# Patient Record
Sex: Female | Born: 1940 | Race: White | Hispanic: No | Marital: Married | Smoking: Never smoker
Health system: Southern US, Community
[De-identification: ages and names within clinical notes are randomized; demographics above are authoritative.]

## PROBLEM LIST (undated history)

## (undated) DIAGNOSIS — I509 Heart failure, unspecified: Secondary | ICD-10-CM

## (undated) DIAGNOSIS — E119 Type 2 diabetes mellitus without complications: Secondary | ICD-10-CM

## (undated) DIAGNOSIS — C801 Malignant (primary) neoplasm, unspecified: Secondary | ICD-10-CM

## (undated) DIAGNOSIS — E782 Mixed hyperlipidemia: Secondary | ICD-10-CM

## (undated) DIAGNOSIS — E669 Obesity, unspecified: Secondary | ICD-10-CM

## (undated) DIAGNOSIS — I499 Cardiac arrhythmia, unspecified: Secondary | ICD-10-CM

## (undated) DIAGNOSIS — R911 Solitary pulmonary nodule: Secondary | ICD-10-CM

## (undated) DIAGNOSIS — K922 Gastrointestinal hemorrhage, unspecified: Secondary | ICD-10-CM

## (undated) DIAGNOSIS — I10 Essential (primary) hypertension: Secondary | ICD-10-CM

## (undated) DIAGNOSIS — I5032 Chronic diastolic (congestive) heart failure: Secondary | ICD-10-CM

## (undated) DIAGNOSIS — N183 Chronic kidney disease, stage 3 unspecified: Secondary | ICD-10-CM

## (undated) HISTORY — DX: Chronic diastolic (congestive) heart failure: I50.32

## (undated) HISTORY — DX: Chronic kidney disease, stage 3 unspecified: N18.30

## (undated) HISTORY — DX: Gastrointestinal hemorrhage, unspecified: K92.2

## (undated) HISTORY — PX: ABDOMINAL HYSTERECTOMY: SHX81

## (undated) HISTORY — DX: Mixed hyperlipidemia: E78.2

## (undated) HISTORY — DX: Obesity, unspecified: E66.9

## (undated) HISTORY — DX: Solitary pulmonary nodule: R91.1

---

## 2005-03-14 ENCOUNTER — Ambulatory Visit: Payer: Self-pay | Admitting: Family Medicine

## 2006-06-19 ENCOUNTER — Ambulatory Visit: Payer: Self-pay | Admitting: Family Medicine

## 2007-08-06 ENCOUNTER — Ambulatory Visit: Payer: Self-pay | Admitting: Family Medicine

## 2008-06-23 ENCOUNTER — Ambulatory Visit: Payer: Self-pay | Admitting: Unknown Physician Specialty

## 2008-08-06 ENCOUNTER — Ambulatory Visit: Payer: Self-pay | Admitting: Family Medicine

## 2009-09-16 ENCOUNTER — Ambulatory Visit: Payer: Self-pay | Admitting: Family Medicine

## 2010-05-27 ENCOUNTER — Ambulatory Visit: Payer: Self-pay | Admitting: Unknown Physician Specialty

## 2010-09-19 ENCOUNTER — Ambulatory Visit: Payer: Self-pay | Admitting: Family Medicine

## 2011-01-11 ENCOUNTER — Ambulatory Visit: Payer: Self-pay | Admitting: Unknown Physician Specialty

## 2011-06-13 HISTORY — PX: BREAST BIOPSY: SHX20

## 2011-10-04 ENCOUNTER — Ambulatory Visit: Payer: Self-pay | Admitting: Family Medicine

## 2011-10-17 ENCOUNTER — Ambulatory Visit: Payer: Self-pay | Admitting: Family Medicine

## 2011-10-26 ENCOUNTER — Ambulatory Visit: Payer: Self-pay | Admitting: Surgery

## 2011-11-07 ENCOUNTER — Ambulatory Visit: Payer: Self-pay | Admitting: Surgery

## 2011-11-10 ENCOUNTER — Ambulatory Visit: Payer: Self-pay | Admitting: Surgery

## 2012-05-06 ENCOUNTER — Ambulatory Visit: Payer: Self-pay | Admitting: Surgery

## 2013-05-13 ENCOUNTER — Ambulatory Visit: Payer: Self-pay | Admitting: Family Medicine

## 2013-11-28 ENCOUNTER — Emergency Department: Payer: Self-pay | Admitting: Emergency Medicine

## 2013-12-03 ENCOUNTER — Ambulatory Visit: Payer: Self-pay | Admitting: General Practice

## 2013-12-08 ENCOUNTER — Ambulatory Visit: Payer: Self-pay | Admitting: General Practice

## 2014-05-14 ENCOUNTER — Ambulatory Visit: Payer: Self-pay | Admitting: Family Medicine

## 2014-10-03 NOTE — Op Note (Signed)
PATIENT NAME:  Tammie [name], Tammie [name] MR#:  [id number] DATE OF BIRTH:  [birth year]  DATE OF PROCEDURE:  12/08/2013  PREOPERATIVE DIAGNOSIS: Left patella fracture.   POSTOPERATIVE DIAGNOSIS: Left patella fracture.   PROCEDURE PERFORMED: Open reduction internal fixation of left patella fracture using tension band wire technique.   SURGEON: Skip Estimable, MD   ANESTHESIA: General.   ESTIMATED BLOOD LOSS: 50 mL.   FLUIDS REPLACED: 950 mL of crystalloid.   DRAINS: None.   IMPLANTS UTILIZED: Two 2.0 mm K wires and a 1.25 mm wire.   INDICATIONS FOR SURGERY: The patient is a 74 year old female who fell and landed directly on the anterior aspect of her left knee. X-rays demonstrated a displaced left patella fracture. After discussion of the risks and benefits of surgical intervention, the patient expressed understanding of the risks and benefits and agreed with plans for surgical intervention.   PROCEDURE IN DETAIL: The patient was brought into the operating room and, after adequate general anesthesia was achieved, a tourniquet was placed on the patient's upper left thigh. The patient's left knee and leg were cleaned and prepped with alcohol and DuraPrep and draped in the usual sterile fashion. A "timeout" was performed as per usual protocol. The left lower extremity was exsanguinated using an Esmarch, and the tourniquet was inflated to 300 mmHg. An anterior longitudinal incision was made. Dissection was carried down to the patella. The fracture site was palpated and opened so as to evacuate hematoma. There was not gross retinacular tear either medially or laterally. Provisional reduction of the patella fracture was performed and position was maintained using bone reduction forceps with points. Congruency of the articular surface was verified using the C-arm. Two 2.0 mm K wires were inserted in an antegrade fashion. Position was confirmed using the C-arm. A 1.25 mm wire was then positioned in a  figure-of-eight fashion so as to create a tension band wire construct. Again, position was confirmed in both AP and lateral planes. Excellent maintenance of the reduction was appreciated with good congruency of the articular surface. The K wires were bent and countersunk with the distal aspect of the wires cut and bent so as to avoid soft tissue impingement. Tourniquet was deflated after total tourniquet time of 59 minutes. Hemostasis was achieved using electrocautery. The tissue over the fracture was repaired using interrupted sutures of #1 Ethibond. The subcutaneous tissue was approximated in layers using first #0 Vicryl followed by #2-0 Vicryl. Then, 30 mL of 0.25% Marcaine was injected in the subcutaneous tissue in line with the skin incision. Skin was closed with skin staples. A sterile dressing was applied followed by application of a knee brace locked in extension.   The patient tolerated the procedure well. She was transported to the recovery room in stable condition.  ____________________________ Laurice Record. Holley Bouche., MD [email or web address] D: 12/09/2013 00:52:58 ET T: 12/09/2013 07:43:02 ET JOB#: [id number]  cc: Laurice Record. Holley Bouche., MD, <Dictator> JAMES P Holley Bouche MD ELECTRONICALLY SIGNED 12/13/2013 12:26

## 2014-10-04 NOTE — Op Note (Signed)
PATIENT NAME:  Tammie [name], Tammie [name] MR#:  [id number] DATE OF BIRTH:  05/08/1941  DATE OF PROCEDURE:  11/10/2011  PREOPERATIVE DIAGNOSIS: Left breast mass.   POSTOPERATIVE DIAGNOSIS: Left breast mass.  PROCEDURE: Excision of left breast mass.   ANESTHESIA: General.   INDICATIONS: This 74 year old female recently had a mammogram and ultrasound. Her ultrasound demonstrated multiple cysts and also in the retro areola area there was a partially cystic, partially solid nodule posterior to the nipple some 6.8 x 4.6 cm in dimension, somewhat irregular and it appeared to raise some suspicion for her cancer. She had preoperative ultrasound-guided insertion of a Kopans wire. These ultrasound images were reviewed seeing that the thicker end extended through this nodule.  DESCRIPTION OF PROCEDURE: The patient was placed on the operating table in the supine position under general anesthesia. The dressing was removed from the left breast exposing the Kopans wire which entered the breast at the 3 o'clock position several centimeters lateral to the areola. The wire was cut 3 cm from the skin. The breast was prepared with ChloraPrep and draped in a sterile manner.   A curvilinear incision was made at the border of the areola from approximately 1 o'clock  to 5 o'clock position of the left breast and carried down through subcutaneous tissues and encountered the wire. Next, a sample of tissue surrounding the thick portion of the wire extending from lateral to medial was excised. This portion of tissue was approximately 1 x 1 x 2 cm in dimension. There was some firmness. There were several small cysts which were drained during the course of the dissection. The tissue was submitted in formalin for routine pathology. The wound was inspected. There was no remaining palpable hard mass within the wound. Several small bleeding points were cauterized. Hemostasis was subsequently intact. Tissues      surrounding the biopsy  site were approximated with 4-0 chromic. The skin was closed with running 4-0 chromic subcuticular suture and Dermabond. The patient tolerated surgery satisfactorily and was then prepared for transfer to the recovery room. ____________________________ Lenna Sciara. Rochel Brome, MD [email or web address] D: 11/10/2011 11:41:19 ET T: 11/10/2011 11:56:29 ET JOB#: [id number]  cc: Loreli Dollar, MD, <Dictator> Loreli Dollar MD ELECTRONICALLY SIGNED 11/10/2011 18:42

## 2015-05-12 ENCOUNTER — Other Ambulatory Visit: Payer: Self-pay | Admitting: Family Medicine

## 2015-05-12 DIAGNOSIS — Z1231 Encounter for screening mammogram for malignant neoplasm of breast: Secondary | ICD-10-CM

## 2015-05-19 ENCOUNTER — Ambulatory Visit
Admission: RE | Admit: 2015-05-19 | Discharge: 2015-05-19 | Disposition: A | Discharge status: Home / Self Care | Payer: Medicare Other | Source: Ambulatory Visit | Attending: Family Medicine | Admitting: Family Medicine

## 2015-05-19 DIAGNOSIS — Z1231 Encounter for screening mammogram for malignant neoplasm of breast: Secondary | ICD-10-CM | POA: Diagnosis present

## 2016-04-10 ENCOUNTER — Other Ambulatory Visit: Payer: Self-pay | Admitting: Family Medicine

## 2016-04-10 DIAGNOSIS — Z1231 Encounter for screening mammogram for malignant neoplasm of breast: Secondary | ICD-10-CM

## 2016-05-22 ENCOUNTER — Ambulatory Visit
Admission: RE | Admit: 2016-05-22 | Discharge: 2016-05-22 | Disposition: A | Discharge status: Home / Self Care | Payer: Medicare Other | Source: Ambulatory Visit | Attending: Family Medicine | Admitting: Family Medicine

## 2016-05-22 DIAGNOSIS — Z1231 Encounter for screening mammogram for malignant neoplasm of breast: Secondary | ICD-10-CM

## 2016-05-22 HISTORY — DX: Malignant (primary) neoplasm, unspecified: C80.1

## 2017-10-31 ENCOUNTER — Other Ambulatory Visit: Payer: Self-pay | Admitting: Family Medicine

## 2017-10-31 DIAGNOSIS — N6489 Other specified disorders of breast: Secondary | ICD-10-CM

## 2017-11-16 ENCOUNTER — Ambulatory Visit
Admission: RE | Admit: 2017-11-16 | Discharge: 2017-11-16 | Disposition: A | Discharge status: Home / Self Care | Payer: Medicare HMO | Source: Ambulatory Visit | Attending: Family Medicine | Admitting: Family Medicine

## 2017-11-16 DIAGNOSIS — N6489 Other specified disorders of breast: Secondary | ICD-10-CM | POA: Insufficient documentation

## 2018-11-18 ENCOUNTER — Other Ambulatory Visit: Payer: Self-pay | Admitting: Family Medicine

## 2018-11-18 DIAGNOSIS — Z1231 Encounter for screening mammogram for malignant neoplasm of breast: Secondary | ICD-10-CM

## 2018-12-30 ENCOUNTER — Ambulatory Visit
Admission: RE | Admit: 2018-12-30 | Discharge: 2018-12-30 | Disposition: A | Discharge status: Home / Self Care | Payer: Medicare HMO | Source: Ambulatory Visit | Attending: Family Medicine | Admitting: Family Medicine

## 2018-12-30 ENCOUNTER — Other Ambulatory Visit: Payer: Self-pay

## 2018-12-30 DIAGNOSIS — Z1231 Encounter for screening mammogram for malignant neoplasm of breast: Secondary | ICD-10-CM | POA: Diagnosis present

## 2018-12-31 ENCOUNTER — Other Ambulatory Visit: Payer: Self-pay | Admitting: Family Medicine

## 2018-12-31 DIAGNOSIS — R928 Other abnormal and inconclusive findings on diagnostic imaging of breast: Secondary | ICD-10-CM

## 2018-12-31 DIAGNOSIS — R921 Mammographic calcification found on diagnostic imaging of breast: Secondary | ICD-10-CM

## 2019-01-08 ENCOUNTER — Ambulatory Visit
Admission: RE | Admit: 2019-01-08 | Discharge: 2019-01-08 | Disposition: A | Discharge status: Home / Self Care | Payer: Medicare HMO | Source: Ambulatory Visit | Attending: Family Medicine | Admitting: Family Medicine

## 2019-01-08 DIAGNOSIS — R928 Other abnormal and inconclusive findings on diagnostic imaging of breast: Secondary | ICD-10-CM

## 2019-01-08 DIAGNOSIS — R921 Mammographic calcification found on diagnostic imaging of breast: Secondary | ICD-10-CM

## 2019-12-01 ENCOUNTER — Other Ambulatory Visit
Admission: RE | Admit: 2019-12-01 | Discharge: 2019-12-01 | Disposition: A | Discharge status: Home / Self Care | Payer: Medicare HMO | Source: Ambulatory Visit | Attending: Family Medicine | Admitting: Family Medicine

## 2019-12-01 DIAGNOSIS — R600 Localized edema: Secondary | ICD-10-CM | POA: Diagnosis not present

## 2019-12-01 DIAGNOSIS — R635 Abnormal weight gain: Secondary | ICD-10-CM | POA: Diagnosis present

## 2019-12-01 DIAGNOSIS — I10 Essential (primary) hypertension: Secondary | ICD-10-CM | POA: Diagnosis not present

## 2019-12-01 DIAGNOSIS — R079 Chest pain, unspecified: Secondary | ICD-10-CM | POA: Diagnosis not present

## 2019-12-01 LAB — BRAIN NATRIURETIC PEPTIDE: B Natriuretic Peptide: 139.2 pg/mL — ABNORMAL HIGH (ref 0.0–100.0)

## 2019-12-01 LAB — TROPONIN I (HIGH SENSITIVITY): Troponin I (High Sensitivity): 13 ng/L (ref <18)

## 2019-12-01 LAB — FIBRIN DERIVATIVES D-DIMER (ARMC ONLY): Fibrin derivatives D-dimer (ARMC): 1768.73 ng/mL (FEU) — ABNORMAL HIGH (ref 0.00–499.00)

## 2019-12-02 ENCOUNTER — Ambulatory Visit
Admission: RE | Admit: 2019-12-02 | Discharge: 2019-12-02 | Disposition: A | Discharge status: Home / Self Care | Payer: Medicare HMO | Source: Ambulatory Visit | Attending: Internal Medicine | Admitting: Internal Medicine

## 2019-12-02 ENCOUNTER — Other Ambulatory Visit: Payer: Self-pay | Admitting: Internal Medicine

## 2019-12-02 ENCOUNTER — Other Ambulatory Visit: Payer: Self-pay

## 2019-12-02 ENCOUNTER — Other Ambulatory Visit: Payer: Self-pay | Admitting: Family Medicine

## 2019-12-02 ENCOUNTER — Ambulatory Visit
Admission: RE | Admit: 2019-12-02 | Discharge: 2019-12-02 | Disposition: A | Discharge status: Home / Self Care | Payer: Medicare HMO | Source: Ambulatory Visit | Attending: Family Medicine | Admitting: Family Medicine

## 2019-12-02 ENCOUNTER — Other Ambulatory Visit (HOSPITAL_COMMUNITY): Payer: Self-pay | Admitting: Internal Medicine

## 2019-12-02 DIAGNOSIS — R635 Abnormal weight gain: Secondary | ICD-10-CM | POA: Diagnosis present

## 2019-12-02 DIAGNOSIS — R7989 Other specified abnormal findings of blood chemistry: Secondary | ICD-10-CM

## 2019-12-02 DIAGNOSIS — R600 Localized edema: Secondary | ICD-10-CM

## 2019-12-02 HISTORY — DX: Essential (primary) hypertension: I10

## 2019-12-02 HISTORY — DX: Type 2 diabetes mellitus without complications: E11.9

## 2019-12-02 MED ORDER — TECHNETIUM TO 99M ALBUMIN AGGREGATED
4.0000 | Freq: Once | INTRAVENOUS | Status: AC | PRN
  Administered 2019-12-02: 4.053 via INTRAVENOUS

## 2019-12-08 ENCOUNTER — Other Ambulatory Visit: Payer: Self-pay

## 2019-12-08 ENCOUNTER — Emergency Department: Payer: Medicare HMO

## 2019-12-08 ENCOUNTER — Encounter: Payer: Self-pay | Admitting: Emergency Medicine

## 2019-12-08 ENCOUNTER — Inpatient Hospital Stay
Admission: EM | Admit: 2019-12-08 | Discharge: 2019-12-16 | DRG: 291 | Disposition: A | Discharge status: Home Health | Payer: Medicare HMO | Attending: Internal Medicine | Admitting: Internal Medicine

## 2019-12-08 ENCOUNTER — Inpatient Hospital Stay (HOSPITAL_COMMUNITY)
Admit: 2019-12-08 | Discharge: 2019-12-08 | Disposition: A | Discharge status: Home / Self Care | Payer: Medicare HMO | Attending: Internal Medicine | Admitting: Internal Medicine

## 2019-12-08 DIAGNOSIS — Z79890 Hormone replacement therapy (postmenopausal): Secondary | ICD-10-CM

## 2019-12-08 DIAGNOSIS — L8990 Pressure ulcer of unspecified site, unspecified stage: Secondary | ICD-10-CM | POA: Diagnosis present

## 2019-12-08 DIAGNOSIS — I248 Other forms of acute ischemic heart disease: Secondary | ICD-10-CM | POA: Diagnosis present

## 2019-12-08 DIAGNOSIS — I5033 Acute on chronic diastolic (congestive) heart failure: Secondary | ICD-10-CM | POA: Diagnosis present

## 2019-12-08 DIAGNOSIS — Z20822 Contact with and (suspected) exposure to covid-19: Secondary | ICD-10-CM | POA: Diagnosis present

## 2019-12-08 DIAGNOSIS — E662 Morbid (severe) obesity with alveolar hypoventilation: Secondary | ICD-10-CM | POA: Diagnosis present

## 2019-12-08 DIAGNOSIS — R778 Other specified abnormalities of plasma proteins: Secondary | ICD-10-CM | POA: Diagnosis not present

## 2019-12-08 DIAGNOSIS — I959 Hypotension, unspecified: Secondary | ICD-10-CM | POA: Diagnosis present

## 2019-12-08 DIAGNOSIS — J9602 Acute respiratory failure with hypercapnia: Secondary | ICD-10-CM | POA: Diagnosis present

## 2019-12-08 DIAGNOSIS — I472 Ventricular tachycardia: Secondary | ICD-10-CM | POA: Diagnosis not present

## 2019-12-08 DIAGNOSIS — Z888 Allergy status to other drugs, medicaments and biological substances status: Secondary | ICD-10-CM

## 2019-12-08 DIAGNOSIS — Z880 Allergy status to penicillin: Secondary | ICD-10-CM | POA: Diagnosis not present

## 2019-12-08 DIAGNOSIS — J9601 Acute respiratory failure with hypoxia: Secondary | ICD-10-CM | POA: Diagnosis present

## 2019-12-08 DIAGNOSIS — Z6841 Body Mass Index (BMI) 40.0 and over, adult: Secondary | ICD-10-CM | POA: Diagnosis not present

## 2019-12-08 DIAGNOSIS — M19079 Primary osteoarthritis, unspecified ankle and foot: Secondary | ICD-10-CM | POA: Diagnosis present

## 2019-12-08 DIAGNOSIS — J90 Pleural effusion, not elsewhere classified: Secondary | ICD-10-CM

## 2019-12-08 DIAGNOSIS — N179 Acute kidney failure, unspecified: Secondary | ICD-10-CM | POA: Diagnosis present

## 2019-12-08 DIAGNOSIS — I110 Hypertensive heart disease with heart failure: Secondary | ICD-10-CM | POA: Diagnosis present

## 2019-12-08 DIAGNOSIS — E11649 Type 2 diabetes mellitus with hypoglycemia without coma: Secondary | ICD-10-CM | POA: Diagnosis present

## 2019-12-08 DIAGNOSIS — Z882 Allergy status to sulfonamides status: Secondary | ICD-10-CM | POA: Diagnosis not present

## 2019-12-08 DIAGNOSIS — Z7982 Long term (current) use of aspirin: Secondary | ICD-10-CM

## 2019-12-08 DIAGNOSIS — E785 Hyperlipidemia, unspecified: Secondary | ICD-10-CM | POA: Diagnosis present

## 2019-12-08 DIAGNOSIS — E119 Type 2 diabetes mellitus without complications: Secondary | ICD-10-CM

## 2019-12-08 DIAGNOSIS — J9690 Respiratory failure, unspecified, unspecified whether with hypoxia or hypercapnia: Secondary | ICD-10-CM | POA: Diagnosis present

## 2019-12-08 DIAGNOSIS — Z79899 Other long term (current) drug therapy: Secondary | ICD-10-CM | POA: Diagnosis not present

## 2019-12-08 DIAGNOSIS — I272 Pulmonary hypertension, unspecified: Secondary | ICD-10-CM | POA: Diagnosis present

## 2019-12-08 DIAGNOSIS — Z794 Long term (current) use of insulin: Secondary | ICD-10-CM | POA: Diagnosis not present

## 2019-12-08 DIAGNOSIS — I5030 Unspecified diastolic (congestive) heart failure: Secondary | ICD-10-CM | POA: Diagnosis present

## 2019-12-08 DIAGNOSIS — I361 Nonrheumatic tricuspid (valve) insufficiency: Secondary | ICD-10-CM | POA: Diagnosis not present

## 2019-12-08 DIAGNOSIS — R0689 Other abnormalities of breathing: Secondary | ICD-10-CM | POA: Diagnosis present

## 2019-12-08 DIAGNOSIS — I5023 Acute on chronic systolic (congestive) heart failure: Secondary | ICD-10-CM | POA: Diagnosis not present

## 2019-12-08 DIAGNOSIS — I10 Essential (primary) hypertension: Secondary | ICD-10-CM | POA: Diagnosis not present

## 2019-12-08 DIAGNOSIS — I071 Rheumatic tricuspid insufficiency: Secondary | ICD-10-CM | POA: Diagnosis present

## 2019-12-08 LAB — BASIC METABOLIC PANEL
Anion gap: 10 (ref 5–15)
BUN: 41 mg/dL — ABNORMAL HIGH (ref 8–23)
CO2: 40 mmol/L — ABNORMAL HIGH (ref 22–32)
Calcium: 9.2 mg/dL (ref 8.9–10.3)
Chloride: 83 mmol/L — ABNORMAL LOW (ref 98–111)
Creatinine, Ser: 1.16 mg/dL — ABNORMAL HIGH (ref 0.44–1.00)
GFR calc Af Amer: 52 mL/min — ABNORMAL LOW (ref >60)
GFR calc non Af Amer: 45 mL/min — ABNORMAL LOW (ref >60)
Glucose, Bld: 122 mg/dL — ABNORMAL HIGH (ref 70–99)
Potassium: 4.4 mmol/L (ref 3.5–5.1)
Sodium: 133 mmol/L — ABNORMAL LOW (ref 135–145)

## 2019-12-08 LAB — CBC
HCT: 40.2 % (ref 36.0–46.0)
Hemoglobin: 12 g/dL (ref 12.0–15.0)
MCH: 25.8 pg — ABNORMAL LOW (ref 26.0–34.0)
MCHC: 29.9 g/dL — ABNORMAL LOW (ref 30.0–36.0)
MCV: 86.3 fL (ref 80.0–100.0)
Platelets: 431 10*3/uL — ABNORMAL HIGH (ref 150–400)
RBC: 4.66 MIL/uL (ref 3.87–5.11)
RDW: 15 % (ref 11.5–15.5)
WBC: 10.8 10*3/uL — ABNORMAL HIGH (ref 4.0–10.5)
nRBC: 0 % (ref 0.0–0.2)

## 2019-12-08 LAB — GLUCOSE, CAPILLARY
Glucose-Capillary: 144 mg/dL — ABNORMAL HIGH (ref 70–99)
Glucose-Capillary: 44 mg/dL — CL (ref 70–99)
Glucose-Capillary: 61 mg/dL — ABNORMAL LOW (ref 70–99)
Glucose-Capillary: 95 mg/dL (ref 70–99)

## 2019-12-08 LAB — HEPATIC FUNCTION PANEL
ALT: 13 U/L (ref 0–44)
AST: 16 U/L (ref 15–41)
Albumin: 3.4 g/dL — ABNORMAL LOW (ref 3.5–5.0)
Alkaline Phosphatase: 50 U/L (ref 38–126)
Bilirubin, Direct: <0.1 mg/dL (ref 0.0–0.2)
Total Bilirubin: 0.6 mg/dL (ref 0.3–1.2)
Total Protein: 6.5 g/dL (ref 6.5–8.1)

## 2019-12-08 LAB — PROCALCITONIN: Procalcitonin: <0.1 ng/mL

## 2019-12-08 LAB — BRAIN NATRIURETIC PEPTIDE: B Natriuretic Peptide: 395.6 pg/mL — ABNORMAL HIGH (ref 0.0–100.0)

## 2019-12-08 LAB — LACTIC ACID, PLASMA: Lactic Acid, Venous: 1.9 mmol/L (ref 0.5–1.9)

## 2019-12-08 LAB — SARS CORONAVIRUS 2 BY RT PCR (HOSPITAL ORDER, PERFORMED IN ~~LOC~~ HOSPITAL LAB): SARS Coronavirus 2: NEGATIVE

## 2019-12-08 LAB — MRSA PCR SCREENING: MRSA by PCR: NEGATIVE

## 2019-12-08 LAB — TROPONIN I (HIGH SENSITIVITY)
Troponin I (High Sensitivity): 36 ng/L — ABNORMAL HIGH (ref <18)
Troponin I (High Sensitivity): 39 ng/L — ABNORMAL HIGH (ref <18)

## 2019-12-08 MED ORDER — POLYETHYLENE GLYCOL 3350 17 G PO PACK: 17.0000 g | PACK | Freq: Every day | ORAL | Status: DC | PRN

## 2019-12-08 MED ORDER — ENOXAPARIN SODIUM 40 MG/0.4ML ~~LOC~~ SOLN
40.0000 mg | Freq: Two times a day (BID) | SUBCUTANEOUS | Status: DC
  Administered 2019-12-08 – 2019-12-16 (×16): 40 mg via SUBCUTANEOUS
  Filled 2019-12-08 (×16): qty 0.4

## 2019-12-08 MED ORDER — DOCUSATE SODIUM 100 MG PO CAPS: 100.0000 mg | ORAL_CAPSULE | Freq: Two times a day (BID) | ORAL | Status: DC | PRN

## 2019-12-08 MED ORDER — METHYLPREDNISOLONE SODIUM SUCC 125 MG IJ SOLR
125.0000 mg | Freq: Once | INTRAMUSCULAR | Status: AC
  Administered 2019-12-08: 125 mg via INTRAVENOUS
  Filled 2019-12-08: qty 2

## 2019-12-08 MED ORDER — IPRATROPIUM-ALBUTEROL 0.5-2.5 (3) MG/3ML IN SOLN
3.0000 mL | Freq: Once | RESPIRATORY_TRACT | Status: AC
  Administered 2019-12-08: 3 mL via RESPIRATORY_TRACT
  Filled 2019-12-08: qty 3

## 2019-12-08 MED ORDER — SODIUM CHLORIDE 0.9 % IV SOLN: Freq: Once | INTRAVENOUS | Status: DC

## 2019-12-08 MED ORDER — IPRATROPIUM-ALBUTEROL 0.5-2.5 (3) MG/3ML IN SOLN: 3.0000 mL | Freq: Once | RESPIRATORY_TRACT | Status: DC

## 2019-12-08 MED ORDER — ONDANSETRON HCL 4 MG/2ML IJ SOLN: 4.0000 mg | Freq: Four times a day (QID) | INTRAMUSCULAR | Status: DC | PRN

## 2019-12-08 MED ORDER — FAMOTIDINE IN NACL 20-0.9 MG/50ML-% IV SOLN
20.0000 mg | Freq: Two times a day (BID) | INTRAVENOUS | Status: DC
  Administered 2019-12-08 – 2019-12-10 (×5): 20 mg via INTRAVENOUS
  Filled 2019-12-08 (×5): qty 50

## 2019-12-08 MED ORDER — SODIUM CHLORIDE 0.9 % IV SOLN
250.0000 mL | INTRAVENOUS | Status: DC | PRN
  Administered 2019-12-08 – 2019-12-14 (×3): 250 mL via INTRAVENOUS

## 2019-12-08 MED ORDER — DEXTROSE 50 % IV SOLN
1.0000 | Freq: Once | INTRAVENOUS | Status: DC
  Filled 2019-12-08: qty 50

## 2019-12-08 MED ORDER — SODIUM CHLORIDE 0.9% FLUSH: 3.0000 mL | INTRAVENOUS | Status: DC | PRN

## 2019-12-08 MED ORDER — SODIUM CHLORIDE 0.9 % IV BOLUS: 250.0000 mL | Freq: Once | INTRAVENOUS | Status: DC

## 2019-12-08 MED ORDER — DEXTROSE 50 % IV SOLN
1.0000 | Freq: Once | INTRAVENOUS | Status: AC
  Administered 2019-12-08: 50 mL via INTRAVENOUS

## 2019-12-08 MED ORDER — ACETAMINOPHEN 325 MG PO TABS: 650.0000 mg | ORAL_TABLET | ORAL | Status: DC | PRN

## 2019-12-08 MED ORDER — SODIUM CHLORIDE 0.9% FLUSH
3.0000 mL | Freq: Two times a day (BID) | INTRAVENOUS | Status: DC
  Administered 2019-12-08 – 2019-12-16 (×17): 3 mL via INTRAVENOUS

## 2019-12-08 MED ORDER — IPRATROPIUM-ALBUTEROL 0.5-2.5 (3) MG/3ML IN SOLN
3.0000 mL | Freq: Once | RESPIRATORY_TRACT | Status: DC
  Filled 2019-12-08: qty 3

## 2019-12-08 NOTE — H&P (Signed)
Name: ADYN HOES MRN: [id number] DOB: 1940-07-24     CONSULTATION DATE: 12/08/2019  REFERRING MD : Quentin Cornwall  CHIEF COMPLAINT: SOB  STUDIES:      CT chest Independently reviewed by Me today B/l effusions L>R  HISTORY OF PRESENT ILLNESS:  79 y.o. female is a ER for evaluation of 6 weeks of worsening shortness of breath and fatigue.    -Endorses diffuse swelling that has been progressively worsening.  -Denies any history of CHF.    Had recent echo that was reassuring but I dontt see ECHO results in results section She Has been placed on Lasix but does not feel that it is a helping.    Denies any fevers or chills at home.  No recent antibiotics.  States that she was having some achy chest discomfort but after being placed on oxygen in triage feels like that pain is resolved.    Not noticed any significant or sudden weight loss.  Denies any abdominal pain.  No back pain.  No dysuria.  Patient has had lower ext swelling and edema and abd distention over last several weeks, patient on biPAP for increased WOB and resp distress  CBC    Component Value Date/Time   WBC 10.8 (H) 12/08/2019 1036   RBC 4.66 12/08/2019 1036   HGB 12.0 12/08/2019 1036   HCT 40.2 12/08/2019 1036   PLT 431 (H) 12/08/2019 1036   MCV 86.3 12/08/2019 1036   MCH 25.8 (L) 12/08/2019 1036   MCHC 29.9 (L) 12/08/2019 1036   RDW 15.0 12/08/2019 1036   BMP Latest Ref Rng & Units 12/08/2019  Glucose 70 - 99 mg/dL 122(H)  BUN 8 - 23 mg/dL 41(H)  Creatinine 0.44 - 1.00 mg/dL 1.16(H)  Sodium 135 - 145 mmol/L 133(L)  Potassium 3.5 - 5.1 mmol/L 4.4  Chloride 98 - 111 mmol/L 83(L)  CO2 22 - 32 mmol/L 40(H)  Calcium 8.9 - 10.3 mg/dL 9.2   7.31/100 TROP 39 BNP 396 PRO CALCITONIN < 0.10 LA 1.9  PAST MEDICAL HISTORY :   has a past medical history of Cancer ([place]), Diabetes mellitus without complication ([place]), and Hypertension.  has a past surgical history that includes Breast biopsy (Left, 2013) and  Abdominal hysterectomy. Prior to Admission medications   Medication Sig Start Date End Date Taking? Authorizing Provider  aspirin EC 81 MG tablet Take 81 mg by mouth daily.   Yes [provider]  Calcium 600-400 MG-UNIT CHEW Chew 1 tablet by mouth 2 (two) times daily.   Yes [provider]  carvedilol (COREG) 6.25 MG tablet Take 6.25 mg by mouth 2 (two) times daily. 11/25/19  Yes [provider]  glimepiride (AMARYL) 2 MG tablet Take 2 mg by mouth daily with breakfast.   Yes [provider]  hydrochlorothiazide (HYDRODIURIL) 25 MG tablet Take 25 mg by mouth daily. 10/24/19  Yes [provider]  insulin detemir (LEVEMIR) 100 UNIT/ML injection Inject 32 Units into the skin 2 (two) times daily.   Yes [provider]  levothyroxine (SYNTHROID) 75 MCG tablet Take 75 mcg by mouth daily. 10/24/19  Yes [provider]  lisinopril (ZESTRIL) 40 MG tablet Take 40 mg by mouth daily. 10/24/19  Yes [provider]  lovastatin (MEVACOR) 20 MG tablet Take 20 mg by mouth at bedtime. 10/24/19  Yes [provider]  metFORMIN (GLUCOPHAGE) 1000 MG tablet Take 1,000 mg by mouth 2 (two) times daily. 10/24/19  Yes [provider]  naproxen sodium (ALEVE) 220  MG tablet Take 220 mg by mouth 2 (two) times daily as needed (pain).   Yes [provider]  omeprazole (PRILOSEC) 20 MG capsule Take 20 mg by mouth daily.   Yes [provider]  torsemide (DEMADEX) 20 MG tablet Take 20 mg by mouth daily. 12/04/19  Yes [provider]   Allergies  Allergen Reactions  . Ampicillin Hives  . Contrast Media [Iodinated Diagnostic Agents] Hives  . Penicillin G Hives  . Sulfamethoxazole-Trimethoprim Hives  . Tetracyclines & Related Hives  . Verapamil Hives  . Amlodipine Rash    FAMILY HISTORY:  family history includes Breast cancer in an other family member; Breast cancer (age of onset: 57) in her sister. SOCIAL HISTORY:   reports that she has never smoked. She has never used smokeless tobacco.    Review of Systems: LIMITED DUE TO RESP DISTRESS ON biPAP Other:  All other systems negative     VITAL SIGNS: Temp:  [99.7 F (37.6 C)] 99.7 F (37.6 C) (06/28 1012) Pulse Rate:  [60-68] 60 (06/28 1442) Resp:  [20-28] 24 (06/28 1442) BP: (117-150)/(62-95) 117/95 (06/28 1442) SpO2:  [87 %-98 %] 92 % (06/28 1442) FiO2 (%):  [28 %] 28 % (06/28 1442) Weight:  [100.2 kg] 100.2 kg (06/28 1013)     SpO2: 92 % O2 Flow Rate (L/min): 2 L/min FiO2 (%): 28 %  PHYSICAL EXAMINATION:  GENERAL:critically ill appearing, +resp distress on BIPAP HEAD: Normocephalic, atraumatic.  EYES: Pupils equal, round, reactive to light.  No scleral icterus.  MOUTH: Moist mucosal membrane. NECK: Supple. No thyromegaly. No nodules. No JVD.  PULMONARY: +rhonchi, CARDIOVASCULAR: S1 and S2. Regular rate and rhythm. No murmurs, rubs, or gallops.  GASTROINTESTINAL: +distended. Positive bowel sounds.  MUSCULOSKELETAL: + edema.  NEUROLOGIC: awake SKIN:intact,warm,dry     MEDICATIONS: I have reviewed all medications and confirmed regimen as documented    CULTURE RESULTS   Recent Results (from the past 240 hour(s))  SARS Coronavirus 2 by RT PCR (hospital order, performed in [facility] hospital lab) Nasopharyngeal Nasopharyngeal Swab     Status: None   Collection Time: 12/08/19 11:25 AM   Specimen: Nasopharyngeal Swab  Result Value Ref Range Status   SARS Coronavirus 2 NEGATIVE NEGATIVE Final    Comment: (NOTE) SARS-CoV-2 target nucleic acids are NOT DETECTED.  The SARS-CoV-2 RNA is generally detectable in upper and lower respiratory specimens during the acute phase of infection. The lowest concentration of SARS-CoV-2 viral copies this assay can detect is 250 copies / mL. A negative result does not preclude SARS-CoV-2 infection and should not be used as the sole basis for treatment or other patient management decisions.   A negative result may occur with improper specimen collection / handling, submission of specimen other than nasopharyngeal swab, presence of viral mutation(s) within the areas targeted by this assay, and inadequate number of viral copies (<250 copies / mL). A negative result must be combined with clinical observations, patient history, and epidemiological information.  Fact Sheet for Patients:   StrictlyIdeas.no  Fact Sheet for Healthcare Providers: BankingDealers.co.za  This test is not yet approved or  cleared by the [place] FDA and has been authorized for detection and/or diagnosis of SARS-CoV-2 by FDA under an Emergency Use Authorization (EUA).  This EUA will remain in effect (meaning this test can be used) for the duration of the COVID-19 declaration under Section 564(b)(1) of the Act, 21 U.S.C. section 360bbb-3(b)(1), unless the authorization is terminated or revoked sooner.  Performed at [facility], [address] [place], [place] [address]           IMAGING    DG Chest 2 View  Result Date: 12/08/2019 CLINICAL DATA:  Shortness of breath EXAM: CHEST - 2 VIEW COMPARISON:  December 02, 2019 FINDINGS: There is persistent airspace opacity with pleural effusion at the left base. There is a smaller right pleural effusion with ill-defined airspace opacity in the right base. Heart size and pulmonary vascularity are normal. No adenopathy. No bone lesions. IMPRESSION: Pleural effusions bilaterally, larger on the left than on the right ill-defined airspace opacity in the lung bases, more on the left than on the right. Changes on the right are new compared to most recent study. Changes on the left are similar. Stable cardiac silhouette. Electronically Signed   By: Lowella Grip III M.D.   On: 12/08/2019 11:01   CT Chest Wo Contrast  Result Date: 12/08/2019 CLINICAL DATA:  Follow-up abnormal chest x-ray EXAM: CT CHEST  WITHOUT CONTRAST TECHNIQUE: Multidetector CT imaging of the chest was performed following the standard protocol without IV contrast. COMPARISON:  Chest x-ray from earlier in the same day. FINDINGS: Cardiovascular: Somewhat limited due to lack of IV contrast. Aortic calcifications are noted without aneurysmal dilatation. Heart is at the upper limits of normal in size. Coronary calcifications are seen. Mediastinum/Nodes: Thoracic inlet is within normal limits. No hilar or mediastinal adenopathy is noted. Noted the esophagus as visualized is within normal limits. Lungs/Pleura: Bilateral pleural effusions are noted left slightly greater than right with some associated lower lobe atelectatic changes identified again left greater than right. Upper Abdomen: Visualized upper abdomen shows no acute abnormality. Musculoskeletal: Degenerative changes of the thoracic spine are noted. Chronic compression deformity at T9. IMPRESSION: Bilateral pleural effusions left greater than right with associated atelectatic changes. T9 compression deformity which appears chronic with increased sclerosis. No other focal abnormality is noted. Aortic Atherosclerosis (ICD10-I70.0). Electronically Signed   By: Inez Catalina M.D.   On: 12/08/2019 13:10       ASSESSMENT AND PLAN SYNOPSIS  79 yo WF with morbid obesity admitted for acute resp distress with acute hypercapnic resp failure with acute b/l effusions and pulm edema with generalized anasarca, at this time, I am concerned about possible acute systolic/diastolic heart failure, with probable liver failure and renal failure associated with severe hypoglycemia  Severe ACUTE Hypoxic and Hypercapnic Respiratory Failure On biPAP High risk for intubation  ACUTE SYSTOLIC CARDIAC FAILURE- ECHO pending -oxygen as needed -Lasix as tolerated -follow up cardiac enzymes as indicated  PROBABLE LIVER DYSFUNCTION CT ABD PELVIS  ACUTE KIDNEY INJURY/Renal Failure -continue Foley  Catheter-assess need -Avoid nephrotoxic agents -Follow urine output, BMP -Ensure adequate renal perfusion, optimize oxygenation -Renal dose medications   ELECTROLYTES -follow labs as needed -replace as needed -pharmacy consultation and following   DVT/GI PRX ordered and assessed TRANSFUSIONS AS NEEDED MONITOR FSBS I Assessed the need for Labs I Assessed the need for Foley I Assessed the need for Central Venous Line Family Discussion when available I Assessed the need for Mobilization I made an Assessment of medications to be adjusted accordingly Safety Risk assessment completed   Prognosis is guarded.   Critical Care Time devoted to patient care services described in this note is 45 minutes.   Overall, patient is critically ill, prognosis is guarded.     Corrin Parker, M.D.  [facility]  Medical Director [place] Director [facility] Cardio-Pulmonary Department

## 2019-12-08 NOTE — Progress Notes (Signed)
Received report from [facility] in ED, awaiting patient to come to room 14.

## 2019-12-08 NOTE — Care Management Note (Signed)
Had moment of desaturation while sleeping, increased FIO2 to 32% now saturation is at 90%

## 2019-12-08 NOTE — Progress Notes (Signed)
Patient resting quietly, requested meal earlier, but now appears to be sleeping with unlabored respirations, tolerating bipap well. Will continue to monitor

## 2019-12-08 NOTE — Progress Notes (Signed)
*  PRELIMINARY RESULTS* Echocardiogram 2D Echocardiogram has been performed.  Tammie [name] 12/08/2019, 8:48 PM

## 2019-12-08 NOTE — Progress Notes (Signed)
Anticoagulation monitoring(Lovenox):  79yo  female ordered Lovenox 40 mg Q24h  Filed Weights   12/08/19 1013  Weight: 100.2 kg (221 lb)   BMI 43.16   Lab Results  Component Value Date   CREATININE 1.16 (H) 12/08/2019   Estimated Creatinine Clearance: 41.8 mL/min (A) (by C-G formula based on SCr of 1.16 mg/dL (H)). Hemoglobin & Hematocrit     Component Value Date/Time   HGB 12.0 12/08/2019 1036   HCT 40.2 12/08/2019 1036     Per Protocol for Patient with estCrcl > 30 ml/min and BMI > 40, will transition to Lovenox 40 mg BID

## 2019-12-08 NOTE — ED Triage Notes (Signed)
Patient presents to the ED with low oxygen saturation levels at home.  Patient states they were in the 7s.  Patient reports history of problems with breathing.  Patient denies history of copd.  Patient reports history of diabetes.  Patient states she began to feel short of breath 2 weeks ago.  Patient denies fever or cough.  Patient states 1 week ago she was told she had fluid around her lung.

## 2019-12-08 NOTE — Progress Notes (Signed)
Son at bedside, took all belongings home per patient request.

## 2019-12-08 NOTE — ED Provider Notes (Signed)
[facility] Emergency Department Provider Note    First MD Initiated Contact with Patient 12/08/19 1042     (approximate)  I have reviewed the triage vital signs and the nursing notes.   HISTORY  Chief Complaint Shortness of Breath    HPI Tammie [name] is a 79 y.o. female is a ER for evaluation of 6 weeks of worsening shortness of breath and fatigue.  Endorses diffuse swelling that has been progressively worsening.  Denies any history of CHF.  Had recent echo that was reassuring.  Has been placed on Lasix but does not feel that it is a helping.  Denies any fevers or chills at home.  No recent antibiotics.  States that she was having some achy chest discomfort but after being placed on oxygen in triage feels like that pain is resolved.  Not noticed any significant or sudden weight loss.  Denies any abdominal pain.  No back pain.  No dysuria.   Echo:___________________________________________________  INTERPRETATION  NORMAL LEFT VENTRICULAR SYSTOLIC FUNCTION  NORMAL RIGHT VENTRICULAR SYSTOLIC FUNCTION  MILD VALVULAR REGURGITATION (See above)  NO VALVULAR STENOSIS     Past Medical History:  Diagnosis Date  . Cancer ([place])    skin  . Diabetes mellitus without complication ([place])   . Hypertension    Family History  Problem Relation Age of Onset  . Breast cancer Other   . Breast cancer Sister 43   Past Surgical History:  Procedure Laterality Date  . ABDOMINAL HYSTERECTOMY    . BREAST BIOPSY Left 2013   CORE W/CLIP - NEG   Patient Active Problem List   Diagnosis Date Noted  . Respiratory failure ([place]) 12/08/2019      Prior to Admission medications   Medication Sig Start Date End Date Taking? Authorizing Provider  aspirin EC 81 MG tablet Take 81 mg by mouth daily.   Yes [provider]  Calcium 600-400 MG-UNIT CHEW Chew 1 tablet by mouth 2 (two) times daily.   Yes [provider]  carvedilol (COREG) 6.25 MG tablet Take  6.25 mg by mouth 2 (two) times daily. 11/25/19  Yes [provider]  glimepiride (AMARYL) 2 MG tablet Take 2 mg by mouth daily with breakfast.   Yes [provider]  hydrochlorothiazide (HYDRODIURIL) 25 MG tablet Take 25 mg by mouth daily. 10/24/19  Yes [provider]  insulin detemir (LEVEMIR) 100 UNIT/ML injection Inject 32 Units into the skin 2 (two) times daily.   Yes [provider]  levothyroxine (SYNTHROID) 75 MCG tablet Take 75 mcg by mouth daily. 10/24/19  Yes [provider]  lisinopril (ZESTRIL) 40 MG tablet Take 40 mg by mouth daily. 10/24/19  Yes [provider]  lovastatin (MEVACOR) 20 MG tablet Take 20 mg by mouth at bedtime. 10/24/19  Yes [provider]  metFORMIN (GLUCOPHAGE) 1000 MG tablet Take 1,000 mg by mouth 2 (two) times daily. 10/24/19  Yes [provider]  naproxen sodium (ALEVE) 220 MG tablet Take 220 mg by mouth 2 (two) times daily as needed (pain).   Yes [provider]  omeprazole (PRILOSEC) 20 MG capsule Take 20 mg by mouth daily.   Yes [provider]  torsemide (DEMADEX) 20 MG tablet Take 20 mg by mouth daily. 12/04/19  Yes [provider]    Allergies Ampicillin, Contrast media [iodinated diagnostic agents], Penicillin g, Sulfamethoxazole-trimethoprim, Tetracyclines & related, Verapamil, and Amlodipine    Social History Social History   Tobacco Use  .  Smoking status: Never Smoker  . Smokeless tobacco: Never Used  Substance Use Topics  . Alcohol use: Not on file  . Drug use: Not on file    Review of Systems Patient denies headaches, rhinorrhea, blurry vision, numbness, shortness of breath, chest pain, edema, cough, abdominal pain, nausea, vomiting, diarrhea, dysuria, fevers, rashes or hallucinations unless otherwise stated above in HPI. ____________________________________________   PHYSICAL EXAM:  VITAL SIGNS: Vitals:   12/08/19 1226 12/08/19 1442  BP:  (!) 142/72 (!) 117/95  Pulse: 62 60  Resp: (!) 28 (!) 24  Temp:    SpO2: 98% 92%    Constitutional: Alert and oriented.  Eyes: Conjunctivae are normal.  Head: Atraumatic. Nose: No congestion/rhinnorhea. Mouth/Throat: Mucous membranes are moist.   Neck: No stridor. Painless ROM.  Cardiovascular: Normal rate, regular rhythm. Grossly normal heart sounds.  Good peripheral circulation. Respiratory: tachypnea, diminished posterior BS. Gastrointestinal: Soft and nontender. No distention. No abdominal bruits. No CVA tenderness. Genitourinary: deferred Musculoskeletal: No lower extremity tenderness. 2+ BLE edema.  No joint effusions. Neurologic:  Normal speech and language. No gross focal neurologic deficits are appreciated. No facial droop Skin:  Skin is warm, dry and intact. No rash noted. Psychiatric: Mood and affect are normal. Speech and behavior are normal.  ____________________________________________   LABS (all labs ordered are listed, but only abnormal results are displayed)  Results for orders placed or performed during the hospital encounter of 12/08/19 (from the past 24 hour(s))  Basic metabolic panel     Status: Abnormal   Collection Time: 12/08/19 10:36 AM  Result Value Ref Range   Sodium 133 (L) 135 - 145 mmol/L   Potassium 4.4 3.5 - 5.1 mmol/L   Chloride 83 (L) 98 - 111 mmol/L   CO2 40 (H) 22 - 32 mmol/L   Glucose, Bld 122 (H) 70 - 99 mg/dL   BUN 41 (H) 8 - 23 mg/dL   Creatinine, Ser 1.16 (H) 0.44 - 1.00 mg/dL   Calcium 9.2 8.9 - 10.3 mg/dL   GFR calc non Af Amer 45 (L) >60 mL/min   GFR calc Af Amer 52 (L) >60 mL/min   Anion gap 10 5 - 15  CBC     Status: Abnormal   Collection Time: 12/08/19 10:36 AM  Result Value Ref Range   WBC 10.8 (H) 4.0 - 10.5 K/uL   RBC 4.66 3.87 - 5.11 MIL/uL   Hemoglobin 12.0 12.0 - 15.0 g/dL   HCT 40.2 36 - 46 %   MCV 86.3 80.0 - 100.0 fL   MCH 25.8 (L) 26.0 - 34.0 pg   MCHC 29.9 (L) 30.0 - 36.0 g/dL   RDW 15.0 11.5 - 15.5 %    Platelets 431 (H) 150 - 400 K/uL   nRBC 0.0 0.0 - 0.2 %  Troponin I (High Sensitivity)     Status: Abnormal   Collection Time: 12/08/19 10:36 AM  Result Value Ref Range   Troponin I (High Sensitivity) 36 (H) <18 ng/L  Brain natriuretic peptide     Status: Abnormal   Collection Time: 12/08/19 10:36 AM  Result Value Ref Range   B Natriuretic Peptide 395.6 (H) 0.0 - 100.0 pg/mL  Hepatic function panel     Status: Abnormal   Collection Time: 12/08/19 10:36 AM  Result Value Ref Range   Total Protein 6.5 6.5 - 8.1 g/dL   Albumin 3.4 (L) 3.5 - 5.0 g/dL   AST 16 15 - 41 U/L   ALT 13 0 - 44  U/L   Alkaline Phosphatase 50 38 - 126 U/L   Total Bilirubin 0.6 0.3 - 1.2 mg/dL   Bilirubin, Direct <0.1 0.0 - 0.2 mg/dL   Indirect Bilirubin NOT CALCULATED 0.3 - 0.9 mg/dL  Procalcitonin     Status: None   Collection Time: 12/08/19 10:36 AM  Result Value Ref Range   Procalcitonin <0.10 ng/mL  Blood gas, venous     Status: Abnormal (Preliminary result)   Collection Time: 12/08/19 11:25 AM  Result Value Ref Range   pH, Ven 7.31 7.25 - 7.43   pCO2, Ven 103 (HH) 44 - 60 mmHg   pO2, Ven PENDING 32 - 45 mmHg   Bicarbonate 51.9 (H) 20.0 - 28.0 mmol/L   Acid-Base Excess 20.5 (H) 0.0 - 2.0 mmol/L   O2 Saturation 50.7 %   Patient temperature 37.0    Collection site VEIN    Sample type VEIN   SARS Coronavirus 2 by RT PCR (hospital order, performed in [place] hospital lab) Nasopharyngeal Nasopharyngeal Swab     Status: None   Collection Time: 12/08/19 11:25 AM   Specimen: Nasopharyngeal Swab  Result Value Ref Range   SARS Coronavirus 2 NEGATIVE NEGATIVE  Lactic acid, plasma     Status: None   Collection Time: 12/08/19 11:25 AM  Result Value Ref Range   Lactic Acid, Venous 1.9 0.5 - 1.9 mmol/L  Troponin I (High Sensitivity)     Status: Abnormal   Collection Time: 12/08/19 12:30 PM  Result Value Ref Range   Troponin I (High Sensitivity) 39 (H) <18 ng/L    ____________________________________________  EKG My review and personal interpretation at Time: 10:36   Indication: hypoxia  Rate: 70  Rhythm: sinus Axis: normal Other: normal intervals, no stemi ____________________________________________  RADIOLOGY  I personally reviewed all radiographic images ordered to evaluate for the above acute complaints and reviewed radiology reports and findings.  These findings were personally discussed with the patient.  Please see medical record for radiology report.  ____________________________________________   PROCEDURES  Procedure(s) performed:  .Critical Care Performed by: Merlyn Lot, MD Authorized by: Merlyn Lot, MD   Critical care provider statement:    Critical care time (minutes):  35   Critical care time was exclusive of:  Separately billable procedures and treating other patients   Critical care was necessary to treat or prevent imminent or life-threatening deterioration of the following conditions:  Respiratory failure   Critical care was time spent personally by me on the following activities:  Development of treatment plan with patient or surrogate, discussions with consultants, evaluation of patient's response to treatment, examination of patient, obtaining history from patient or surrogate, ordering and performing treatments and interventions, ordering and review of laboratory studies, ordering and review of radiographic studies, pulse oximetry, re-evaluation of patient's condition and review of old charts      Critical Care performed: yes ____________________________________________   INITIAL IMPRESSION / [place] / ED COURSE  Pertinent labs & imaging results that were available during my care of the patient were reviewed by me and considered in my medical decision making (see chart for details).   DDX: Asthma, copd, CHF, pna, ptx, malignancy, Pe, anemia   Trinaty B Ticas is a 79 y.o. who  presents to the ED with shortness of breath as described above.  Patient with proximal respiratory failure as described above requiring supplemental oxygen.  Does have diffuse edema and almost anasarca appearing but with recent echo that was reportedly normal.  Chest  x-ray bilateral effusions.  CT imaging without be ordered to evaluate for any evidence of malignancy which is on the differential.  No significant contrast allergy.  Have a lower suspicion for PE at this time.  Will check VBG she is also somewhat confused but is protecting her airway.  Clinical Course as of Dec 07 1536  Mon Dec 08, 2019  1120 Patient's troponin is mildly elevated.  Is likely secondary to demand ischemia in the setting of hypoxic respiratory failure.  She does have a contrast allergy I do want it CT scan to evaluate for these effusions a query where she has some malignancy contributing to her presentation given her age.  Seems less likely CHF given normal ultrasound and echo this past week.   [PR]  1340 Patient was significantly elevated PCO2.  Placed on BiPAP.  Will give steroid as well as additional nebs.   [PR]    Clinical Course User Index [PR] Merlyn Lot, MD    The patient was evaluated in Emergency Department today for the symptoms described in the history of present illness. He/she was evaluated in the context of the global COVID-19 pandemic, which necessitated consideration that the patient might be at risk for infection with the SARS-CoV-2 virus that causes COVID-19. Institutional protocols and algorithms that pertain to the evaluation of patients at risk for COVID-19 are in a state of rapid change based on information released by regulatory bodies including the CDC and federal and state organizations. These policies and algorithms were followed during the patient's care in the ED.  As part of my medical decision making, I reviewed the following data within the [place] notes  reviewed and incorporated, Labs reviewed, notes from prior ED visits and [place] Controlled Substance Database   ____________________________________________   FINAL CLINICAL IMPRESSION(S) / ED DIAGNOSES  Final diagnoses:  Acute respiratory failure with hypoxia and hypercapnia (HCC)      NEW MEDICATIONS STARTED DURING THIS VISIT:  New Prescriptions   No medications on file     Note:  This document was prepared using Dragon voice recognition software and may include unintentional dictation errors.    Merlyn Lot, MD 12/08/19 1538

## 2019-12-09 ENCOUNTER — Inpatient Hospital Stay: Payer: Medicare HMO

## 2019-12-09 DIAGNOSIS — I5023 Acute on chronic systolic (congestive) heart failure: Secondary | ICD-10-CM

## 2019-12-09 DIAGNOSIS — I248 Other forms of acute ischemic heart disease: Secondary | ICD-10-CM

## 2019-12-09 LAB — BASIC METABOLIC PANEL
Anion gap: 11 (ref 5–15)
BUN: 36 mg/dL — ABNORMAL HIGH (ref 8–23)
CO2: 37 mmol/L — ABNORMAL HIGH (ref 22–32)
Calcium: 8.9 mg/dL (ref 8.9–10.3)
Chloride: 85 mmol/L — ABNORMAL LOW (ref 98–111)
Creatinine, Ser: 1.03 mg/dL — ABNORMAL HIGH (ref 0.44–1.00)
GFR calc Af Amer: 60 mL/min — ABNORMAL LOW (ref >60)
GFR calc non Af Amer: 52 mL/min — ABNORMAL LOW (ref >60)
Glucose, Bld: 152 mg/dL — ABNORMAL HIGH (ref 70–99)
Potassium: 4.8 mmol/L (ref 3.5–5.1)
Sodium: 133 mmol/L — ABNORMAL LOW (ref 135–145)

## 2019-12-09 LAB — CBC
HCT: 43.2 % (ref 36.0–46.0)
Hemoglobin: 12.7 g/dL (ref 12.0–15.0)
MCH: 26 pg (ref 26.0–34.0)
MCHC: 29.4 g/dL — ABNORMAL LOW (ref 30.0–36.0)
MCV: 88.5 fL (ref 80.0–100.0)
Platelets: 418 10*3/uL — ABNORMAL HIGH (ref 150–400)
RBC: 4.88 MIL/uL (ref 3.87–5.11)
RDW: 15 % (ref 11.5–15.5)
WBC: 8.6 10*3/uL (ref 4.0–10.5)
nRBC: 0 % (ref 0.0–0.2)

## 2019-12-09 LAB — ECHOCARDIOGRAM COMPLETE
Height: 60 in
Weight: 3536 oz

## 2019-12-09 LAB — GLUCOSE, CAPILLARY
Glucose-Capillary: 39 mg/dL — CL (ref 70–99)
Glucose-Capillary: 148 mg/dL — ABNORMAL HIGH (ref 70–99)
Glucose-Capillary: 121 mg/dL — ABNORMAL HIGH (ref 70–99)
Glucose-Capillary: 130 mg/dL — ABNORMAL HIGH (ref 70–99)
Glucose-Capillary: 149 mg/dL — ABNORMAL HIGH (ref 70–99)
Glucose-Capillary: 168 mg/dL — ABNORMAL HIGH (ref 70–99)

## 2019-12-09 LAB — URINE CULTURE: Culture: NO GROWTH

## 2019-12-09 LAB — BLOOD GAS, ARTERIAL
Acid-Base Excess: 19.5 mmol/L — ABNORMAL HIGH (ref 0.0–2.0)
Bicarbonate: 49.1 mmol/L — ABNORMAL HIGH (ref 20.0–28.0)
FIO2: 0.36
O2 Saturation: 93 %
Patient temperature: 37
pCO2 arterial: 85 mmHg (ref 32.0–48.0)
pH, Arterial: 7.37 (ref 7.350–7.450)
pO2, Arterial: 69 mmHg — ABNORMAL LOW (ref 83.0–108.0)

## 2019-12-09 MED ORDER — INSULIN ASPART 100 UNIT/ML ~~LOC~~ SOLN
0.0000 [IU] | SUBCUTANEOUS | Status: DC
  Administered 2019-12-09 (×2): 2 [IU] via SUBCUTANEOUS
  Administered 2019-12-09: 4 [IU] via SUBCUTANEOUS
  Administered 2019-12-10 (×2): 2 [IU] via SUBCUTANEOUS
  Administered 2019-12-10: 20:00:00 4 [IU] via SUBCUTANEOUS
  Administered 2019-12-10 – 2019-12-11 (×3): 2 [IU] via SUBCUTANEOUS
  Administered 2019-12-11 (×2): 4 [IU] via SUBCUTANEOUS
  Administered 2019-12-11 – 2019-12-12 (×4): 2 [IU] via SUBCUTANEOUS
  Administered 2019-12-12: 8 [IU] via SUBCUTANEOUS
  Administered 2019-12-13: 09:00:00 2 [IU] via SUBCUTANEOUS
  Administered 2019-12-13: 8 [IU] via SUBCUTANEOUS
  Administered 2019-12-13: 22:00:00 4 [IU] via SUBCUTANEOUS
  Administered 2019-12-13 – 2019-12-14 (×4): 2 [IU] via SUBCUTANEOUS
  Administered 2019-12-14: 8 [IU] via SUBCUTANEOUS
  Administered 2019-12-14: 04:00:00 2 [IU] via SUBCUTANEOUS
  Administered 2019-12-14: 8 [IU] via SUBCUTANEOUS
  Administered 2019-12-14 (×2): 2 [IU] via SUBCUTANEOUS
  Administered 2019-12-15: 20:00:00 4 [IU] via SUBCUTANEOUS
  Administered 2019-12-15 (×3): 2 [IU] via SUBCUTANEOUS
  Administered 2019-12-15 – 2019-12-16 (×2): 4 [IU] via SUBCUTANEOUS
  Administered 2019-12-16: 2 [IU] via SUBCUTANEOUS
  Administered 2019-12-16: 8 [IU] via SUBCUTANEOUS
  Filled 2019-12-09 (×36): qty 1

## 2019-12-09 MED ORDER — NOREPINEPHRINE 4 MG/250ML-% IV SOLN
INTRAVENOUS | Status: AC
  Filled 2019-12-09: qty 250

## 2019-12-09 MED ORDER — CHLORHEXIDINE GLUCONATE CLOTH 2 % EX PADS
6.0000 | MEDICATED_PAD | Freq: Every day | CUTANEOUS | Status: DC
  Administered 2019-12-10 – 2019-12-12 (×3): 6 via TOPICAL

## 2019-12-09 MED ORDER — POTASSIUM CHLORIDE CRYS ER 20 MEQ PO TBCR
20.0000 meq | EXTENDED_RELEASE_TABLET | Freq: Every day | ORAL | Status: DC
  Administered 2019-12-10 – 2019-12-12 (×3): 20 meq via ORAL
  Filled 2019-12-09 (×3): qty 1

## 2019-12-09 MED ORDER — NOREPINEPHRINE 4 MG/250ML-% IV SOLN
2.0000 ug/min | INTRAVENOUS | Status: DC
  Administered 2019-12-09: 2 ug/min via INTRAVENOUS

## 2019-12-09 MED ORDER — DEXTROSE 50 % IV SOLN: 1.0000 | Freq: Every day | INTRAVENOUS | Status: DC | PRN

## 2019-12-09 MED ORDER — FUROSEMIDE 10 MG/ML IJ SOLN
80.0000 mg | Freq: Every day | INTRAMUSCULAR | Status: DC
  Administered 2019-12-09 – 2019-12-10 (×2): 80 mg via INTRAVENOUS
  Filled 2019-12-09 (×2): qty 8

## 2019-12-09 MED ORDER — TORSEMIDE 20 MG PO TABS
20.0000 mg | ORAL_TABLET | Freq: Every day | ORAL | Status: DC
  Administered 2019-12-09: 20 mg via ORAL
  Filled 2019-12-09: qty 1

## 2019-12-09 NOTE — Progress Notes (Signed)
SYNOPSIS Admitted for RESP DISTRESS, EFFUSIONS GENERALIZED ANASARCA  CC  resp failure resolving  HPI Alert and awake Following commands       VITALS: BP (!) 110/44   Pulse (!) 56   Temp 97.6 F (36.4 C) (Axillary)   Resp 15   Ht 5' (1.524 m)   Wt 100.2 kg   SpO2 95%   BMI 43.16 kg/m     I/O last 3 completed shifts: In: 163.5 [I.V.:33.3; IV Piggyback:130.2] Out: 0  No intake/output data recorded.  SpO2: 95 % O2 Flow Rate (L/min): 2 L/min FiO2 (%): 32 %  Estimated body mass index is 43.16 kg/m as calculated from the following:   Height as of this encounter: 5' (1.524 m).   Weight as of this encounter: 100.2 kg.   Review of Systems:  Gen:  Denies  fever, sweats, chills weigh loss  HEENT: Denies blurred vision, double vision, ear pain, eye pain, hearing loss, nose bleeds, sore throat Cardiac:  No dizziness, chest pain or heaviness, chest tightness,edema, No JVD Resp:   No cough, -sputum production, -shortness of breath,-wheezing, -hemoptysis,  Gi: Denies swallowing difficulty, stomach pain, nausea or vomiting, diarrhea, constipation, bowel incontinence Other:  All other systems negative   Physical Examination:   General Appearance: No distress  Neuro:without focal findings,  speech normal,  HEENT: PERRLA, EOM intact.   Pulmonary: normal breath sounds, No wheezing.  CardiovascularNormal S1,S2.  No m/r/g.   Abdomen: Benign, Soft, non-tender. Renal:  No costovertebral tenderness  GU:  Not performed at this time. Endoc: No evident thyromegaly Skin:   warm, no rashes, no ecchymosis  Extremities: +edema PSYCHIATRIC: Mood, affect within normal limits.     I personally reviewed Labs under Results section.   MEDICATIONS: I have reviewed all medications and confirmed regimen as documented   CULTURE RESULTS   Recent Results (from the past 240 hour(s))  SARS Coronavirus 2 by RT PCR (hospital order, performed in [facility] hospital lab) Nasopharyngeal  Nasopharyngeal Swab     Status: None   Collection Time: 12/08/19 11:25 AM   Specimen: Nasopharyngeal Swab  Result Value Ref Range Status   SARS Coronavirus 2 NEGATIVE NEGATIVE Final    Comment: (NOTE) SARS-CoV-2 target nucleic acids are NOT DETECTED.  The SARS-CoV-2 RNA is generally detectable in upper and lower respiratory specimens during the acute phase of infection. The lowest concentration of SARS-CoV-2 viral copies this assay can detect is 250 copies / mL. A negative result does not preclude SARS-CoV-2 infection and should not be used as the sole basis for treatment or other patient management decisions.  A negative result may occur with improper specimen collection / handling, submission of specimen other than nasopharyngeal swab, presence of viral mutation(s) within the areas targeted by this assay, and inadequate number of viral copies (<250 copies / mL). A negative result must be combined with clinical observations, patient history, and epidemiological information.  Fact Sheet for Patients:   StrictlyIdeas.no  Fact Sheet for Healthcare Providers: BankingDealers.co.za  This test is not yet approved or  cleared by the [place] FDA and has been authorized for detection and/or diagnosis of SARS-CoV-2 by FDA under an Emergency Use Authorization (EUA).  This EUA will remain in effect (meaning this test can be used) for the duration of the COVID-19 declaration under Section 564(b)(1) of the Act, 21 U.S.C. section 360bbb-3(b)(1), unless the authorization is terminated or revoked sooner.  Performed at [facility], [address], [place], [place] [address]  MRSA PCR Screening     Status: None   Collection Time: 12/08/19  4:37 PM   Specimen: Nasopharyngeal  Result Value Ref Range Status   MRSA by PCR NEGATIVE NEGATIVE Final    Comment:        The GeneXpert MRSA Assay (FDA approved for NASAL specimens only), is  one component of a comprehensive MRSA colonization surveillance program. It is not intended to diagnose MRSA infection nor to guide or monitor treatment for MRSA infections. Performed at [facility], [address] [place], [place] [address]           IMAGING    DG Chest 2 View  Result Date: 12/08/2019 CLINICAL DATA:  Shortness of breath EXAM: CHEST - 2 VIEW COMPARISON:  December 02, 2019 FINDINGS: There is persistent airspace opacity with pleural effusion at the left base. There is a smaller right pleural effusion with ill-defined airspace opacity in the right base. Heart size and pulmonary vascularity are normal. No adenopathy. No bone lesions. IMPRESSION: Pleural effusions bilaterally, larger on the left than on the right ill-defined airspace opacity in the lung bases, more on the left than on the right. Changes on the right are new compared to most recent study. Changes on the left are similar. Stable cardiac silhouette. Electronically Signed   By: Lowella Grip III M.D.   On: 12/08/2019 11:01   CT Chest Wo Contrast  Result Date: 12/08/2019 CLINICAL DATA:  Follow-up abnormal chest x-ray EXAM: CT CHEST WITHOUT CONTRAST TECHNIQUE: Multidetector CT imaging of the chest was performed following the standard protocol without IV contrast. COMPARISON:  Chest x-ray from earlier in the same day. FINDINGS: Cardiovascular: Somewhat limited due to lack of IV contrast. Aortic calcifications are noted without aneurysmal dilatation. Heart is at the upper limits of normal in size. Coronary calcifications are seen. Mediastinum/Nodes: Thoracic inlet is within normal limits. No hilar or mediastinal adenopathy is noted. Noted the esophagus as visualized is within normal limits. Lungs/Pleura: Bilateral pleural effusions are noted left slightly greater than right with some associated lower lobe atelectatic changes identified again left greater than right. Upper Abdomen: Visualized upper abdomen shows  no acute abnormality. Musculoskeletal: Degenerative changes of the thoracic spine are noted. Chronic compression deformity at T9. IMPRESSION: Bilateral pleural effusions left greater than right with associated atelectatic changes. T9 compression deformity which appears chronic with increased sclerosis. No other focal abnormality is noted. Aortic Atherosclerosis (ICD10-I70.0). Electronically Signed   By: Inez Catalina M.D.   On: 12/08/2019 13:10     CBC    Component Value Date/Time   WBC 8.6 12/09/2019 0400   RBC 4.88 12/09/2019 0400   HGB 12.7 12/09/2019 0400   HCT 43.2 12/09/2019 0400   PLT 418 (H) 12/09/2019 0400   MCV 88.5 12/09/2019 0400   MCH 26.0 12/09/2019 0400   MCHC 29.4 (L) 12/09/2019 0400   RDW 15.0 12/09/2019 0400   BMP Latest Ref Rng & Units 12/09/2019 12/08/2019  Glucose 70 - 99 mg/dL 152(H) 122(H)  BUN 8 - 23 mg/dL 36(H) 41(H)  Creatinine 0.44 - 1.00 mg/dL 1.03(H) 1.16(H)  Sodium 135 - 145 mmol/L 133(L) 133(L)  Potassium 3.5 - 5.1 mmol/L 4.8 4.4  Chloride 98 - 111 mmol/L 85(L) 83(L)  CO2 22 - 32 mmol/L 37(H) 40(H)  Calcium 8.9 - 10.3 mg/dL 8.9 9.2       ASSESSMENT AND PLAN SYNOPSIS  79 yo WF with morbid obesity admitted for acute resp distress with acute hypercapnic resp failure with acute  b/l effusions and pulm edema with generalized anasarca, at this time, I am concerned about possible acute systolic/diastolic heart failure, with probable liver failure and renal failure associated with severe hypoglycemia  resp failure- Resolved Consider lasix therapy  ECHO PENDING CT ABD PENDING  ELECTROLYTES -follow labs as needed -replace as needed -pharmacy consultation and following   DVT/GI PRX ordered and assessed TRANSFUSIONS AS NEEDED MONITOR FSBS I Assessed the need for Labs I Assessed the need for Foley I Assessed the need for Central Venous Line Family Discussion when available I Assessed the need for Mobilization I made an Assessment of medications  to be adjusted accordingly Safety Risk assessment completed  CASE DISCUSSED IN MULTIDISCIPLINARY ROUNDS WITH ICU TEAM    Heywood Tokunaga Patricia Pesa, M.D.  [facility]  Medical Director [place] Director [place] Department

## 2019-12-09 NOTE — Consult Note (Signed)
Cardiology Consultation:   Patient ID: Tammie [name]; [id number]; [birth year]   Admit date: 12/08/2019 Date of Consult: 12/09/2019  Primary Care Provider: Dion Body, MD Primary Cardiologist: New to [facility] - consult by End Primary Electrophysiologist:  None   Patient Profile:   Tammie [name] is a 79 y.o. female with a hx of insulin-dependent diabetes mellitus, hypertension, hyperlipidemia, cervical/thoracic/lumbar osteoarthritis, and obesity who is being seen today for the evaluation of acute on chronic HFpEF at the request of Dr. Mortimer Fries.  History of Present Illness:   Tammie [name] has noted an approximate 6-week history of progressive dyspnea with associated fatigue/malaise and lower extremity edema.  She indicated her weight had increased from a previous dry weight of approximately 206-208 pounds to a weight of 226 pounds over a 20-month timeframe.  She indicates that she does apply salt to some foods though does not eat out at restaurants very often.  She is uncertain if she has sleep apnea with no prior sleep study.  She denies any chest pain, palpitations, dizziness, presyncope, syncope.  She has a stable 1-2 pillow orthopnea.  She was evaluated at her PCPs office earlier this month with chest x-ray on 6/22 showing a small to moderate left pleural effusion.  Echo on 6/21, showed an EF greater than 81%, grade 2 diastolic dysfunction, no regional wall motion abnormalities, normal RV systolic function, trivial aortic insufficiency , mitral regurgitation/tricuspid regurgitation/pulmonic regurgitation.  VQ scan on 6/22 low probability for PE.  Lower extremity ultrasound negative for DVT bilaterally.  BNP mildly elevated at 139.  She was started on Lasix 20 mg daily at that time.  At follow-up visit with PCP on 6/28 she was documented to be hypoxic at 71% on room air with a documented weight of 221 pounds.  In the setting she was transferred to the [facility] ED for further  evaluation.  BP has ranged from 15-7 50 systolic with heart rate 55 to 68 bpm.  Documented weight 221 pounds.  Labs showed a BNP of 395, initial high-sensitivity troponin 36 with a delta of 39.  Covid negative.  BUN/SCr 41/1.16 trending to 36/1.03.  Albumin 3.4.  AST/ALT normal.  WBC 10.8 trending to 8.6 with Hgb 12.0.  Repeat chest x-ray showed pleural effusions bilaterally with the left being greater than the right with right-sided changes being new compared to prior study.  CT chest showed bilateral pleural effusions with the left being greater than the right with associated atelectatic change as well as likely chronic T9 compression deformity and aortic atherosclerosis.  CT abdomen and pelvis demonstrated no acute finding within the abdomen or pelvis with bilateral kidney cysts that were incompletely characterized, gallstones, small to moderate bilateral pleural effusions with left lower lobe atelectasis/consolidation, anasarca, and aortic atherosclerosis.  Echo on 6/28 showed an EF of 60 to 65%, no regional wall motion masses, mild LVH, grade 2 diastolic dysfunction, mild to moderately elevated RVSP at 35 mmHg plus CVP, normal RV systolic function with mildly enlarged RV cavity size, degenerative mitral valve with trace regurgitation, mild to moderate tricuspid regurgitation.  She previously required vasopressor support with Levophed though this has subsequently been tapered off.  Briefly treated with BiPAP, now she remains on supplemental oxygen via nasal cannula.  At baseline she is not on supplemental oxygen.  She has been continued on PTA torsemide 20 mg daily with minimal documented urine output.  In seeing the patient this afternoon she does feel like her dyspnea is a little bit  improved though not at baseline.    Past Medical History:  Diagnosis Date  . Cancer ([place])    skin  . Diabetes mellitus without complication ([place])   . Hypertension     Past Surgical History:  Procedure Laterality  Date  . ABDOMINAL HYSTERECTOMY    . BREAST BIOPSY Left 2013   CORE W/CLIP - NEG     Home Meds: Prior to Admission medications   Medication Sig Start Date End Date Taking? Authorizing Provider  aspirin EC 81 MG tablet Take 81 mg by mouth daily.   Yes [provider]  Calcium 600-400 MG-UNIT CHEW Chew 1 tablet by mouth 2 (two) times daily.   Yes [provider]  carvedilol (COREG) 6.25 MG tablet Take 6.25 mg by mouth 2 (two) times daily. 11/25/19  Yes [provider]  glimepiride (AMARYL) 2 MG tablet Take 2 mg by mouth daily with breakfast.   Yes [provider]  hydrochlorothiazide (HYDRODIURIL) 25 MG tablet Take 25 mg by mouth daily. 10/24/19  Yes [provider]  insulin detemir (LEVEMIR) 100 UNIT/ML injection Inject 32 Units into the skin 2 (two) times daily.   Yes [provider]  levothyroxine (SYNTHROID) 75 MCG tablet Take 75 mcg by mouth daily. 10/24/19  Yes [provider]  lisinopril (ZESTRIL) 40 MG tablet Take 40 mg by mouth daily. 10/24/19  Yes [provider]  lovastatin (MEVACOR) 20 MG tablet Take 20 mg by mouth at bedtime. 10/24/19  Yes [provider]  metFORMIN (GLUCOPHAGE) 1000 MG tablet Take 1,000 mg by mouth 2 (two) times daily. 10/24/19  Yes [provider]  naproxen sodium (ALEVE) 220 MG tablet Take 220 mg by mouth 2 (two) times daily as needed (pain).   Yes [provider]  omeprazole (PRILOSEC) 20 MG capsule Take 20 mg by mouth daily.   Yes [provider]  torsemide (DEMADEX) 20 MG tablet Take 20 mg by mouth daily. 12/04/19  Yes [provider]    Inpatient Medications: Scheduled Meds: . Chlorhexidine Gluconate Cloth  6 each Topical Daily  . enoxaparin (LOVENOX) injection  40 mg Subcutaneous BID  . insulin aspart  0-24 Units Subcutaneous Q4H  . ipratropium-albuterol  3 mL Nebulization Once  . sodium chloride flush  3 mL Intravenous Q12H  . torsemide  20  mg Oral Daily   Continuous Infusions: . sodium chloride 250 mL (12/09/19 1046)  . famotidine (PEPCID) IV 20 mg (12/09/19 1048)  . norepinephrine Stopped (12/09/19 0930)  . norepinephrine (LEVOPHED) Adult infusion Stopped (12/09/19 0930)   PRN Meds: sodium chloride, acetaminophen, dextrose, docusate sodium, ondansetron (ZOFRAN) IV, polyethylene glycol, sodium chloride flush  Allergies:   Allergies  Allergen Reactions  . Ampicillin Hives  . Contrast Media [Iodinated Diagnostic Agents] Hives  . Penicillin G Hives  . Sulfamethoxazole-Trimethoprim Hives  . Tetracyclines & Related Hives  . Verapamil Hives  . Amlodipine Rash    Social History:   Social History   Socioeconomic History  . Marital status: Married    Spouse name: Not on file  . Number of children: Not on file  . Years of education: Not on file  . Highest education level: Not on file  Occupational History  . Not on file  Tobacco Use  . Smoking status: Never Smoker  . Smokeless tobacco: Never Used  Substance and Sexual Activity  . Alcohol use: Not on file  . Drug use: Not on file  . Sexual activity: Not on file  Other  Topics Concern  . Not on file  Social History Narrative  . Not on file   Social Determinants of Health   Financial Resource Strain:   . Difficulty of Paying Living Expenses:   Food Insecurity:   . Worried About Charity fundraiser in the Last Year:   . Arboriculturist in the Last Year:   Transportation Needs:   . Film/video editor (Medical):   [name] Lack of Transportation (Non-Medical):   Physical Activity:   . Days of Exercise per Week:   . Minutes of Exercise per Session:   Stress:   . Feeling of Stress :   Social Connections:   . Frequency of Communication with Friends and Family:   . Frequency of Social Gatherings with Friends and Family:   . Attends Religious Services:   . Active Member of Clubs or Organizations:   . Attends Archivist Meetings:   [name] Marital Status:     Intimate Partner Violence:   . Fear of Current or Ex-Partner:   . Emotionally Abused:   [name] Physically Abused:   . Sexually Abused:      Family History:   Family History  Problem Relation Age of Onset  . Breast cancer Other   . Breast cancer Sister 50    ROS:  Review of Systems  Constitutional: Positive for malaise/fatigue. Negative for chills, diaphoresis, fever and weight loss.  HENT: Negative for congestion.   Eyes: Negative for discharge and redness.  Respiratory: Positive for shortness of breath. Negative for cough, sputum production and wheezing.   Cardiovascular: Positive for orthopnea and leg swelling. Negative for chest pain, palpitations, claudication and PND.  Gastrointestinal: Negative for abdominal pain, heartburn, nausea and vomiting.  Musculoskeletal: Negative for falls and myalgias.  Skin: Negative for rash.  Neurological: Positive for weakness. Negative for dizziness, tingling, tremors, sensory change, speech change, focal weakness and loss of consciousness.  Endo/Heme/Allergies: Does not bruise/bleed easily.  Psychiatric/Behavioral: Negative for substance abuse. The patient is not nervous/anxious.   All other systems reviewed and are negative.     Physical Exam/Data:   Vitals:   12/09/19 0600 12/09/19 0615 12/09/19 0630 12/09/19 0750  BP: (!) 103/40 (!) 104/44 (!) 110/44   Pulse: (!) 56 (!) 58 (!) 56   Resp: 17 15 15    Temp:    97.6 F (36.4 C)  TempSrc:    Axillary  SpO2: 94% 94% 95%   Weight:      Height:        Intake/Output Summary (Last 24 hours) at 12/09/2019 1310 Last data filed at 12/09/2019 0600 Gross per 24 hour  Intake 163.48 ml  Output 0 ml  Net 163.48 ml   Filed Weights   12/08/19 1013  Weight: 100.2 kg   Body mass index is 43.16 kg/m.   Physical Exam: General: Well developed, well nourished, in no acute distress. Head: Normocephalic, atraumatic, sclera non-icteric, no xanthomas, nares without discharge.  Neck: Negative for  carotid bruits. JVD difficult to assess secondary to cervical spine arthritis with limited mobility. Lungs: Diminished breath sounds along the bilateral bases. Breathing is unlabored. Heart: RRR with S1 S2. No murmurs, rubs, or gallops appreciated. Abdomen: Soft, non-tender, non-distended with normoactive bowel sounds. No hepatomegaly. No rebound/guarding. No obvious abdominal masses. Msk:  Strength and tone appear normal for age. Extremities: No clubbing or cyanosis.  1+ bilateral lower extremity pitting edema. Distal pedal pulses are 2+ and equal bilaterally. Neuro: Alert and oriented X 3.  No facial asymmetry. No focal deficit. Moves all extremities spontaneously. Psych:  Responds to questions appropriately with a normal affect.   EKG:  The EKG was personally reviewed and demonstrates: NSR, 68 bpm, right axis deviation, no acute ST-T changes Telemetry:  Telemetry was personally reviewed and demonstrates: SR with occasional PACs  Weights: Filed Weights   12/08/19 1013  Weight: 100.2 kg    Relevant CV Studies:  2D echo 12/01/2019 performed at [facility]: INTERPRETATION  NORMAL LEFT VENTRICULAR SYSTOLIC FUNCTION  NORMAL RIGHT VENTRICULAR SYSTOLIC FUNCTION  MILD VALVULAR REGURGITATION (See above)  NO VALVULAR STENOSIS  ECHOCARDIOGRAPHIC DESCRIPTIONS  AORTIC ROOT          Size: Normal       Dissection: INDETERM FOR DISSECTION  AORTIC VALVE        Leaflets: Tricuspid          Morphology: Normal        Mobility: Fully mobile  LEFT VENTRICLE          Size: Normal            Anterior: Normal      Contraction: Normal             Lateral: Normal       Closest EF: >55% (Estimated)        Septal: Normal       LV Masses: No Masses            Apical: Normal          LVH: None             Inferior: Normal                            Posterior: Normal      Dias.FxClass: (Grade 2) relaxation abnormal, pseudonormal  MITRAL VALVE        Leaflets: Normal            Mobility: Fully mobile       Morphology: Normal  LEFT ATRIUM          Size: MILDLY ENLARGED       LA Masses: No masses       IA Septum: Normal IAS  MAIN PA          Size: Normal  PULMONIC VALVE       Morphology: Normal            Mobility: Fully mobile  RIGHT VENTRICLE       RV Masses: No Masses             Size: Normal       Free Wall: Normal          Contraction: Normal  TRICUSPID VALVE        Leaflets: Normal            Mobility: Fully mobile       Morphology: Normal  RIGHT ATRIUM          Size: MILDLY ENLARGED        RA Other: None        RA Mass: No masses  PERICARDIUM         Fluid: No effusion  INFERIOR VENACAVA          Size: Normal Normal respiratory collapse    __________  2D echo 12/08/2019: 1. Left ventricular ejection fraction, by estimation, is 60 to 65%. The  left ventricle has normal function. The left  ventricle has no regional  wall motion abnormalities. There is mild left ventricular hypertrophy.  Left ventricular diastolic parameters  are consistent with Grade II diastolic dysfunction (pseudonormalization).  Elevated left atrial pressure.  2. Pulmonary artery pressure is mildly to moderately elevated (RVSP 35  mmHg plus central venous pressure). Right ventricular systolic function is  normal. The right ventricular size is mildly enlarged. Mildly increased  right ventricular wall thickness.  3. The mitral valve is degenerative. Trivial mitral valve regurgitation.  No evidence of mitral stenosis.  4. Tricuspid valve regurgitation is mild to moderate.  5. The aortic valve was not well visualized. Aortic valve regurgitation  is not visualized. No  aortic stenosis is present.  Laboratory Data:  Chemistry Recent Labs  Lab 12/08/19 1036 12/09/19 0400  NA 133* 133*  K 4.4 4.8  CL 83* 85*  CO2 40* 37*  GLUCOSE 122* 152*  BUN 41* 36*  CREATININE 1.16* 1.03*  CALCIUM 9.2 8.9  GFRNONAA 45* 52*  GFRAA 52* 60*  ANIONGAP 10 11    Recent Labs  Lab 12/08/19 1036  PROT 6.5  ALBUMIN 3.4*  AST 16  ALT 13  ALKPHOS 50  BILITOT 0.6   Hematology Recent Labs  Lab 12/08/19 1036 12/09/19 0400  WBC 10.8* 8.6  RBC 4.66 4.88  HGB 12.0 12.7  HCT 40.2 43.2  MCV 86.3 88.5  MCH 25.8* 26.0  MCHC 29.9* 29.4*  RDW 15.0 15.0  PLT 431* 418*   Cardiac EnzymesNo results for input(s): TROPONINI in the last 168 hours. No results for input(s): TROPIPOC in the last 168 hours.  BNP Recent Labs  Lab 12/08/19 1036  BNP 395.6*    DDimer No results for input(s): DDIMER in the last 168 hours.  Radiology/Studies:  CT ABDOMEN PELVIS WO CONTRAST  Result Date: 12/09/2019 IMPRESSION: 1. No acute findings identified within the abdomen or pelvis. 2. Bilateral kidney cysts. Incompletely characterized without IV contrast. 3. Gallstones. 4. Small to moderate bilateral pleural effusions with left lower lobe atelectasis/consolidation. 5. Anasarca. 6. Aortic atherosclerosis. Aortic Atherosclerosis (ICD10-I70.0). Electronically Signed   By: Kerby Moors M.D.   On: 12/09/2019 09:31   DG Chest 2 View  Result Date: 12/08/2019 IMPRESSION: Pleural effusions bilaterally, larger on the left than on the right ill-defined airspace opacity in the lung bases, more on the left than on the right. Changes on the right are new compared to most recent study. Changes on the left are similar. Stable cardiac silhouette. Electronically Signed   By: Lowella Grip III M.D.   On: 12/08/2019 11:01   CT Chest Wo Contrast  Result Date: 12/08/2019 IMPRESSION: Bilateral pleural effusions left greater than right with associated atelectatic changes. T9 compression deformity  which appears chronic with increased sclerosis. No other focal abnormality is noted. Aortic Atherosclerosis (ICD10-I70.0). Electronically Signed   By: Inez Catalina M.D.   On: 12/08/2019 13:10     Assessment and Plan:   1.  Acute on chronic HFpEF/pulmonary hypertension: -She remains volume overloaded the patient reported dry weight of approximately 206-208 pounds with a current weight of 226 pounds -Likely secondary to obesity with likely OSA and OHS -Discontinue PTA torsemide -Start IV Lasix 80 mg daily with KCl repletion -If patient becomes hypotensive with this, may need low-dose Levophed -Patient will need outpatient sleep study -CHF education -Daily weights -Strict ins and outs  2.  Elevated troponin: -Never with symptoms of chest pain -Minimally elevated likely secondary to supply demand ischemia in the setting of volume overload  with mild AKI -No indication for heparin drip, not consistent with ACS -ASA -Consider outpatient functional study to assess for high risk ischemia  3.  AKI: -Improving -Monitor with diuresis  4.  HTN: -Blood pressure currently reasonably controlled -Diuresis as above   For questions or updates, please contact [place] Please consult [email or web address] for contact info under Cardiology/STEMI.   Signed, Christell Faith, PA-C [place] Pager: [phone] 12/09/2019, 1:10 PM

## 2019-12-09 NOTE — Plan of Care (Signed)
Nutrition Education Note  RD consulted for nutrition education regarding CHF and DM.  Met with patient and her son at bedside. Patient reports she has a good appetite now and at baseline. She reports she typically eats 2-3 meals per day. She reports she may have fried chicken, mashed potatoes, beans, and other vegetables. She also occasionally has hotdogs. Patient drinks Pepsi, sweet tea, orange juice, milk, and water. She reports she limits other sweets in her diet. Encouraged patient to reduce intake of sugar-sweetened beverages. She reports she has not previously learned about low-sodium diet. Reviewed ways to decrease sodium in patient's diet. Encouraged patient to weigh herself daily.  RD provided "Low Sodium Nutrition Therapy" handout from the Academy of [employer] and [employer]etics. Reviewed patient's dietary recall. Provided examples on ways to decrease sodium intake in diet. Discouraged intake of processed foods and use of salt shaker. Encouraged fresh fruits and vegetables as well as whole grain sources of carbohydrates to maximize fiber intake.   RD discussed why it is important for patient to adhere to diet recommendations, and emphasized the role of fluids, foods to avoid, and importance of weighing self daily. Teach back method used.  No results found for: HGBA1C  RD provided "Carbohydrate Counting for People with Diabetes" handout from the Academy of Nutrition and Dietetics. Discussed different food groups and their effects on blood sugar, emphasizing carbohydrate-containing foods. Provided list of carbohydrates and recommended serving sizes of common foods.  Discussed importance of controlled and consistent carbohydrate intake throughout the day. Provided examples of ways to balance meals/snacks and encouraged intake of high-fiber, whole grain complex carbohydrates. Teach back method used.  Expect fair to good compliance.  Body mass index is 43.16 kg/m. Pt meets criteria for obesity  class III based on current BMI.  Current diet order is heart healthy/carbohydrate modified, patient is consuming approximately 100% of meals at this time. Labs and medications reviewed. No further nutrition interventions warranted at this time. RD contact information provided. If additional nutrition issues arise, please re-consult RD.  Jacklynn Barnacle, MS, RD, LDN Pager number available on Amion

## 2019-12-10 DIAGNOSIS — I5030 Unspecified diastolic (congestive) heart failure: Secondary | ICD-10-CM | POA: Diagnosis present

## 2019-12-10 DIAGNOSIS — I10 Essential (primary) hypertension: Secondary | ICD-10-CM | POA: Diagnosis present

## 2019-12-10 DIAGNOSIS — I5033 Acute on chronic diastolic (congestive) heart failure: Secondary | ICD-10-CM | POA: Diagnosis present

## 2019-12-10 DIAGNOSIS — E119 Type 2 diabetes mellitus without complications: Secondary | ICD-10-CM

## 2019-12-10 DIAGNOSIS — R778 Other specified abnormalities of plasma proteins: Secondary | ICD-10-CM | POA: Diagnosis present

## 2019-12-10 LAB — BASIC METABOLIC PANEL
Anion gap: 11 (ref 5–15)
BUN: 43 mg/dL — ABNORMAL HIGH (ref 8–23)
CO2: 42 mmol/L — ABNORMAL HIGH (ref 22–32)
Calcium: 8.9 mg/dL (ref 8.9–10.3)
Chloride: 86 mmol/L — ABNORMAL LOW (ref 98–111)
Creatinine, Ser: 1.27 mg/dL — ABNORMAL HIGH (ref 0.44–1.00)
GFR calc Af Amer: 46 mL/min — ABNORMAL LOW (ref >60)
GFR calc non Af Amer: 40 mL/min — ABNORMAL LOW (ref >60)
Glucose, Bld: 123 mg/dL — ABNORMAL HIGH (ref 70–99)
Potassium: 4 mmol/L (ref 3.5–5.1)
Sodium: 139 mmol/L (ref 135–145)

## 2019-12-10 LAB — GLUCOSE, CAPILLARY
Glucose-Capillary: 124 mg/dL — ABNORMAL HIGH (ref 70–99)
Glucose-Capillary: 129 mg/dL — ABNORMAL HIGH (ref 70–99)
Glucose-Capillary: 139 mg/dL — ABNORMAL HIGH (ref 70–99)
Glucose-Capillary: 143 mg/dL — ABNORMAL HIGH (ref 70–99)
Glucose-Capillary: 156 mg/dL — ABNORMAL HIGH (ref 70–99)
Glucose-Capillary: 177 mg/dL — ABNORMAL HIGH (ref 70–99)
Glucose-Capillary: 99 mg/dL (ref 70–99)

## 2019-12-10 LAB — CBC WITH DIFFERENTIAL/PLATELET
Abs Immature Granulocytes: 0.04 10*3/uL (ref 0.00–0.07)
Basophils Absolute: 0 10*3/uL (ref 0.0–0.1)
Basophils Relative: 0 %
Eosinophils Absolute: 0.1 10*3/uL (ref 0.0–0.5)
Eosinophils Relative: 1 %
HCT: 39.1 % (ref 36.0–46.0)
Hemoglobin: 12 g/dL (ref 12.0–15.0)
Immature Granulocytes: 0 %
Lymphocytes Relative: 14 %
Lymphs Abs: 1.6 10*3/uL (ref 0.7–4.0)
MCH: 26.4 pg (ref 26.0–34.0)
MCHC: 30.7 g/dL (ref 30.0–36.0)
MCV: 85.9 fL (ref 80.0–100.0)
Monocytes Absolute: 1.4 10*3/uL — ABNORMAL HIGH (ref 0.1–1.0)
Monocytes Relative: 13 %
Neutro Abs: 8 10*3/uL — ABNORMAL HIGH (ref 1.7–7.7)
Neutrophils Relative %: 72 %
Platelets: 427 10*3/uL — ABNORMAL HIGH (ref 150–400)
RBC: 4.55 MIL/uL (ref 3.87–5.11)
RDW: 15.4 % (ref 11.5–15.5)
WBC: 11.1 10*3/uL — ABNORMAL HIGH (ref 4.0–10.5)
nRBC: 0 % (ref 0.0–0.2)

## 2019-12-10 LAB — MAGNESIUM: Magnesium: 1.8 mg/dL (ref 1.7–2.4)

## 2019-12-10 LAB — PHOSPHORUS: Phosphorus: 4.9 mg/dL — ABNORMAL HIGH (ref 2.5–4.6)

## 2019-12-10 MED ORDER — IPRATROPIUM-ALBUTEROL 0.5-2.5 (3) MG/3ML IN SOLN
3.0000 mL | Freq: Four times a day (QID) | RESPIRATORY_TRACT | Status: DC | PRN
  Administered 2019-12-10 (×2): 3 mL via RESPIRATORY_TRACT
  Filled 2019-12-10 (×2): qty 3

## 2019-12-10 MED ORDER — ASPIRIN EC 81 MG PO TBEC
81.0000 mg | DELAYED_RELEASE_TABLET | Freq: Every day | ORAL | Status: DC
  Administered 2019-12-10 – 2019-12-16 (×7): 81 mg via ORAL
  Filled 2019-12-10 (×7): qty 1

## 2019-12-10 MED ORDER — DM-GUAIFENESIN ER 30-600 MG PO TB12
1.0000 | ORAL_TABLET | Freq: Two times a day (BID) | ORAL | Status: DC
  Administered 2019-12-10 – 2019-12-13 (×4): 1 via ORAL
  Filled 2019-12-10 (×9): qty 1

## 2019-12-10 MED ORDER — PRAVASTATIN SODIUM 20 MG PO TABS
20.0000 mg | ORAL_TABLET | Freq: Every day | ORAL | Status: DC
  Administered 2019-12-10 – 2019-12-15 (×5): 20 mg via ORAL
  Filled 2019-12-10 (×7): qty 1

## 2019-12-10 MED ORDER — LEVOTHYROXINE SODIUM 50 MCG PO TABS
75.0000 ug | ORAL_TABLET | Freq: Every day | ORAL | Status: DC
  Administered 2019-12-11 – 2019-12-16 (×6): 75 ug via ORAL
  Filled 2019-12-10 (×6): qty 1

## 2019-12-10 MED ORDER — FUROSEMIDE 10 MG/ML IJ SOLN: 60.0000 mg | Freq: Two times a day (BID) | INTRAMUSCULAR | Status: DC

## 2019-12-10 MED ORDER — PANTOPRAZOLE SODIUM 40 MG PO TBEC
40.0000 mg | DELAYED_RELEASE_TABLET | Freq: Every day | ORAL | Status: DC
  Administered 2019-12-10 – 2019-12-16 (×7): 40 mg via ORAL
  Filled 2019-12-10 (×7): qty 1

## 2019-12-10 MED ORDER — FUROSEMIDE 10 MG/ML IJ SOLN
80.0000 mg | Freq: Every day | INTRAMUSCULAR | Status: DC
  Administered 2019-12-11 – 2019-12-12 (×2): 80 mg via INTRAVENOUS
  Filled 2019-12-10 (×2): qty 8

## 2019-12-10 NOTE — Progress Notes (Addendum)
PROGRESS NOTE    NIMSI MALES   TGP:[id number]  DOB: Aug 04, 1940  PCP: Dion Body, MD    DOA: 12/08/2019 LOS: 2   Brief Narrative   79 y.o. female with history of diabetes mellitus, hypertension, hyperlipidemia, and morbid obesity, presented to the ED on 12/08/19 for 6 week history of progressive dysnpea, lower extremity edema and generalized malaise and fatigue, with 20 lb weight gain in 2 months time despite compliance with her diuretics.  She had been seen by PCP where she was noted to be hypoxic with O2 sat of 71%, and she was sent to the ED.  Initially was admitted to ICU requiring Bipap and Levophed for BP support.  Cardiology consulted.  Patient's hemodynamics and respiratory status improved and hospitalist service assumed care on 12/10/19.     Assessment & Plan   Principal Problem:   Acute on chronic heart failure with preserved ejection fraction (HFpEF) (HCC) Active Problems:   Acute respiratory failure with hypoxia (HCC)   Elevated troponin   Type 2 diabetes mellitus (HCC)   Essential hypertension   Heart failure with preserved ejection fraction (HCC)   Acute respiratory failure with hypoxia - POA, secondary to Acute on chronic HFpEF -presented with hypoxia, new bilateral pleural effusions, pulmonary edema and generalized anasarca.  Was initially admitted to ICU requiring BiPAP and Levophed.  Echo showed normal EF and grade 2 diastolic dysfunction.  Improving with IV Lasix.  Cardiology is following.  Strict I/O's, daily weights.  Monitor renal function and electrolytes closely.  Needs cardiology follow up with [facility].  Type 2 diabetes -continue sliding scale NovoLog and CBGs.  Continue to hold her long-acting as sugars are in the 100s without it.  Essential hypertension -chronic, hypotensive on admission requiring Levophed.  Hypotension is improved but diastolic still in the 30N.  Continue holding antihypertensives.  Hyperlipidemia -continue  statin  Morbid obesity: Body mass index is 43.16 kg/m.  Complicates overall care and prognosis.  DVT prophylaxis: enoxaparin (LOVENOX) injection 40 mg Start: 12/08/19 2200 SCDs Start: 12/08/19 1401   Diet:  Diet Orders (From admission, onward)    Start     Ordered   12/09/19 1145  Diet heart healthy/carb modified Room service appropriate? Yes; Fluid consistency: Thin  Diet effective now       Question Answer Comment  Diet-HS Snack? Nothing   Room service appropriate? Yes   Fluid consistency: Thin      12/09/19 1144            Code Status: Full Code    Subjective 12/10/19    Patient seen at bedside this morning.  She reports improvement in her leg swelling but says there is still painful.  She does continue to have some shortness of breath at rest but says this is better.  Denies fevers or chills or chest pain.  No acute events reported.   Disposition Plan & Communication   Status is: Inpatient  Remains inpatient appropriate because:Inpatient level of care appropriate due to severity of illness   Dispo: The patient is from: Home              Anticipated d/c is to: Home              Anticipated d/c date is: 3 days              Patient currently is not medically stable to d/c.        Family Communication: spoke with daughter, Lambert Keto, by phone  this afternoon.   Consults, Procedures, Significant Events   Consultants:   Cardiology  Procedures:   Echo EF 60 to 49%, grade 1 diastolic dysfunction    Objective   Vitals:   12/10/19 0719 12/10/19 0811 12/10/19 0855 12/10/19 1159  BP:  (!) 112/51  (!) 112/58  Pulse:  62 68 67  Resp:  17 (!) 21 18  Temp:  97.9 F (36.6 C)  98.3 F (36.8 C)  TempSrc:  Oral  Oral  SpO2: 92% 92% 96% (!) 89%  Weight:      Height:        Intake/Output Summary (Last 24 hours) at 12/10/2019 1508 Last data filed at 12/10/2019 1432 Gross per 24 hour  Intake 880.51 ml  Output 2375 ml  Net -1494.49 ml   Filed Weights    12/08/19 1013  Weight: 100.2 kg    Physical Exam:  General exam: awake, alert, no acute distress, obese HEENT: atraumatic, clear conjunctiva, anicteric sclera, moist mucus membranes, hearing grossly normal  Respiratory system: Bilateral crackles, diminished bases, no wheezes or rhonchi, mildly increased respiratory effort. Cardiovascular system: normal S1/S2, RRR, 1-2+ lower extremity pitting edema.   Gastrointestinal system: soft, NT, ND, no HSM felt, +bowel sounds. Central nervous system: A&O x4. no gross focal neurologic deficits, normal speech Extremities: moves all, no cyanosis, normal tone Skin: dry, intact, normal temperature Psychiatry: normal mood, congruent affect, judgement and insight appear normal  Labs   Data Reviewed: I have personally reviewed following labs and imaging studies  CBC: Recent Labs  Lab 12/08/19 1036 12/09/19 0400 12/10/19 0431  WBC 10.8* 8.6 11.1*  NEUTROABS  --   --  8.0*  HGB 12.0 12.7 12.0  HCT 40.2 43.2 39.1  MCV 86.3 88.5 85.9  PLT 431* 418* 702*   Basic Metabolic Panel: Recent Labs  Lab 12/08/19 1036 12/09/19 0400 12/10/19 0431  NA 133* 133* 139  K 4.4 4.8 4.0  CL 83* 85* 86*  CO2 40* 37* 42*  GLUCOSE 122* 152* 123*  BUN 41* 36* 43*  CREATININE 1.16* 1.03* 1.27*  CALCIUM 9.2 8.9 8.9  MG  --   --  1.8  PHOS  --   --  4.9*   GFR: Estimated Creatinine Clearance: 38.2 mL/min (A) (by C-G formula based on SCr of 1.27 mg/dL (H)). Liver Function Tests: Recent Labs  Lab 12/08/19 1036  AST 16  ALT 13  ALKPHOS 50  BILITOT 0.6  PROT 6.5  ALBUMIN 3.4*   No results for input(s): LIPASE, AMYLASE in the last 168 hours. No results for input(s): AMMONIA in the last 168 hours. Coagulation Profile: No results for input(s): INR, PROTIME in the last 168 hours. Cardiac Enzymes: No results for input(s): CKTOTAL, CKMB, CKMBINDEX, TROPONINI in the last 168 hours. BNP (last 3 results) No results for input(s): PROBNP in the last 8760  hours. HbA1C: No results for input(s): HGBA1C in the last 72 hours. CBG: Recent Labs  Lab 12/09/19 2024 12/10/19 0001 12/10/19 0400 12/10/19 0753 12/10/19 1146  GLUCAP 168* 143* 124* 99 156*   Lipid Profile: No results for input(s): CHOL, HDL, LDLCALC, TRIG, CHOLHDL, LDLDIRECT in the last 72 hours. Thyroid Function Tests: No results for input(s): TSH, T4TOTAL, FREET4, T3FREE, THYROIDAB in the last 72 hours. Anemia Panel: No results for input(s): VITAMINB12, FOLATE, FERRITIN, TIBC, IRON, RETICCTPCT in the last 72 hours. Sepsis Labs: Recent Labs  Lab 12/08/19 1036 12/08/19 1125  PROCALCITON <0.10  --   LATICACIDVEN  --  1.9  Recent Results (from the past 240 hour(s))  SARS Coronavirus 2 by RT PCR (hospital order, performed in [facility] hospital lab) Nasopharyngeal Nasopharyngeal Swab     Status: None   Collection Time: 12/08/19 11:25 AM   Specimen: Nasopharyngeal Swab  Result Value Ref Range Status   SARS Coronavirus 2 NEGATIVE NEGATIVE Final    Comment: (NOTE) SARS-CoV-2 target nucleic acids are NOT DETECTED.  The SARS-CoV-2 RNA is generally detectable in upper and lower respiratory specimens during the acute phase of infection. The lowest concentration of SARS-CoV-2 viral copies this assay can detect is 250 copies / mL. A negative result does not preclude SARS-CoV-2 infection and should not be used as the sole basis for treatment or other patient management decisions.  A negative result may occur with improper specimen collection / handling, submission of specimen other than nasopharyngeal swab, presence of viral mutation(s) within the areas targeted by this assay, and inadequate number of viral copies (<250 copies / mL). A negative result must be combined with clinical observations, patient history, and epidemiological information.  Fact Sheet for Patients:   StrictlyIdeas.no  Fact Sheet for Healthcare  Providers: BankingDealers.co.za  This test is not yet approved or  cleared by the [place] FDA and has been authorized for detection and/or diagnosis of SARS-CoV-2 by FDA under an Emergency Use Authorization (EUA).  This EUA will remain in effect (meaning this test can be used) for the duration of the COVID-19 declaration under Section 564(b)(1) of the Act, 21 U.S.C. section 360bbb-3(b)(1), unless the authorization is terminated or revoked sooner.  Performed at [facility], [address], [place], [place] [address]   Urine culture     Status: None   Collection Time: 12/08/19 11:28 AM   Specimen: Urine, Random  Result Value Ref Range Status   Specimen Description   Final    URINE, RANDOM Performed at [facility], [address] [place], [place] [address]    Special Requests   Final    NONE Performed at [facility], [address] [place], [place] [address]    Culture   Final    NO GROWTH Performed at [facility], [place] [address], [place], [place] [address]    Report Status 12/09/2019 FINAL  Final  MRSA PCR Screening     Status: None   Collection Time: 12/08/19  4:37 PM   Specimen: Nasopharyngeal  Result Value Ref Range Status   MRSA by PCR NEGATIVE NEGATIVE Final    Comment:        The GeneXpert MRSA Assay (FDA approved for NASAL specimens only), is one component of a comprehensive MRSA colonization surveillance program. It is not intended to diagnose MRSA infection nor to guide or monitor treatment for MRSA infections. Performed at [facility], [address] [place], [place] [address]       Imaging Studies   CT ABDOMEN PELVIS WO CONTRAST  Result Date: 12/09/2019 CLINICAL DATA:  Abdominal pain. Concern for probable liver failure and renal failure associated with severe hypoglycemia. EXAM: CT ABDOMEN AND PELVIS WITHOUT CONTRAST TECHNIQUE: Multidetector CT imaging of the abdomen and  pelvis was performed following the standard protocol without IV contrast. COMPARISON:  None. FINDINGS: Lower chest: There are small to moderate bilateral pleural effusions. Consolidation/atelectasis of the left lower lobe is noted. Hepatobiliary: No focal liver abnormality identified. Calcified stones are identified layering within the dependent portion of the gallbladder. No biliary ductal dilatation identified. Pancreas: Unremarkable. No pancreatic ductal  dilatation or surrounding inflammatory changes. Spleen: Normal in size without focal abnormality. Adrenals/Urinary Tract: Normal appearance of the adrenal glands. Bilateral kidney cysts. Incompletely characterized without IV contrast. The largest is exophytic arising from the inferior pole of left kidney measuring 7.6 x 6.5 cm. No nephrolithiasis or hydronephrosis. The urinary bladder is normal. Stomach/Bowel: Stomach appears normal. There is no bowel wall thickening, inflammation or distension. Colonic diverticulosis noted. The appendix is visualized and appears normal. Vascular/Lymphatic: Aortic atherosclerosis. No aneurysm. No abdominopelvic adenopathy. Reproductive: Status post hysterectomy. No adnexal masses. Other: Trace ascites noted within the abdomen and pelvis. No focal fluid collections. Musculoskeletal: Skin thickening and subcutaneous fat stranding identified throughout the body wall compatible with anasarca. Mild degenerative disc disease within the thoracolumbar spine. Multi level, bilateral facet arthropathy identified within the lumbar spine. Mild bilateral hip osteoarthritis. IMPRESSION: 1. No acute findings identified within the abdomen or pelvis. 2. Bilateral kidney cysts. Incompletely characterized without IV contrast. 3. Gallstones. 4. Small to moderate bilateral pleural effusions with left lower lobe atelectasis/consolidation. 5. Anasarca. 6. Aortic atherosclerosis. Aortic Atherosclerosis (ICD10-I70.0). Electronically Signed   By: Kerby Moors M.D.   On: 12/09/2019 09:31   ECHOCARDIOGRAM COMPLETE  Result Date: 12/09/2019    ECHOCARDIOGRAM REPORT   Patient Name:   ALVIS PULCINI Dimaggio Date of Exam: 12/08/2019 Medical Rec #:  [id number]             Height:       60.0 in Accession #:    [id number]            Weight:       221.0 lb Date of Birth:  Sep 02, 1940             BSA:          1.948 m Patient Age:    58 years              BP:           131/72 mmHg Patient Gender: F                     HR:           58 bpm. Exam Location:  [facility] Procedure: 2D Echo, Cardiac Doppler and Color Doppler Indications:     Acute Respiratory Insufficiency 518.82 / R06.89  History:         Patient has no prior history of Echocardiogram examinations.                  Risk Factors:Hypertension and Diabetes.  Sonographer:     Alyse Low Roar Referring Phys:  [id number] Flora Lipps Diagnosing Phys: Nelva Bush MD IMPRESSIONS  1. Left ventricular ejection fraction, by estimation, is 60 to 65%. The left ventricle has normal function. The left ventricle has no regional wall motion abnormalities. There is mild left ventricular hypertrophy. Left ventricular diastolic parameters are consistent with Grade II diastolic dysfunction (pseudonormalization). Elevated left atrial pressure.  2. Pulmonary artery pressure is mildly to moderately elevated (RVSP 35 mmHg plus central venous pressure). Right ventricular systolic function is normal. The right ventricular size is mildly enlarged. Mildly increased right ventricular wall thickness.  3. The mitral valve is degenerative. Trivial mitral valve regurgitation. No evidence of mitral stenosis.  4. Tricuspid valve regurgitation is mild to moderate.  5. The aortic valve was not well visualized. Aortic valve regurgitation is not visualized. No aortic stenosis is present. FINDINGS  Left Ventricle: Left ventricular ejection fraction, by estimation, is 60 to  65%. The left ventricle has normal function. The left ventricle has no regional wall motion  abnormalities. The left ventricular internal cavity size was normal in size. There is  mild left ventricular hypertrophy. Left ventricular diastolic parameters are consistent with Grade II diastolic dysfunction (pseudonormalization). Elevated left atrial pressure. Right Ventricle: Pulmonary artery pressure is mildly to moderately elevated (RVSP 35 mmHg plus central venous pressure). The right ventricular size is mildly enlarged. Mildly increased right ventricular wall thickness. Right ventricular systolic function  is normal. Left Atrium: Left atrial size was normal in size. Right Atrium: Right atrial size was normal in size. Pericardium: The pericardium was not well visualized. Mitral Valve: The mitral valve is degenerative in appearance. Mild mitral annular calcification. Trivial mitral valve regurgitation. No evidence of mitral valve stenosis. Tricuspid Valve: The tricuspid valve is not well visualized. Tricuspid valve regurgitation is mild to moderate. Aortic Valve: The aortic valve was not well visualized. Aortic valve regurgitation is not visualized. No aortic stenosis is present. Aortic valve mean gradient measures 5.0 mmHg. Aortic valve peak gradient measures 9.6 mmHg. Aortic valve area, by VTI measures 1.99 cm. Pulmonic Valve: The pulmonic valve was not well visualized. Pulmonic valve regurgitation is not visualized. No evidence of pulmonic stenosis. Aorta: The aortic root is normal in size and structure. Pulmonary Artery: The pulmonary artery is not well seen. Venous: The inferior vena cava was not well visualized. IAS/Shunts: The interatrial septum was not well visualized. Additional Comments: There is pleural effusion in the left lateral region.  LEFT VENTRICLE PLAX 2D LVIDd:         4.53 cm  Diastology LVIDs:         3.17 cm  LV e' lateral:   8.92 cm/s LV PW:         1.11 cm  LV E/e' lateral: 11.4 LV IVS:        1.16 cm  LV e' medial:    5.98 cm/s LVOT diam:     1.80 cm  LV E/e' medial:  17.1 LV SV:          67 LV SV Index:   34 LVOT Area:     2.54 cm  RIGHT VENTRICLE RV Mid diam:    4.30 cm RV S prime:     13.90 cm/s TAPSE (M-mode): 2.9 cm LEFT ATRIUM             Index       RIGHT ATRIUM           Index LA diam:        4.00 cm 2.05 cm/m  RA Area:     16.70 cm LA Vol (A2C):   69.3 ml 35.58 ml/m RA Volume:   44.50 ml  22.85 ml/m LA Vol (A4C):   51.2 ml 26.29 ml/m LA Biplane Vol: 60.2 ml 30.91 ml/m  AORTIC VALVE                    PULMONIC VALVE AV Area (Vmax):    2.15 cm     PV Vmax:       1.02 m/s AV Area (Vmean):   1.89 cm     PV Peak grad:  4.2 mmHg AV Area (VTI):     1.99 cm AV Vmax:           155.00 cm/s AV Vmean:          102.000 cm/s AV VTI:  0.338 m AV Peak Grad:      9.6 mmHg AV Mean Grad:      5.0 mmHg LVOT Vmax:         131.00 cm/s LVOT Vmean:        75.900 cm/s LVOT VTI:          0.264 m LVOT/AV VTI ratio: 0.78  AORTA Ao Root diam: 2.70 cm MITRAL VALVE                TRICUSPID VALVE MV Area (PHT): 3.89 cm     TR Peak grad:   34.8 mmHg MV Decel Time: 195 msec     TR Vmax:        295.00 cm/s MV E velocity: 102.00 cm/s MV A velocity: 86.10 cm/s   SHUNTS MV E/A ratio:  1.18         Systemic VTI:  0.26 m MV A Prime:    10.9 cm/s    Systemic Diam: 1.80 cm Nelva Bush MD Electronically signed by Nelva Bush MD Signature Date/Time: 12/09/2019/10:09:34 AM    Final      Medications   Scheduled Meds: . aspirin EC  81 mg Oral Daily  . Chlorhexidine Gluconate Cloth  6 each Topical Daily  . dextromethorphan-guaiFENesin  1 tablet Oral BID  . enoxaparin (LOVENOX) injection  40 mg Subcutaneous BID  . [START ON 12/11/2019] furosemide  80 mg Intravenous Daily  . insulin aspart  0-24 Units Subcutaneous Q4H  . ipratropium-albuterol  3 mL Nebulization Once  . levothyroxine  75 mcg Oral Daily  . pantoprazole  40 mg Oral Daily  . potassium chloride  20 mEq Oral Daily  . pravastatin  20 mg Oral q1800  . sodium chloride flush  3 mL Intravenous Q12H   Continuous Infusions: . sodium  chloride Stopped (12/09/19 1414)       LOS: 2 days    Time spent: 38 minutes with greater than 50% in coordination of care in direct patient contact    Ezekiel Slocumb, DO [facility]  12/10/2019, 3:08 PM    If 7PM-7AM, please contact night-coverage. How to contact the [facility] Attending or Consulting provider [place] or covering provider during after hours [place], for this patient?    1. Check the care team in [facility] and look for a) attending/consulting [facility] provider listed and b) the [facility] team listed 2. Log into [email or web address] and use [facility]'s universal password to access. If you do not have the password, please contact the hospital operator. 3. Locate the [facility] provider you are looking for under [facility] and page to a number that you can be directly reached. 4. If you still have difficulty reaching the provider, please page the [facility] (Director on Call) for the Hospitalists listed on amion for assistance.

## 2019-12-10 NOTE — Progress Notes (Addendum)
Progress Note  Patient Name: Tammie [name] Date of Encounter: 12/10/2019  Primary Cardiologist: New to [facility] - consult by End  Subjective   Transferred out of the ICU to the floor. Breathing about the same. Documented UOP of 1.1 L for the past 24 hours with a net - 876 mL for the admission. No weight for review. Renal function with slight increase today following IV Lasix 80 gm x 1 on 6/29 with BUN/SCr 36/1.03-->43/1.27. Potassium at goal. She has received IV Lasix 80 mg this morning.   Inpatient Medications    Scheduled Meds: . aspirin EC  81 mg Oral Daily  . Chlorhexidine Gluconate Cloth  6 each Topical Daily  . dextromethorphan-guaiFENesin  1 tablet Oral BID  . enoxaparin (LOVENOX) injection  40 mg Subcutaneous BID  . furosemide  60 mg Intravenous Q12H  . insulin aspart  0-24 Units Subcutaneous Q4H  . ipratropium-albuterol  3 mL Nebulization Once  . levothyroxine  75 mcg Oral Daily  . pantoprazole  40 mg Oral Daily  . potassium chloride  20 mEq Oral Daily  . pravastatin  20 mg Oral q1800  . sodium chloride flush  3 mL Intravenous Q12H   Continuous Infusions: . sodium chloride Stopped (12/09/19 1414)  . famotidine (PEPCID) IV Stopped (12/10/19 0934)   PRN Meds: sodium chloride, acetaminophen, dextrose, docusate sodium, ipratropium-albuterol, ondansetron (ZOFRAN) IV, polyethylene glycol, sodium chloride flush   Vital Signs    Vitals:   12/10/19 0324 12/10/19 0719 12/10/19 0811 12/10/19 0855  BP: (!) 107/51  (!) 112/51   Pulse: 62  62 68  Resp: 19  17 (!) 21  Temp: 97.7 F (36.5 C)  97.9 F (36.6 C)   TempSrc: Oral  Oral   SpO2: 91% 92% 92% 96%  Weight:      Height:        Intake/Output Summary (Last 24 hours) at 12/10/2019 1111 Last data filed at 12/10/2019 1045 Gross per 24 hour  Intake 1360.51 ml  Output 2400 ml  Net -1039.49 ml   Filed Weights   12/08/19 1013  Weight: 100.2 kg    Telemetry    SR with rare PACs/PVCs - Personally  Reviewed  ECG    No new tracings - Personally Reviewed  Physical Exam   GEN: No acute distress.   Neck: JVD difficult to assess secondary to body habitus. Cardiac: RRR, no murmurs, rubs, or gallops.  Respiratory: Diminished breath sounds and faint crackles along the bases bilaterally.  GI: Soft, nontender, non-distended.   MS: 1-2+ bilateral lower extremity pitting edema; No deformity. Neuro:  Alert and oriented x 3; Nonfocal.  Psych: Normal affect.  Labs    Chemistry Recent Labs  Lab 12/08/19 1036 12/09/19 0400 12/10/19 0431  NA 133* 133* 139  K 4.4 4.8 4.0  CL 83* 85* 86*  CO2 40* 37* 42*  GLUCOSE 122* 152* 123*  BUN 41* 36* 43*  CREATININE 1.16* 1.03* 1.27*  CALCIUM 9.2 8.9 8.9  PROT 6.5  --   --   ALBUMIN 3.4*  --   --   AST 16  --   --   ALT 13  --   --   ALKPHOS 50  --   --   BILITOT 0.6  --   --   GFRNONAA 45* 52* 40*  GFRAA 52* 60* 46*  ANIONGAP 10 11 11      Hematology Recent Labs  Lab 12/08/19 1036 12/09/19 0400 12/10/19 0431  WBC 10.8* 8.6  11.1*  RBC 4.66 4.88 4.55  HGB 12.0 12.7 12.0  HCT 40.2 43.2 39.1  MCV 86.3 88.5 85.9  MCH 25.8* 26.0 26.4  MCHC 29.9* 29.4* 30.7  RDW 15.0 15.0 15.4  PLT 431* 418* 427*    Cardiac EnzymesNo results for input(s): TROPONINI in the last 168 hours. No results for input(s): TROPIPOC in the last 168 hours.   BNP Recent Labs  Lab 12/08/19 1036  BNP 395.6*     DDimer No results for input(s): DDIMER in the last 168 hours.   Radiology    CT ABDOMEN PELVIS WO CONTRAST  Result Date: 12/09/2019 IMPRESSION: 1. No acute findings identified within the abdomen or pelvis. 2. Bilateral kidney cysts. Incompletely characterized without IV contrast. 3. Gallstones. 4. Small to moderate bilateral pleural effusions with left lower lobe atelectasis/consolidation. 5. Anasarca. 6. Aortic atherosclerosis. Aortic Atherosclerosis (ICD10-I70.0). Electronically Signed   By: Kerby Moors M.D.   On: 12/09/2019 09:31   CT  Chest Wo Contrast  Result Date: 12/08/2019 IMPRESSION: Bilateral pleural effusions left greater than right with associated atelectatic changes. T9 compression deformity which appears chronic with increased sclerosis. No other focal abnormality is noted. Aortic Atherosclerosis (ICD10-I70.0). Electronically Signed   By: Inez Catalina M.D.   On: 12/08/2019 13:10    Cardiac Studies   2D echo 12/08/2019: 1. Left ventricular ejection fraction, by estimation, is 60 to 65%. The  left ventricle has normal function. The left ventricle has no regional  wall motion abnormalities. There is mild left ventricular hypertrophy.  Left ventricular diastolic parameters  are consistent with Grade II diastolic dysfunction (pseudonormalization).  Elevated left atrial pressure.  2. Pulmonary artery pressure is mildly to moderately elevated (RVSP 35  mmHg plus central venous pressure). Right ventricular systolic function is  normal. The right ventricular size is mildly enlarged. Mildly increased  right ventricular wall thickness.  3. The mitral valve is degenerative. Trivial mitral valve regurgitation.  No evidence of mitral stenosis.  4. Tricuspid valve regurgitation is mild to moderate.  5. The aortic valve was not well visualized. Aortic valve regurgitation  is not visualized. No aortic stenosis is present. __________  2D echo 12/01/2019: 2D echo 12/01/2019 performed at [facility]: INTERPRETATION  NORMAL LEFT VENTRICULAR SYSTOLIC FUNCTION  NORMAL RIGHT VENTRICULAR SYSTOLIC FUNCTION  MILD VALVULAR REGURGITATION (See above)  NO VALVULAR STENOSIS  ECHOCARDIOGRAPHIC DESCRIPTIONS  AORTIC ROOT          Size: Normal       Dissection: INDETERM FOR DISSECTION  AORTIC VALVE        Leaflets: Tricuspid          Morphology: Normal        Mobility: Fully mobile  LEFT VENTRICLE          Size: Normal            Anterior: Normal       Contraction: Normal             Lateral: Normal       Closest EF: >55% (Estimated)        Septal: Normal       LV Masses: No Masses            Apical: Normal          LVH: None             Inferior: Normal  Posterior: Normal      Dias.FxClass: (Grade 2) relaxation abnormal, pseudonormal  MITRAL VALVE        Leaflets: Normal            Mobility: Fully mobile       Morphology: Normal  LEFT ATRIUM          Size: MILDLY ENLARGED       LA Masses: No masses       IA Septum: Normal IAS  MAIN PA          Size: Normal  PULMONIC VALVE       Morphology: Normal            Mobility: Fully mobile  RIGHT VENTRICLE       RV Masses: No Masses             Size: Normal       Free Wall: Normal          Contraction: Normal  TRICUSPID VALVE        Leaflets: Normal            Mobility: Fully mobile       Morphology: Normal  RIGHT ATRIUM          Size: MILDLY ENLARGED        RA Other: None        RA Mass: No masses  PERICARDIUM         Fluid: No effusion  INFERIOR VENACAVA          Size: Normal Normal respiratory collapse   Patient Profile     79 y.o. female with history of insulin-dependent diabetes mellitus, hypertension, hyperlipidemia, cervical/thoracic/lumbar osteoarthritis, and obesity who is being seen today for the evaluation of acute on chronic HFpEF at the request of Dr. Mortimer Fries.  Assessment & Plan    1. Acute on chronic HFpEF/pulmonary hypertension: -She remains volume overloaded the patient reported dry weight of approximately 206-208 pounds with a current weight of 226 pounds -Likely secondary to obesity with likely OSA and OHS -Transitioned from torsemide to IV Lasix 80 mg daily on 6/29 with  reasonable documented UOP, though escalation in her renal function was noted this morning -Keep IV Lasix at 80 mg daily -Monitor renal function closely, may need to de-escalate IV Lasix on 7/1 -With bump in renal function, and continued symptoms, may need to consider RHC based on renal function, UOP and symptoms -Pending renal function at that time, could consider LHC as well -Patient will need outpatient sleep study -CHF education -Daily weights -Strict ins and outs -Unable to place ReDs vest secondary to BMI  2.  Elevated troponin: -Never with symptoms of chest pain -Minimally elevated likely secondary to supply demand ischemia in the setting of volume overload with mild AKI -No indication for heparin drip, not consistent with ACS -ASA -Consider outpatient functional study to assess for high risk ischemia vs LHC based on the above  3.  AKI: -Slight bump in renal function this morning -Monitor with diuresis  4.  HTN: -Blood pressure has been on the soft side -Monitor with diuresis   For questions or updates, please contact [place] Please consult [email or web address] for contact info under Cardiology/STEMI.    Signed, Christell Faith, PA-C [place] Pager: [phone] 12/10/2019, 11:11 AM

## 2019-12-10 NOTE — Hospital Course (Signed)
79 y.o. female with history of diabetes mellitus, hypertension, hyperlipidemia, and morbid obesity, presented to the ED on 12/08/19 for 6 week history of progressive dysnpea, lower extremity edema and generalized malaise and fatigue, with 20 lb weight gain in 2 months time despite compliance with her diuretics.  She had been seen by PCP where she was noted to be hypoxic with O2 sat of 71%, and she was sent to the ED.  Initially was admitted to ICU requiring Bipap and Levophed for BP support.  Cardiology consulted.  Patient's hemodynamics and respiratory status improved and hospitalist service assumed care on 12/10/19.

## 2019-12-11 ENCOUNTER — Inpatient Hospital Stay: Payer: Medicare HMO

## 2019-12-11 DIAGNOSIS — J9602 Acute respiratory failure with hypercapnia: Secondary | ICD-10-CM

## 2019-12-11 DIAGNOSIS — I5033 Acute on chronic diastolic (congestive) heart failure: Secondary | ICD-10-CM

## 2019-12-11 DIAGNOSIS — I10 Essential (primary) hypertension: Secondary | ICD-10-CM

## 2019-12-11 DIAGNOSIS — R778 Other specified abnormalities of plasma proteins: Secondary | ICD-10-CM

## 2019-12-11 DIAGNOSIS — J9601 Acute respiratory failure with hypoxia: Secondary | ICD-10-CM

## 2019-12-11 LAB — CBC WITH DIFFERENTIAL/PLATELET
Abs Immature Granulocytes: 0.03 10*3/uL (ref 0.00–0.07)
Basophils Absolute: 0.1 10*3/uL (ref 0.0–0.1)
Basophils Relative: 1 %
Eosinophils Absolute: 0.2 10*3/uL (ref 0.0–0.5)
Eosinophils Relative: 2 %
HCT: 37.7 % (ref 36.0–46.0)
Hemoglobin: 11.4 g/dL — ABNORMAL LOW (ref 12.0–15.0)
Immature Granulocytes: 0 %
Lymphocytes Relative: 15 %
Lymphs Abs: 1.2 10*3/uL (ref 0.7–4.0)
MCH: 25.9 pg — ABNORMAL LOW (ref 26.0–34.0)
MCHC: 30.2 g/dL (ref 30.0–36.0)
MCV: 85.7 fL (ref 80.0–100.0)
Monocytes Absolute: 1 10*3/uL (ref 0.1–1.0)
Monocytes Relative: 12 %
Neutro Abs: 6 10*3/uL (ref 1.7–7.7)
Neutrophils Relative %: 70 %
Platelets: 377 10*3/uL (ref 150–400)
RBC: 4.4 MIL/uL (ref 3.87–5.11)
RDW: 15.4 % (ref 11.5–15.5)
WBC: 8.4 10*3/uL (ref 4.0–10.5)
nRBC: 0 % (ref 0.0–0.2)

## 2019-12-11 LAB — GLUCOSE, CAPILLARY
Glucose-Capillary: 134 mg/dL — ABNORMAL HIGH (ref 70–99)
Glucose-Capillary: 133 mg/dL — ABNORMAL HIGH (ref 70–99)
Glucose-Capillary: 168 mg/dL — ABNORMAL HIGH (ref 70–99)
Glucose-Capillary: 126 mg/dL — ABNORMAL HIGH (ref 70–99)
Glucose-Capillary: 187 mg/dL — ABNORMAL HIGH (ref 70–99)

## 2019-12-11 LAB — BASIC METABOLIC PANEL
Anion gap: 10 (ref 5–15)
BUN: 34 mg/dL — ABNORMAL HIGH (ref 8–23)
CO2: 40 mmol/L — ABNORMAL HIGH (ref 22–32)
Calcium: 8.9 mg/dL (ref 8.9–10.3)
Chloride: 90 mmol/L — ABNORMAL LOW (ref 98–111)
Creatinine, Ser: 1.02 mg/dL — ABNORMAL HIGH (ref 0.44–1.00)
GFR calc Af Amer: >60 mL/min (ref >60)
GFR calc non Af Amer: 52 mL/min — ABNORMAL LOW (ref >60)
Glucose, Bld: 148 mg/dL — ABNORMAL HIGH (ref 70–99)
Potassium: 4.1 mmol/L (ref 3.5–5.1)
Sodium: 140 mmol/L (ref 135–145)

## 2019-12-11 LAB — PHOSPHORUS: Phosphorus: 3.5 mg/dL (ref 2.5–4.6)

## 2019-12-11 LAB — MAGNESIUM: Magnesium: 1.7 mg/dL (ref 1.7–2.4)

## 2019-12-11 MED ORDER — MAGNESIUM SULFATE 2 GM/50ML IV SOLN
2.0000 g | Freq: Once | INTRAVENOUS | Status: AC
  Administered 2019-12-11: 12:00:00 2 g via INTRAVENOUS
  Filled 2019-12-11: qty 50

## 2019-12-11 NOTE — Progress Notes (Signed)
Progress Note  Patient Name: Tammie [name] Date of Encounter: 12/11/2019  Primary Cardiologist: New to [facility] - consult by End  Subjective   Dyspnea continues to improve. No chest pain or palpitations. Supplemental oxygen has been weaned to 4 L via nasal cannula currently. Documented UOP of 1.1 L for the past 24 hours with a net - 1.6 L for the past 24 hours with a net - 2.6 L for the admission. Weight 100.2--99.7. Renal function improving with BUN/SCr 43/1.27-->34/1.02. Potassium at goal. She remains on IV Lasix 80 mg daily.   Inpatient Medications    Scheduled Meds: . aspirin EC  81 mg Oral Daily  . Chlorhexidine Gluconate Cloth  6 each Topical Daily  . dextromethorphan-guaiFENesin  1 tablet Oral BID  . enoxaparin (LOVENOX) injection  40 mg Subcutaneous BID  . furosemide  80 mg Intravenous Daily  . insulin aspart  0-24 Units Subcutaneous Q4H  . ipratropium-albuterol  3 mL Nebulization Once  . levothyroxine  75 mcg Oral Daily  . pantoprazole  40 mg Oral Daily  . potassium chloride  20 mEq Oral Daily  . pravastatin  20 mg Oral q1800  . sodium chloride flush  3 mL Intravenous Q12H   Continuous Infusions: . sodium chloride Stopped (12/09/19 1414)  . magnesium sulfate bolus IVPB     PRN Meds: sodium chloride, acetaminophen, dextrose, docusate sodium, ipratropium-albuterol, ondansetron (ZOFRAN) IV, polyethylene glycol, sodium chloride flush   Vital Signs    Vitals:   12/11/19 0426 12/11/19 0428 12/11/19 0747 12/11/19 0750  BP: (!) 130/59  129/61 129/61  Pulse: (!) 59  62 66  Resp: 16  16 16   Temp: 98 F (36.7 C)  98.2 F (36.8 C) 98.2 F (36.8 C)  TempSrc: Oral     SpO2: 93%  96% 96%  Weight:  99.7 kg    Height:        Intake/Output Summary (Last 24 hours) at 12/11/2019 1012 Last data filed at 12/11/2019 0428 Gross per 24 hour  Intake 480 ml  Output 1975 ml  Net -1495 ml   Filed Weights   12/08/19 1013 12/11/19 0428  Weight: 100.2 kg 99.7 kg     Telemetry    SR with PACs/PVCs, 6 beat run of NSVT - Personally Reviewed  ECG    No new tracings - Personally Reviewed  Physical Exam   GEN: No acute distress.   Neck: JVD difficult to assess secondary to body habitus and limited cervical spine range of motion. Cardiac: RRR, no murmurs, rubs, or gallops.  Respiratory: Diminished breath sounds and faint crackles along the bases bilaterally. Supplemental oxygen via nasal cannula at 4 L.  GI: Soft, nontender, non-distended.   MS: Improved trace bilateral lower extremity edema; No deformity. Neuro:  Alert and oriented x 3; Nonfocal.  Psych: Normal affect.  Labs    Chemistry Recent Labs  Lab 12/08/19 1036 12/08/19 1036 12/09/19 0400 12/10/19 0431 12/11/19 0526  NA 133*   < > 133* 139 140  K 4.4   < > 4.8 4.0 4.1  CL 83*   < > 85* 86* 90*  CO2 40*   < > 37* 42* 40*  GLUCOSE 122*   < > 152* 123* 148*  BUN 41*   < > 36* 43* 34*  CREATININE 1.16*   < > 1.03* 1.27* 1.02*  CALCIUM 9.2   < > 8.9 8.9 8.9  PROT 6.5  --   --   --   --  ALBUMIN 3.4*  --   --   --   --   AST 16  --   --   --   --   ALT 13  --   --   --   --   ALKPHOS 50  --   --   --   --   BILITOT 0.6  --   --   --   --   GFRNONAA 45*   < > 52* 40* 52*  GFRAA 52*   < > 60* 46* >60  ANIONGAP 10   < > 11 11 10    < > = values in this interval not displayed.     Hematology Recent Labs  Lab 12/09/19 0400 12/10/19 0431 12/11/19 0526  WBC 8.6 11.1* 8.4  RBC 4.88 4.55 4.40  HGB 12.7 12.0 11.4*  HCT 43.2 39.1 37.7  MCV 88.5 85.9 85.7  MCH 26.0 26.4 25.9*  MCHC 29.4* 30.7 30.2  RDW 15.0 15.4 15.4  PLT 418* 427* 377    Cardiac EnzymesNo results for input(s): TROPONINI in the last 168 hours. No results for input(s): TROPIPOC in the last 168 hours.   BNP Recent Labs  Lab 12/08/19 1036  BNP 395.6*     DDimer No results for input(s): DDIMER in the last 168 hours.   Radiology    CT ABDOMEN PELVIS WO CONTRAST  Result Date: 12/09/2019 IMPRESSION:  1. No acute findings identified within the abdomen or pelvis. 2. Bilateral kidney cysts. Incompletely characterized without IV contrast. 3. Gallstones. 4. Small to moderate bilateral pleural effusions with left lower lobe atelectasis/consolidation. 5. Anasarca. 6. Aortic atherosclerosis. Aortic Atherosclerosis (ICD10-I70.0). Electronically Signed   By: Kerby Moors M.D.   On: 12/09/2019 09:31   CT Chest Wo Contrast  Result Date: 12/08/2019 IMPRESSION: Bilateral pleural effusions left greater than right with associated atelectatic changes. T9 compression deformity which appears chronic with increased sclerosis. No other focal abnormality is noted. Aortic Atherosclerosis (ICD10-I70.0). Electronically Signed   By: Inez Catalina M.D.   On: 12/08/2019 13:10    Cardiac Studies   2D echo 12/08/2019: 1. Left ventricular ejection fraction, by estimation, is 60 to 65%. The  left ventricle has normal function. The left ventricle has no regional  wall motion abnormalities. There is mild left ventricular hypertrophy.  Left ventricular diastolic parameters  are consistent with Grade II diastolic dysfunction (pseudonormalization).  Elevated left atrial pressure.  2. Pulmonary artery pressure is mildly to moderately elevated (RVSP 35  mmHg plus central venous pressure). Right ventricular systolic function is  normal. The right ventricular size is mildly enlarged. Mildly increased  right ventricular wall thickness.  3. The mitral valve is degenerative. Trivial mitral valve regurgitation.  No evidence of mitral stenosis.  4. Tricuspid valve regurgitation is mild to moderate.  5. The aortic valve was not well visualized. Aortic valve regurgitation  is not visualized. No aortic stenosis is present. __________  2D echo 12/01/2019: 2D echo 12/01/2019 performed at [facility]: INTERPRETATION  NORMAL LEFT VENTRICULAR SYSTOLIC FUNCTION  NORMAL RIGHT VENTRICULAR SYSTOLIC FUNCTION  MILD VALVULAR  REGURGITATION (See above)  NO VALVULAR STENOSIS  ECHOCARDIOGRAPHIC DESCRIPTIONS  AORTIC ROOT          Size: Normal       Dissection: INDETERM FOR DISSECTION  AORTIC VALVE        Leaflets: Tricuspid          Morphology: Normal        Mobility: Fully mobile  LEFT VENTRICLE  Size: Normal            Anterior: Normal      Contraction: Normal             Lateral: Normal       Closest EF: >55% (Estimated)        Septal: Normal       LV Masses: No Masses            Apical: Normal          LVH: None             Inferior: Normal                           Posterior: Normal      Dias.FxClass: (Grade 2) relaxation abnormal, pseudonormal  MITRAL VALVE        Leaflets: Normal            Mobility: Fully mobile       Morphology: Normal  LEFT ATRIUM          Size: MILDLY ENLARGED       LA Masses: No masses       IA Septum: Normal IAS  MAIN PA          Size: Normal  PULMONIC VALVE       Morphology: Normal            Mobility: Fully mobile  RIGHT VENTRICLE       RV Masses: No Masses             Size: Normal       Free Wall: Normal          Contraction: Normal  TRICUSPID VALVE        Leaflets: Normal            Mobility: Fully mobile       Morphology: Normal  RIGHT ATRIUM          Size: MILDLY ENLARGED        RA Other: None        RA Mass: No masses  PERICARDIUM         Fluid: No effusion  INFERIOR VENACAVA          Size: Normal Normal respiratory collapse   Patient Profile     79 y.o. female with history of insulin-dependent diabetes mellitus, hypertension, hyperlipidemia, cervical/thoracic/lumbar osteoarthritis, and obesity who is being seen  today for the evaluation of acute on chronic HFpEF at the request of Dr. Mortimer Fries.  Assessment & Plan    1. Acute on chronic HFpEF/pulmonary hypertension: -Initially admitted to the ICU and required BiPAP and Levophed -Now on nasal cannula at 4 L, wean as able -She remains volume overloaded the patient reported dry weight of approximately 206-208 pounds  -Weight trend this admission of 226--219 pounds -Likely secondary to obesity with likely OSA and OHS -Continue IV Lasix at 80 mg daily, probably for another 24-48 hours, until there are signs of intravascular volume depletion  -Monitor renal function closely -If renal function increases with continued dyspnea, may need to consider RHC based on renal function, UOP and symptoms -Pending renal function at that time, could consider LHC as well -Patient will need outpatient sleep study -CHF education -Daily weights -Strict ins and outs -Unable to place ReDs vest secondary to BMI  2.  Elevated troponin: -Never with symptoms of chest pain -Minimally elevated likely secondary to supply demand ischemia in  the setting of volume overload with mild AKI -No indication for heparin drip, not consistent with ACS -ASA -Consider outpatient functional study to assess for high risk ischemia vs LHC based on the above  3.  AKI: -Improved this morning -Monitor with diuresis  4.  HTN: -Has been soft, initially required Levophed which has been stopped -Improved this morning -Monitor with diuresis   5. NSVT: -Asymptomatic -Echo this admission with preserved LVSF as abvoe -Replete magnesium to goal 2.0, she has magnesium sulfate IV ordered for today -Potassium at goal -As respiratory status and BP allow, add beta blocker    For questions or updates, please contact [facility] Please consult [email or web address] for contact info under Cardiology/STEMI.    Signed, Christell Faith, PA-C [facility] Pager: [phone] 12/11/2019, 10:12 AM

## 2019-12-11 NOTE — Progress Notes (Signed)
PROGRESS NOTE    Tammie [name]   IRS:[id number]  DOB: [birth year]  PCP: Dion Body, MD    DOA: 12/08/2019 LOS: 3   Brief Narrative   79 y.o. female with history of diabetes mellitus, hypertension, hyperlipidemia, and morbid obesity, presented to the ED on 12/08/19 for 6 week history of progressive dysnpea, lower extremity edema and generalized malaise and fatigue, with 20 lb weight gain in 2 months time despite compliance with her diuretics.  She had been seen by PCP where she was noted to be hypoxic with O2 sat of 71%, and she was sent to the ED.  Initially was admitted to ICU requiring Bipap and Levophed for BP support.  Cardiology consulted.  Patient's hemodynamics and respiratory status improved and hospitalist service assumed care on 12/10/19.     Assessment & Plan   Principal Problem:   Acute on chronic heart failure with preserved ejection fraction (HFpEF) (HCC) Active Problems:   Acute respiratory failure with hypoxia (HCC)   Elevated troponin   Type 2 diabetes mellitus (HCC)   Essential hypertension   Heart failure with preserved ejection fraction (HCC)   Acute respiratory failure with hypoxia - POA, secondary to Acute on chronic HFpEF -presented with hypoxia, new bilateral pleural effusions, pulmonary edema and generalized anasarca.  Was initially admitted to ICU requiring BiPAP and Levophed.  Echo showed normal EF and grade 2 diastolic dysfunction.  Improving with IV Lasix, continue.  Cardiology is following.  Strict I/O's, daily weights.  Monitor renal function and electrolytes closely.  Needs cardiology follow up with [facility].  Net fluid balance -2.6 L to date this admission.  Type 2 diabetes -continue sliding scale NovoLog and CBGs.  Continue to hold her long-acting as sugars are in the 100s without it.  Essential hypertension -chronic, hypotensive on admission requiring Levophed.  Hypotension is improved but diastolic still in the 00X.  Continue  holding antihypertensives.  Hyperlipidemia -continue statin  Morbid obesity: Body mass index is 42.93 kg/m.  Complicates overall care and prognosis.  DVT prophylaxis: enoxaparin (LOVENOX) injection 40 mg Start: 12/08/19 2200 SCDs Start: 12/08/19 1401   Diet:  Diet Orders (From admission, onward)    Start     Ordered   12/09/19 1145  Diet heart healthy/carb modified Room service appropriate? Yes; Fluid consistency: Thin  Diet effective now       Question Answer Comment  Diet-HS Snack? Nothing   Room service appropriate? Yes   Fluid consistency: Thin      12/09/19 1144            Code Status: Full Code    Subjective 12/11/19    Patient seen at bedside this morning, son present.  She reports feeling fairly well.  Shortness of breath improving.  Leg swelling better but says her feet still quite swollen and feel tight.  Denies fevers or chills or chest pain.  No acute events reported.   Disposition Plan & Communication   Status is: Inpatient  Remains inpatient appropriate because:Inpatient level of care appropriate due to severity of illness.  Patient remains with hypoxic respiratory failure due to CHF and undergoing IV diuresis.   Dispo: The patient is from: Home              Anticipated d/c is to: Home              Anticipated d/c date is: 2-3 days              Patient currently  is not medically stable to d/c.   Family Communication: son present at bedside during encounter   Consults, Procedures, Significant Events   Consultants:   Cardiology  Procedures:   Echo EF 60 to 78%, grade 2 diastolic dysfunction    Objective   Vitals:   12/11/19 0426 12/11/19 0428 12/11/19 0747 12/11/19 0750  BP: (!) 130/59  129/61 129/61  Pulse: (!) 59  62 66  Resp: 16  16 16   Temp: 98 F (36.7 C)  98.2 F (36.8 C) 98.2 F (36.8 C)  TempSrc: Oral     SpO2: 93%  96% 96%  Weight:  99.7 kg    Height:        Intake/Output Summary (Last 24 hours) at 12/11/2019 0755 Last  data filed at 12/11/2019 0428 Gross per 24 hour  Intake 839.85 ml  Output 2475 ml  Net -1635.15 ml   Filed Weights   12/08/19 1013 12/11/19 0428  Weight: 100.2 kg 99.7 kg    Physical Exam:  General exam: awake, alert, no acute distress, obese HEENT: moist mucus membranes, hearing grossly normal Respiratory system: Bilateral crackles, diminished bases, no wheezes or rhonchi, normal respiratory effort, on 3 L/min nasal cannula oxygen. Cardiovascular system: normal S1/S2, RRR, 1+ lower extremity pitting edema, worse in feet. Central nervous system: A&O x4. no gross focal neurologic deficits, normal speech Extremities: moves all, no cyanosis, normal tone Psychiatry: normal mood, congruent affect, judgement and insight appear normal  Labs   Data Reviewed: I have personally reviewed following labs and imaging studies  CBC: Recent Labs  Lab 12/08/19 1036 12/09/19 0400 12/10/19 0431 12/11/19 0526  WBC 10.8* 8.6 11.1* 8.4  NEUTROABS  --   --  8.0* 6.0  HGB 12.0 12.7 12.0 11.4*  HCT 40.2 43.2 39.1 37.7  MCV 86.3 88.5 85.9 85.7  PLT 431* 418* 427* 242   Basic Metabolic Panel: Recent Labs  Lab 12/08/19 1036 12/09/19 0400 12/10/19 0431 12/11/19 0526  NA 133* 133* 139 140  K 4.4 4.8 4.0 4.1  CL 83* 85* 86* 90*  CO2 40* 37* 42* 40*  GLUCOSE 122* 152* 123* 148*  BUN 41* 36* 43* 34*  CREATININE 1.16* 1.03* 1.27* 1.02*  CALCIUM 9.2 8.9 8.9 8.9  MG  --   --  1.8 1.7  PHOS  --   --  4.9* 3.5   GFR: Estimated Creatinine Clearance: 47.4 mL/min (A) (by C-G formula based on SCr of 1.02 mg/dL (H)). Liver Function Tests: Recent Labs  Lab 12/08/19 1036  AST 16  ALT 13  ALKPHOS 50  BILITOT 0.6  PROT 6.5  ALBUMIN 3.4*   No results for input(s): LIPASE, AMYLASE in the last 168 hours. No results for input(s): AMMONIA in the last 168 hours. Coagulation Profile: No results for input(s): INR, PROTIME in the last 168 hours. Cardiac Enzymes: No results for input(s): CKTOTAL, CKMB,  CKMBINDEX, TROPONINI in the last 168 hours. BNP (last 3 results) No results for input(s): PROBNP in the last 8760 hours. HbA1C: No results for input(s): HGBA1C in the last 72 hours. CBG: Recent Labs  Lab 12/10/19 1646 12/10/19 2003 12/10/19 2345 12/11/19 0426 12/11/19 0748  GLUCAP 129* 177* 139* 134* 133*   Lipid Profile: No results for input(s): CHOL, HDL, LDLCALC, TRIG, CHOLHDL, LDLDIRECT in the last 72 hours. Thyroid Function Tests: No results for input(s): TSH, T4TOTAL, FREET4, T3FREE, THYROIDAB in the last 72 hours. Anemia Panel: No results for input(s): VITAMINB12, FOLATE, FERRITIN, TIBC, IRON, RETICCTPCT in the last  72 hours. Sepsis Labs: Recent Labs  Lab 12/08/19 1036 12/08/19 1125  PROCALCITON <0.10  --   LATICACIDVEN  --  1.9    Recent Results (from the past 240 hour(s))  SARS Coronavirus 2 by RT PCR (hospital order, performed in [facility] hospital lab) Nasopharyngeal Nasopharyngeal Swab     Status: None   Collection Time: 12/08/19 11:25 AM   Specimen: Nasopharyngeal Swab  Result Value Ref Range Status   SARS Coronavirus 2 NEGATIVE NEGATIVE Final    Comment: (NOTE) SARS-CoV-2 target nucleic acids are NOT DETECTED.  The SARS-CoV-2 RNA is generally detectable in upper and lower respiratory specimens during the acute phase of infection. The lowest concentration of SARS-CoV-2 viral copies this assay can detect is 250 copies / mL. A negative result does not preclude SARS-CoV-2 infection and should not be used as the sole basis for treatment or other patient management decisions.  A negative result may occur with improper specimen collection / handling, submission of specimen other than nasopharyngeal swab, presence of viral mutation(s) within the areas targeted by this assay, and inadequate number of viral copies (<250 copies / mL). A negative result must be combined with clinical observations, patient history, and epidemiological information.  Fact Sheet for  Patients:   StrictlyIdeas.no  Fact Sheet for Healthcare Providers: BankingDealers.co.za  This test is not yet approved or  cleared by the [place] FDA and has been authorized for detection and/or diagnosis of SARS-CoV-2 by FDA under an Emergency Use Authorization (EUA).  This EUA will remain in effect (meaning this test can be used) for the duration of the COVID-19 declaration under Section 564(b)(1) of the Act, 21 U.S.C. section 360bbb-3(b)(1), unless the authorization is terminated or revoked sooner.  Performed at [facility], [address], [place], [place] [address]   Urine culture     Status: None   Collection Time: 12/08/19 11:28 AM   Specimen: Urine, Random  Result Value Ref Range Status   Specimen Description   Final    URINE, RANDOM Performed at [facility], [address], [place], [place] [address]    Special Requests   Final    NONE Performed at [facility], [address], [place], [place] [address]    Culture   Final    NO GROWTH Performed at [facility], [place] [address], [place], [place] [address]    Report Status 12/09/2019 FINAL  Final  MRSA PCR Screening     Status: None   Collection Time: 12/08/19  4:37 PM   Specimen: Nasopharyngeal  Result Value Ref Range Status   MRSA by PCR NEGATIVE NEGATIVE Final    Comment:        The GeneXpert MRSA Assay (FDA approved for NASAL specimens only), is one component of a comprehensive MRSA colonization surveillance program. It is not intended to diagnose MRSA infection nor to guide or monitor treatment for MRSA infections. Performed at [facility], [address], [place], [place] [address]       Imaging Studies   CT ABDOMEN PELVIS WO CONTRAST  Result Date: 12/09/2019 CLINICAL DATA:  Abdominal pain. Concern for probable liver failure and renal failure associated with severe hypoglycemia. EXAM: CT ABDOMEN  AND PELVIS WITHOUT CONTRAST TECHNIQUE: Multidetector CT imaging of the abdomen and pelvis was performed following the standard protocol without IV contrast. COMPARISON:  None. FINDINGS: Lower chest: There are small to moderate bilateral pleural effusions. Consolidation/atelectasis of the left lower lobe is noted. Hepatobiliary:  No focal liver abnormality identified. Calcified stones are identified layering within the dependent portion of the gallbladder. No biliary ductal dilatation identified. Pancreas: Unremarkable. No pancreatic ductal dilatation or surrounding inflammatory changes. Spleen: Normal in size without focal abnormality. Adrenals/Urinary Tract: Normal appearance of the adrenal glands. Bilateral kidney cysts. Incompletely characterized without IV contrast. The largest is exophytic arising from the inferior pole of left kidney measuring 7.6 x 6.5 cm. No nephrolithiasis or hydronephrosis. The urinary bladder is normal. Stomach/Bowel: Stomach appears normal. There is no bowel wall thickening, inflammation or distension. Colonic diverticulosis noted. The appendix is visualized and appears normal. Vascular/Lymphatic: Aortic atherosclerosis. No aneurysm. No abdominopelvic adenopathy. Reproductive: Status post hysterectomy. No adnexal masses. Other: Trace ascites noted within the abdomen and pelvis. No focal fluid collections. Musculoskeletal: Skin thickening and subcutaneous fat stranding identified throughout the body wall compatible with anasarca. Mild degenerative disc disease within the thoracolumbar spine. Multi level, bilateral facet arthropathy identified within the lumbar spine. Mild bilateral hip osteoarthritis. IMPRESSION: 1. No acute findings identified within the abdomen or pelvis. 2. Bilateral kidney cysts. Incompletely characterized without IV contrast. 3. Gallstones. 4. Small to moderate bilateral pleural effusions with left lower lobe atelectasis/consolidation. 5. Anasarca. 6. Aortic  atherosclerosis. Aortic Atherosclerosis (ICD10-I70.0). Electronically Signed   By: Kerby Moors M.D.   On: 12/09/2019 09:31   DG Chest Port 1 View  Result Date: 12/11/2019 CLINICAL DATA:  Pleural effusion. EXAM: PORTABLE CHEST 1 VIEW COMPARISON:  CT 12/08/2019.  Chest x-ray 12/08/2019. FINDINGS: Mediastinum and hilar structures are normal. Cardiomegaly. Diffuse bilateral pulmonary infiltrates/edema noted on today's exam. Persistent bilateral pleural effusions. Findings most suggest CHF. Bilateral pneumonia cannot be excluded. No pneumothorax. IMPRESSION: Cardiomegaly. Diffuse bilateral pulmonary infiltrates/edema noted on today's exam. Persistent bilateral pleural effusions. Findings most consistent with CHF. Electronically Signed   By: [place]   On: 12/11/2019 04:52     Medications   Scheduled Meds: . aspirin EC  81 mg Oral Daily  . Chlorhexidine Gluconate Cloth  6 each Topical Daily  . dextromethorphan-guaiFENesin  1 tablet Oral BID  . enoxaparin (LOVENOX) injection  40 mg Subcutaneous BID  . furosemide  80 mg Intravenous Daily  . insulin aspart  0-24 Units Subcutaneous Q4H  . ipratropium-albuterol  3 mL Nebulization Once  . levothyroxine  75 mcg Oral Daily  . pantoprazole  40 mg Oral Daily  . potassium chloride  20 mEq Oral Daily  . pravastatin  20 mg Oral q1800  . sodium chloride flush  3 mL Intravenous Q12H   Continuous Infusions: . sodium chloride Stopped (12/09/19 1414)  . magnesium sulfate bolus IVPB         LOS: 3 days    Time spent: 30 minutes    Ezekiel Slocumb, DO [facility]  12/11/2019, 7:55 AM    If 7PM-7AM, please contact night-coverage. How to contact the [facility] Attending or Consulting provider [place] or covering provider during after hours [place], for this patient?    1. Check the care team in [facility] and look for a) attending/consulting TRH provider listed and b) the [facility] team listed 2. Log into [email or web address] and use [place]'s universal  password to access. If you do not have the password, please contact the hospital operator. 3. Locate the [facility] provider you are looking for under [facility] and page to a number that you can be directly reached. 4. If you still have difficulty reaching the provider, please page the [facility] (Director on Call) for the Hospitalists  listed on amion for assistance.

## 2019-12-11 NOTE — Care Management Important Message (Signed)
Important Message  Patient Details  Name: Tammie [name] MRN: [id number] Date of Birth: [birth year]   Medicare Important Message Given:  N/A - LOS <3 / Initial given by admissions     Juliann Pulse A Jagdeep Ancheta 12/11/2019, 8:43 AM

## 2019-12-11 NOTE — Evaluation (Signed)
Occupational Therapy Evaluation Patient Details Name: Tammie [name] MRN: [id number] DOB: June 09, 1941 Today's Date: 12/11/2019    History of Present Illness Tammie [name] is a 34yoF who [facility] 6[facility] c SpO2: 70s%. Pt reports 2w SOB. CT shows B effusion. Pt reports LEE and ABD distention recently.   Clinical Impression   Pt presents this afternoon agreeable to session, with SpO2 89-91% while lying in bed on 3L O2. RN present and increased oxygen to 3.5L in preparation for session. She currently lives alone, as her husband passed in 2019, but she has family who lives close by (2 sons, grandkids and great-grandkids) to assist PRN. Prior to hospitalization, pt was independent in all ADL/IADL without supplemental O2 at baseline, and she used an AD for longer community mobility. 2 falls in the past year. She loves playing with her grand and great-grandkids.   During session, pt completed functional transfer from EOB x2 using RW with Supervision. Her HR ranged from 78-108 with exertion in standing with no symptoms, RN notified. SpO2 remained >89% throughout on 3.5L. She ambulated to the sink with Supervision and stood for grooming task x5 min. Pt instructed in and demonstrated pursed lip breathing to maintain O2 sats. She had two occasions of unintentional urination in standing/sitting; therapist assisted with cleaning. Pt has difficulty accessing LB for dressing/bathing, requiring Mod A. She is grossly able to complete UB dressing/bathing with Min A. Pt educated on pursed lip breathing and self-monitoring of symptoms, energy conservation strategies and safe DME use for ADL. She will benefit from skilled acute OT services to address energy conservation within her routines, appropriate AE use for ADL and increased activity tolerance to maximize pt safety and independence. Recommend HHOT upon discharge.     Follow Up Recommendations  Home health OT    Equipment Recommendations  None  recommended by OT    Recommendations for Other Services       Precautions / Restrictions Precautions Precautions: Fall Restrictions Weight Bearing Restrictions: No Other Position/Activity Restrictions: monitor O2 throughout      Mobility Bed Mobility Overal bed mobility: Modified Independent             General bed mobility comments: requires bedrail and/or HHA to come to full sit  Transfers Overall transfer level: Needs assistance Equipment used: Rolling walker (2 wheeled) Transfers: Sit to/from Stand Sit to Stand: Supervision         General transfer comment: Supervision provided, pt demonstrated good control; HR ranged from 178-108 in standing without symptoms, RN notified    Balance Overall balance assessment: Modified Independent (steady dynamic sitting balance outside BOS, steady standing balance during functional activity with one upper extremity supported on RW)                                         ADL either performed or assessed with clinical judgement   ADL Overall ADL's : Needs assistance/impaired     Grooming: Standing;With adaptive equipment;Supervision/safety;Brushing hair Grooming Details (indicate cue type and reason): maintained standing at sink x5 min with SpO2 89-94% on 3.5L oxygen, used RW for support Upper Body Bathing: Sitting;Set up;Minimal assistance Upper Body Bathing Details (indicate cue type and reason): Min A for washing back     Upper Body Dressing : Minimal assistance;Sitting Upper Body Dressing Details (indicate cue type and reason): Min A for untying back of gown, pt able to  don gown without assist   Lower Body Dressing Details (indicate cue type and reason): has difficulty reaching to don/doff socks in sitting, Mod A provided     Toileting- Clothing Manipulation and Hygiene: Set up;Sit to/from stand;With adaptive equipment (using RW for balance)       Functional mobility during ADLs:  Supervision/safety;Rolling walker General ADL Comments: Pt completes ADL with SpO2 ranging 88-94% throughout, on 3.5L oxygen. She has difficulty reaching her feet, requiring Mod A for LB dressing/bathing.     Vision Baseline Vision/History: Wears glasses Wears Glasses: At all times       Perception     Praxis      Pertinent Vitals/Pain Pain Assessment: No/denies pain     Hand Dominance     Extremity/Trunk Assessment Upper Extremity Assessment Upper Extremity Assessment: Generalized weakness;Overall [facility] for tasks assessed   Lower Extremity Assessment Lower Extremity Assessment: Generalized weakness       Communication Communication Communication: No difficulties   Cognition Arousal/Alertness: Awake/alert Behavior During Therapy: WFL for tasks assessed/performed Overall Cognitive Status: Within Functional Limits for tasks assessed                                     General Comments       Exercises Other Exercises Other Exercises: Pt educated re: role of OT in acute care, pursed lip breathing, energy conservation strategies and safe DME use for ADL participation. Other Exercises: Verbal cues provided throughout for pursed lip breathing and self-monitoring of symptoms (i.e. dizziness, lightheadedness) Other Exercises: Facilitated grooming task while standing at sink x5 min for increased activity tolerance   Shoulder Instructions      Home Living Family/patient expects to be discharged to:: Private residence Living Arrangements: Alone Available Help at Discharge: Family (2 sons and grandchildren) Type of Home: House Home Access: Stairs to enter Technical brewer of Steps: 4 Entrance Stairs-Rails: Can reach both;Left;Right Home Layout: One level     Bathroom Shower/Tub: Occupational psychologist: Standard (has BSC)     Home Equipment: Environmental consultant - 2 wheels;Cane - single point;Bedside commode;Hand held shower head   Additional  Comments: Broken knee in 2015, since has intermittently unsteadiness will use device with distances outside of home. Has had 2 falls in the past year.      Prior Functioning/Environment Level of Independence: Independent        Comments: Pt independent in ADL/IADL, does use AD for longer community distance ambulation; has been retired for 18 years and enjoys playing with her grand and great grand kids; sits to shower at baseline        OT Problem List: Decreased strength;Decreased activity tolerance;Impaired balance (sitting and/or standing);Decreased knowledge of use of DME or AE;Cardiopulmonary status limiting activity;Increased edema      OT Treatment/Interventions: Self-care/ADL training;Therapeutic exercise;Therapeutic activities;Energy conservation;DME and/or AE instruction;Patient/family education;Balance training    OT Goals(Current goals can be found in the care plan section) Acute Rehab OT Goals Patient Stated Goal: To go home soon OT Goal Formulation: With patient Time For Goal Achievement: 12/25/19 Potential to Achieve Goals: Good ADL Goals Pt Will Perform Lower Body Dressing: with modified independence;with adaptive equipment;sit to/from stand (using LRAD and AE as needed) Additional ADL Goal #1: Pt will independently incorporate learned pursed lip breathing during ADL to maintain SpO2 >90% to promote pt safety Additional ADL Goal #2: Pt will independently incorporate >3 learned energy conservation strategies during  functional ADL to promote pt safety and independence  OT Frequency: Min 2X/week   Barriers to D/C:            Co-evaluation              AM-PAC OT "6 Clicks" Daily Activity     Outcome Measure Help from another person eating meals?: None Help from another person taking care of personal grooming?: None Help from another person toileting, which includes using toliet, bedpan, or urinal?: None Help from another person bathing (including washing,  rinsing, drying)?: A Lot Help from another person to put on and taking off regular upper body clothing?: A Little Help from another person to put on and taking off regular lower body clothing?: A Lot 6 Click Score: 19   End of Session Equipment Utilized During Treatment: Gait belt;Rolling walker Nurse Communication: Mobility status;Other (comment) (HR range: 78-108)  Activity Tolerance: Patient tolerated treatment well Patient left: in bed;with call bell/phone within reach;with bed alarm set  OT Visit Diagnosis: History of falling (Z91.81);Muscle weakness (generalized) (M62.81);Unsteadiness on feet (R26.81)                Time: 7972-8206 OT Time Calculation (min): 69 min Charges:  OT General Charges $OT Visit: 1 Visit OT Evaluation $OT Eval Moderate Complexity: 1 Mod OT Treatments $Self Care/Home Management : 53-67 mins  Jerilynn Birkenhead, OTS 12/11/19, 3:33 PM

## 2019-12-11 NOTE — Evaluation (Signed)
Physical Therapy Evaluation Patient Details Name: Tammie [name] MRN: [id number] DOB: 1940-12-16 Today's Date: 12/11/2019   History of Present Illness  Tammie [name] is a 67yoF who comes to [facility] on 6/28 c SpO2: 70s%. Pt reports 2w SOB. CT shows B effusion. Pt reports LEE and ABD distention recently.  Clinical Impression  Pt admitted with above diagnosis. Pt currently with functional limitations due to the deficits listed below (see "PT Problem List"). Upon entry, pt in bed, awake and agreeable to participate. The pt is alert and oriented x4, pleasant, conversational, and generally a good historian. Pt able to move to EOB and rise to standing without assist, but effort is somewhat labored. Pt AMB some without RW, but is clearly unsteady on feet, reports to feel slightly more unsteady than her norm. Pt AMB on 4L/min as received, desats to 85% in less than 44ft AMB. Functional mobility assessment demonstrates increased effort/time requirements, poor tolerance, and need for physical assistance, whereas the patient performed these at a higher level of independence PTA. Pt will benefit from skilled PT intervention to increase independence and safety with basic mobility in preparation for discharge to the venue listed below.       Follow Up Recommendations Home health PT;Supervision for mobility/OOB    Equipment Recommendations  None recommended by PT    Recommendations for Other Services       Precautions / Restrictions Precautions Precautions: Fall Restrictions Weight Bearing Restrictions: No      Mobility  Bed Mobility Overal bed mobility: Modified Independent                Transfers Overall transfer level: Modified independent Equipment used: None                Ambulation/Gait Ambulation/Gait assistance: Min guard Gait Distance (Feet): 48 Feet Assistive device: IV Pole Gait Pattern/deviations: Step-to pattern     General Gait Details: somewhat  unsteady without LOB; on 4L/min, desats to 85% while walking  Stairs            Wheelchair Mobility    Modified Rankin (Stroke Patients Only)       Balance Overall balance assessment: Modified Independent (reports to feel more unsteady than baseline)                                           Pertinent Vitals/Pain Pain Assessment: No/denies pain (tightness across the anterior diaphragm attchment)    Home Living Family/patient expects to be discharged to:: Private residence Living Arrangements: Alone Available Help at Discharge: Family (2 sons and grandchildren) Type of Home: House Home Access: Stairs to enter Entrance Stairs-Rails: Can reach both;Left;Right Entrance Stairs-Number of Steps: 4 Home Layout: One level Home Equipment: Environmental consultant - 2 wheels;Cane - single point Additional Comments: Broken knee in 2015, since has intermittently unsteadiness will use device with distances outside of home.    Prior Function Level of Independence: Independent         Comments: At baseline has difficulty with longer community distances, but could tolerate short limited community distances.     Hand Dominance        Extremity/Trunk Assessment   Upper Extremity Assessment Upper Extremity Assessment: Generalized weakness    Lower Extremity Assessment Lower Extremity Assessment: Generalized weakness       Communication      Cognition Arousal/Alertness: Awake/alert Behavior During Therapy: WFL for tasks  assessed/performed Overall Cognitive Status: Within Functional Limits for tasks assessed                                        General Comments      Exercises     Assessment/Plan    PT Assessment Patient needs continued PT services  PT Problem List Decreased strength;Decreased range of motion;Decreased activity tolerance;Cardiopulmonary status limiting activity;Decreased balance;Decreased knowledge of use of DME;Decreased  knowledge of precautions;Decreased safety awareness;Decreased mobility       PT Treatment Interventions DME instruction;Balance training;Gait training;Stair training;Functional mobility training;Therapeutic activities;Therapeutic exercise;Patient/family education    PT Goals (Current goals can be found in the Care Plan section)  Acute Rehab PT Goals Patient Stated Goal: Return to baseline breathing, reduce pain in chest PT Goal Formulation: With patient Time For Goal Achievement: 12/25/19 Potential to Achieve Goals: Good    Frequency Min 2X/week   Barriers to discharge Inaccessible home environment      Co-evaluation               AM-PAC PT "6 Clicks" Mobility  Outcome Measure Help needed turning from your back to your side while in a flat bed without using bedrails?: A Little Help needed moving from lying on your back to sitting on the side of a flat bed without using bedrails?: A Little Help needed moving to and from a bed to a chair (including a wheelchair)?: A Little Help needed standing up from a chair using your arms (e.g., wheelchair or bedside chair)?: A Little Help needed to walk in hospital room?: A Little Help needed climbing 3-5 steps with a railing? : A Lot 6 Click Score: 17    End of Session Equipment Utilized During Treatment: Gait belt Activity Tolerance: Patient tolerated treatment well;Patient limited by fatigue Patient left: in bed;with family/visitor present;with call bell/phone within reach Nurse Communication: Mobility status PT Visit Diagnosis: Unsteadiness on feet (R26.81);Difficulty in walking, not elsewhere classified (R26.2)    Time: 7841-2820 PT Time Calculation (min) (ACUTE ONLY): 23 min   Charges:   PT Evaluation $PT Eval Low Complexity: 1 Low          12:02 PM, 12/11/19 Etta Grandchild, PT, DPT Physical Therapist - [facility]  [phone] ([place])    [place] C 12/11/2019, 12:01 PM

## 2019-12-12 DIAGNOSIS — J90 Pleural effusion, not elsewhere classified: Secondary | ICD-10-CM

## 2019-12-12 LAB — GLUCOSE, CAPILLARY
Glucose-Capillary: 143 mg/dL — ABNORMAL HIGH (ref 70–99)
Glucose-Capillary: 130 mg/dL — ABNORMAL HIGH (ref 70–99)
Glucose-Capillary: 121 mg/dL — ABNORMAL HIGH (ref 70–99)
Glucose-Capillary: 128 mg/dL — ABNORMAL HIGH (ref 70–99)
Glucose-Capillary: 114 mg/dL — ABNORMAL HIGH (ref 70–99)
Glucose-Capillary: 218 mg/dL — ABNORMAL HIGH (ref 70–99)
Glucose-Capillary: 145 mg/dL — ABNORMAL HIGH (ref 70–99)

## 2019-12-12 LAB — BASIC METABOLIC PANEL
Anion gap: 10 (ref 5–15)
BUN: 29 mg/dL — ABNORMAL HIGH (ref 8–23)
CO2: 42 mmol/L — ABNORMAL HIGH (ref 22–32)
Calcium: 9 mg/dL (ref 8.9–10.3)
Chloride: 88 mmol/L — ABNORMAL LOW (ref 98–111)
Creatinine, Ser: 1.06 mg/dL — ABNORMAL HIGH (ref 0.44–1.00)
GFR calc Af Amer: 58 mL/min — ABNORMAL LOW (ref >60)
GFR calc non Af Amer: 50 mL/min — ABNORMAL LOW (ref >60)
Glucose, Bld: 127 mg/dL — ABNORMAL HIGH (ref 70–99)
Potassium: 4 mmol/L (ref 3.5–5.1)
Sodium: 140 mmol/L (ref 135–145)

## 2019-12-12 LAB — MAGNESIUM: Magnesium: 1.8 mg/dL (ref 1.7–2.4)

## 2019-12-12 MED ORDER — FUROSEMIDE 10 MG/ML IJ SOLN
60.0000 mg | Freq: Two times a day (BID) | INTRAMUSCULAR | Status: DC
  Administered 2019-12-12 – 2019-12-14 (×5): 60 mg via INTRAVENOUS
  Filled 2019-12-12 (×5): qty 6

## 2019-12-12 MED ORDER — SALINE SPRAY 0.65 % NA SOLN
1.0000 | NASAL | Status: DC | PRN
  Filled 2019-12-12: qty 44

## 2019-12-12 NOTE — Progress Notes (Signed)
PROGRESS NOTE    Tammie [name]   ACZ:[id number]  DOB: Jun 06, 1941  PCP: Dion Body, MD    DOA: 12/08/2019 LOS: 4   Brief Narrative   79 y.o. female with history of diabetes mellitus, hypertension, hyperlipidemia, and morbid obesity, presented to the ED on 12/08/19 for 6 week history of progressive dysnpea, lower extremity edema and generalized malaise and fatigue, with 20 lb weight gain in 2 months time despite compliance with her diuretics.  She had been seen by PCP where she was noted to be hypoxic with O2 sat of 71%, and she was sent to the ED.  Initially was admitted to ICU requiring Bipap and Levophed for BP support.  Cardiology consulted.  Patient's hemodynamics and respiratory status improved and hospitalist service assumed care on 12/10/19.     Assessment & Plan   Principal Problem:   Acute on chronic heart failure with preserved ejection fraction (HFpEF) (HCC) Active Problems:   Acute respiratory failure with hypoxia (HCC)   Elevated troponin   Type 2 diabetes mellitus (HCC)   Essential hypertension   Heart failure with preserved ejection fraction (HCC)   Acute respiratory failure with hypoxia - POA, secondary to Acute on chronic HFpEF -presented with hypoxia, new bilateral pleural effusions, pulmonary edema and generalized anasarca.  Was initially admitted to ICU requiring BiPAP and Levophed.  Echo showed normal EF and grade 2 diastolic dysfunction.  Improving with IV Lasix, continue.  Cardiology is following.  Strict I/O's, daily weights.  Monitor renal function and electrolytes closely.  Needs cardiology follow up with [facility].  Net fluid balance -4.8 L to date this admission.  Type 2 diabetes -continue sliding scale NovoLog and CBGs.  Continue to hold her long-acting as sugars are in the 100s without it.  Essential hypertension -chronic, hypotensive on admission requiring Levophed.  Hypotension is improved but diastolic still in the 10X.  Continue  holding antihypertensives.  Hyperlipidemia -continue statin  Morbid obesity: Body mass index is 42.93 kg/m.  Complicates overall care and prognosis.  DVT prophylaxis: enoxaparin (LOVENOX) injection 40 mg Start: 12/08/19 2200 SCDs Start: 12/08/19 1401   Diet:  Diet Orders (From admission, onward)    Start     Ordered   12/09/19 1145  Diet heart healthy/carb modified Room service appropriate? Yes; Fluid consistency: Thin  Diet effective now       Question Answer Comment  Diet-HS Snack? Nothing   Room service appropriate? Yes   Fluid consistency: Thin      12/09/19 1144            Code Status: Full Code    Subjective 12/12/19    Patient seen at bedside this morning, with Dr. Rockey Situ.  She reports very dry nose and some congestion, along with bridge of her nose irritated from bipap mask.  Otherwise says breathing is better.  Feet still feel tight but legs better.  Still feels abdomen is distended.     Disposition Plan & Communication   Status is: Inpatient  Remains inpatient appropriate because:Inpatient level of care appropriate due to severity of illness.  Patient remains with hypoxic respiratory failure due to CHF and undergoing IV diuresis.   Dispo: The patient is from: Home              Anticipated d/c is to: Home              Anticipated d/c date is: 2-3 days  Patient currently is not medically stable to d/c.   Family Communication: none at bedside during encounter, will attempt to call   Consults, Procedures, Significant Events   Consultants:   Cardiology  Procedures:   Echo EF 60 to 96%, grade 2 diastolic dysfunction    Objective   Vitals:   12/11/19 2246 12/11/19 2248 12/12/19 0057 12/12/19 0445  BP:   (!) 120/49 122/61  Pulse:   63 63  Resp:    18  Temp:   97.9 F (36.6 C) 98.3 F (36.8 C)  TempSrc:   Oral Oral  SpO2: 95% 96% 93% (!) 88%  Weight:      Height:        Intake/Output Summary (Last 24 hours) at 12/12/2019  0730 Last data filed at 12/12/2019 0505 Gross per 24 hour  Intake 0 ml  Output 2200 ml  Net -2200 ml   Filed Weights   12/08/19 1013 12/11/19 0428  Weight: 100.2 kg 99.7 kg    Physical Exam:  General exam: awake, alert, no acute distress, obese HEENT: moist mucus membranes, hearing grossly normal Respiratory system: overall clear with diminished bases, no wheezes or rhonchi, normal respiratory effort, on 3 L/min nasal cannula oxygen. Cardiovascular system: normal S1/S2, RRR, BLE edema down to trace but feet still quite swollen. Central nervous system: A&O x4. no gross focal neurologic deficits, normal speech Extremities: moves all, no cyanosis, normal tone Psychiatry: normal mood, congruent affect, judgement and insight appear normal  Labs   Data Reviewed: I have personally reviewed following labs and imaging studies  CBC: Recent Labs  Lab 12/08/19 1036 12/09/19 0400 12/10/19 0431 12/11/19 0526  WBC 10.8* 8.6 11.1* 8.4  NEUTROABS  --   --  8.0* 6.0  HGB 12.0 12.7 12.0 11.4*  HCT 40.2 43.2 39.1 37.7  MCV 86.3 88.5 85.9 85.7  PLT 431* 418* 427* 789   Basic Metabolic Panel: Recent Labs  Lab 12/08/19 1036 12/09/19 0400 12/10/19 0431 12/11/19 0526 12/12/19 0552  NA 133* 133* 139 140 140  K 4.4 4.8 4.0 4.1 4.0  CL 83* 85* 86* 90* 88*  CO2 40* 37* 42* 40* 42*  GLUCOSE 122* 152* 123* 148* 127*  BUN 41* 36* 43* 34* 29*  CREATININE 1.16* 1.03* 1.27* 1.02* 1.06*  CALCIUM 9.2 8.9 8.9 8.9 9.0  MG  --   --  1.8 1.7 1.8  PHOS  --   --  4.9* 3.5  --    GFR: Estimated Creatinine Clearance: 45.7 mL/min (A) (by C-G formula based on SCr of 1.06 mg/dL (H)). Liver Function Tests: Recent Labs  Lab 12/08/19 1036  AST 16  ALT 13  ALKPHOS 50  BILITOT 0.6  PROT 6.5  ALBUMIN 3.4*   No results for input(s): LIPASE, AMYLASE in the last 168 hours. No results for input(s): AMMONIA in the last 168 hours. Coagulation Profile: No results for input(s): INR, PROTIME in the last 168  hours. Cardiac Enzymes: No results for input(s): CKTOTAL, CKMB, CKMBINDEX, TROPONINI in the last 168 hours. BNP (last 3 results) No results for input(s): PROBNP in the last 8760 hours. HbA1C: No results for input(s): HGBA1C in the last 72 hours. CBG: Recent Labs  Lab 12/11/19 1137 12/11/19 1634 12/11/19 2011 12/12/19 0130 12/12/19 0446  GLUCAP 168* 126* 187* 143* 130*   Lipid Profile: No results for input(s): CHOL, HDL, LDLCALC, TRIG, CHOLHDL, LDLDIRECT in the last 72 hours. Thyroid Function Tests: No results for input(s): TSH, T4TOTAL, FREET4, T3FREE, THYROIDAB in the  last 72 hours. Anemia Panel: No results for input(s): VITAMINB12, FOLATE, FERRITIN, TIBC, IRON, RETICCTPCT in the last 72 hours. Sepsis Labs: Recent Labs  Lab 12/08/19 1036 12/08/19 1125  PROCALCITON <0.10  --   LATICACIDVEN  --  1.9    Recent Results (from the past 240 hour(s))  SARS Coronavirus 2 by RT PCR (hospital order, performed in [facility] hospital lab) Nasopharyngeal Nasopharyngeal Swab     Status: None   Collection Time: 12/08/19 11:25 AM   Specimen: Nasopharyngeal Swab  Result Value Ref Range Status   SARS Coronavirus 2 NEGATIVE NEGATIVE Final    Comment: (NOTE) SARS-CoV-2 target nucleic acids are NOT DETECTED.  The SARS-CoV-2 RNA is generally detectable in upper and lower respiratory specimens during the acute phase of infection. The lowest concentration of SARS-CoV-2 viral copies this assay can detect is 250 copies / mL. A negative result does not preclude SARS-CoV-2 infection and should not be used as the sole basis for treatment or other patient management decisions.  A negative result may occur with improper specimen collection / handling, submission of specimen other than nasopharyngeal swab, presence of viral mutation(s) within the areas targeted by this assay, and inadequate number of viral copies (<250 copies / mL). A negative result must be combined with clinical observations,  patient history, and epidemiological information.  Fact Sheet for Patients:   StrictlyIdeas.no  Fact Sheet for Healthcare Providers: BankingDealers.co.za  This test is not yet approved or  cleared by the [place] FDA and has been authorized for detection and/or diagnosis of SARS-CoV-2 by FDA under an Emergency Use Authorization (EUA).  This EUA will remain in effect (meaning this test can be used) for the duration of the COVID-19 declaration under Section 564(b)(1) of the Act, 21 U.S.C. section 360bbb-3(b)(1), unless the authorization is terminated or revoked sooner.  Performed at [facility], [address] [place], [place] [address]   Urine culture     Status: None   Collection Time: 12/08/19 11:28 AM   Specimen: Urine, Random  Result Value Ref Range Status   Specimen Description   Final    URINE, RANDOM Performed at [facility], [address], [place], [place] [address]    Special Requests   Final    NONE Performed at [facility], [address] [place], [place] [address]    Culture   Final    NO GROWTH Performed at [facility], [place] [address], [place], [place] [address]    Report Status 12/09/2019 FINAL  Final  MRSA PCR Screening     Status: None   Collection Time: 12/08/19  4:37 PM   Specimen: Nasopharyngeal  Result Value Ref Range Status   MRSA by PCR NEGATIVE NEGATIVE Final    Comment:        The GeneXpert MRSA Assay (FDA approved for NASAL specimens only), is one component of a comprehensive MRSA colonization surveillance program. It is not intended to diagnose MRSA infection nor to guide or monitor treatment for MRSA infections. Performed at [facility], [address] [place], [place] [address]       Imaging Studies   DG Chest [place] 1 View  Result Date: 12/11/2019 CLINICAL DATA:  Pleural effusion. EXAM: PORTABLE CHEST 1 VIEW COMPARISON:  CT  12/08/2019.  Chest x-ray 12/08/2019. FINDINGS: Mediastinum and hilar structures are normal. Cardiomegaly. Diffuse bilateral pulmonary infiltrates/edema noted on today's exam. Persistent bilateral pleural effusions. Findings most suggest CHF. Bilateral pneumonia cannot be excluded.  No pneumothorax. IMPRESSION: Cardiomegaly. Diffuse bilateral pulmonary infiltrates/edema noted on today's exam. Persistent bilateral pleural effusions. Findings most consistent with CHF. Electronically Signed   By: Johnson   On: 12/11/2019 04:52     Medications   Scheduled Meds: . aspirin EC  81 mg Oral Daily  . Chlorhexidine Gluconate Cloth  6 each Topical Daily  . dextromethorphan-guaiFENesin  1 tablet Oral BID  . enoxaparin (LOVENOX) injection  40 mg Subcutaneous BID  . furosemide  80 mg Intravenous Daily  . insulin aspart  0-24 Units Subcutaneous Q4H  . ipratropium-albuterol  3 mL Nebulization Once  . levothyroxine  75 mcg Oral Daily  . pantoprazole  40 mg Oral Daily  . potassium chloride  20 mEq Oral Daily  . pravastatin  20 mg Oral q1800  . sodium chloride flush  3 mL Intravenous Q12H   Continuous Infusions: . sodium chloride Stopped (12/09/19 1414)       LOS: 4 days    Time spent: 25 minutes    Ezekiel Slocumb, DO [facility]  12/12/2019, 7:30 AM    If 7PM-7AM, please contact night-coverage. How to contact the [facility] Attending or Consulting provider [place] or covering provider during after hours [place], for this patient?    1. Check the care team in [facility] and look for a) attending/consulting TRH provider listed and b) the [facility] team listed 2. Log into [email or web address] and use [place]'s universal password to access. If you do not have the password, please contact the hospital operator. 3. Locate the [facility] provider you are looking for under [facility] and page to a number that you can be directly reached. 4. If you still have difficulty reaching the provider, please page the  [facility] (Director on Call) for the Hospitalists listed on amion for assistance.

## 2019-12-12 NOTE — Progress Notes (Signed)
Physical Therapy Treatment Patient Details Name: Tammie [name] MRN: [id number] DOB: Nov 06, 1940 Today's Date: 12/12/2019    History of Present Illness Tammie [name] is a 11yoF who comes to [facility] on 6/28 c SpO2: 70s%. Pt reports 2w SOB. CT shows B effusion. Pt reports LEE and ABD distention recently.    PT Comments    Pt in bed upon entry, soiled with urine. Heavy effort and supervision to move to EOB, pt assisted with clean gown. Pt able to rise to standing x4 in session with RW, intermittent hand use, no LOB. Pt AMB 2 bouts ~63ft on 3L/min flowrate as received, desaturation takes longer dropping eventually to 87% before author initiates a recovery interval. Pt asked to have some time in BR to maximize greatest potential for movement of bowel successes. Author is agreeable, pt oriented to callbell in BR, NA and RN made aware of pt need.     Follow Up Recommendations  Home health PT;Supervision for mobility/OOB     Equipment Recommendations  None recommended by PT    Recommendations for Other Services       Precautions / Restrictions Precautions Precautions: Fall Restrictions Other Position/Activity Restrictions: monitor O2 throughout    Mobility  Bed Mobility Overal bed mobility: Modified Independent             General bed mobility comments: labored effort, HOB elevated  Transfers Overall transfer level: Needs assistance Equipment used: Rolling walker (2 wheeled) Transfers: Sit to/from Stand Sit to Stand: Supervision            Ambulation/Gait Ambulation/Gait assistance: Min guard Gait Distance (Feet): 80 Feet Assistive device: Rolling walker (2 wheeled) Gait Pattern/deviations: Step-to pattern     General Gait Details: alternating FWD adn retroAMB; no gross LOB, slow and cautious. Desat to 87% on 3L.   Stairs             Wheelchair Mobility    Modified Rankin (Stroke Patients Only)       Balance Overall balance assessment:  Modified Independent                                          Cognition Arousal/Alertness: Awake/alert Behavior During Therapy: WFL for tasks assessed/performed Overall Cognitive Status: Within Functional Limits for tasks assessed                                        Exercises      General Comments        Pertinent Vitals/Pain Pain Assessment: No/denies pain    Home Living                      Prior Function            PT Goals (current goals can now be found in the care plan section) Acute Rehab PT Goals Patient Stated Goal: To go home soon PT Goal Formulation: With patient Time For Goal Achievement: 12/25/19 Potential to Achieve Goals: Good Progress towards PT goals: Progressing toward goals    Frequency    Min 2X/week      PT Plan Current plan remains appropriate    Co-evaluation              AM-PAC PT "6 Clicks" Mobility   Outcome Measure  Help needed turning from your back to your side while in a flat bed without using bedrails?: A Little Help needed moving from lying on your back to sitting on the side of a flat bed without using bedrails?: A Little Help needed moving to and from a bed to a chair (including a wheelchair)?: A Little Help needed standing up from a chair using your arms (e.g., wheelchair or bedside chair)?: A Little Help needed to walk in hospital room?: A Little Help needed climbing 3-5 steps with a railing? : A Lot 6 Click Score: 17    End of Session Equipment Utilized During Treatment: Gait belt Activity Tolerance: Patient tolerated treatment well;Patient limited by fatigue Patient left: with call bell/phone within reach;Other (comment) (toilet) Nurse Communication: Mobility status PT Visit Diagnosis: Unsteadiness on feet (R26.81);Difficulty in walking, not elsewhere classified (R26.2)     Time: 7473-4037 PT Time Calculation (min) (ACUTE ONLY): 25 min  Charges:  $Therapeutic  Exercise: 23-37 mins                     12:32 PM, 12/12/19 Etta Grandchild, PT, DPT Physical Therapist - [facility]  [phone] ([place])     [place] C 12/12/2019, 12:30 PM

## 2019-12-12 NOTE — TOC Progression Note (Signed)
Transition of Care [facility]) - Progression Note    Patient Details  Name: Tammie [name] MRN: [id number] Date of Birth: 02/27/1941  [facility] CM/SW Contact  Shelbie Hutching, RN Phone Number: 12/12/2019, 2:17 PM  Clinical Narrative:    Patient will be discharging to her son's home at [place] [address].   Expected Discharge Plan: [place] Barriers to Discharge: Continued Medical Work up  Expected Discharge Plan and Services Expected Discharge Plan: [place]   Discharge Planning Services: CM Consult Post Acute Care Choice: [place] arrangements for the past 2 months: [place]: PT, OT [place] Agency: [place] ([place]) Date [place]: 12/12/19 Time [place]: [place] Representative spoke with at [place]: [place] (SDOH) Interventions    Readmission Risk Interventions No flowsheet data found.

## 2019-12-12 NOTE — Care Management Important Message (Signed)
Important Message  Patient Details  Name: Tammie [name] MRN: [id number] Date of Birth: [birth year]   Medicare Important Message Given:  Yes     [name] 12/12/2019, 10:42 AM

## 2019-12-12 NOTE — Plan of Care (Signed)

## 2019-12-12 NOTE — TOC Initial Note (Addendum)
Transition of Care [facility]) - Initial/Assessment Note    Patient Details  Name: Tammie [name] MRN: [id number] Date of Birth: 1941/03/13  Transition of Care [facility] CM/SW Contact:    Shelbie Hutching, RN Phone Number: 12/12/2019, 12:42 PM  Clinical Narrative:                 Patient admitted to the hospital for acute on chronic heart failure. Patient reports that she does not have a history of any heart or respiratory problems.  RNCM introduced self and explained role.  Patient is from home where she lives alone, her husband died in 30-Sep-2017.  Patient has 2 sons, Roselyn Reef and [place] who both live in the area and check on her every day.  Patient is independent at home, she walks with a cane when she goes out, she also has a bedside commode and walker.   Patient is on acute O2 at 4L, she does not wear oxygen at home.  When she is medically stable to go home she will be going to stay with her son Leroy Sea.  Patient agrees to home health and Floydene Flock with [facility] has accepted the referral for PT and OT.   TOC team will cont to follow through discharge.   Expected Discharge Plan: [place] Barriers to Discharge: Continued Medical Work up   Patient Goals and CMS Choice Patient states their goals for this hospitalization and ongoing recovery are:: To get well CMS Medicare.gov Compare Post Acute Care list provided to:: Patient Choice offered to / list presented to : Patient  Expected Discharge Plan and Services Expected Discharge Plan: [place]   Discharge Planning Services: CM Consult Post Acute Care Choice: [place] arrangements for the past 2 months: [place] Arranged: PT, OT HH Agency: [facility] Date [place]: 12/12/19 Time [place]: [id number] Representative spoke with at [place]: Jana Half  Prior Living Arrangements/Services Living arrangements for the past 2 months:  [place] with:: Self Patient language and need for interpreter reviewed:: Yes Do you feel safe going back to the place where you live?: Yes      Need for Family Participation in Patient Care: Yes (Comment) (heart failure) Care giver support system in place?: Yes (comment) (sons) Current home services: DME (walker, cane and 3 in 1) Criminal Activity/Legal Involvement Pertinent to Current Situation/Hospitalization: No - Comment as needed  Activities of Daily Living Home Assistive Devices/Equipment: Grab bars in shower, Bedside commode/3-in-1, Shower chair with back, Cane (specify quad or straight) (1 prong cane) ADL Screening (condition at time of admission) Patient's cognitive ability adequate to safely complete daily activities?: Yes Is the patient deaf or have difficulty hearing?: No Does the patient have difficulty seeing, even when wearing glasses/contacts?: No Does the patient have difficulty concentrating, remembering, or making decisions?: No Patient able to express need for assistance with ADLs?: Yes Does the patient have difficulty dressing or bathing?: No Independently performs ADLs?: Yes (appropriate for developmental age) Does the patient have difficulty walking or climbing stairs?: No Weakness of Legs: None Weakness of Arms/Hands: None  Permission Sought/Granted Permission sought to share information with : Case Manager, Family Supports, Other (comment) Permission granted to share information with : Yes, Verbal Permission Granted  Share Information with NAME: Leroy Sea  and Roselyn Reef  Permission granted to share info w AGENCY: [facility]  Permission granted to share info w Relationship: sons     Emotional Assessment Appearance:: Appears stated age Attitude/Demeanor/Rapport: Engaged Affect (typically observed): Accepting Orientation: : Oriented to Self, Oriented to Place, Oriented to  Time, Oriented to Situation Alcohol / Substance Use: Not Applicable Psych  Involvement: No (comment)  Admission diagnosis:  Respiratory failure (HCC) [J96.90] Acute respiratory failure with hypoxia and hypercapnia (HCC) [J96.01, J96.02] Patient Active Problem List   Diagnosis Date Noted  . Acute on chronic heart failure with preserved ejection fraction (HFpEF) ([place]) 12/10/2019  . Elevated troponin 12/10/2019  . Type 2 diabetes mellitus ([place]) 12/10/2019  . Essential hypertension 12/10/2019  . Heart failure with preserved ejection fraction ([place])   . Acute respiratory failure with hypoxia ([place]) 12/08/2019   PCP:  Dion Body, MD Pharmacy:   [id number] - [place], [place] - [address] [place] [address] Phone: [phone] Fax: [phone]     Social Determinants of Health (SDOH) Interventions    Readmission Risk Interventions No flowsheet data found.

## 2019-12-12 NOTE — Progress Notes (Signed)
Progress Note  Patient Name: Tammie [name] Date of Encounter: 12/12/2019  Primary Cardiologist: New to [facility] - consult by End  Subjective   Transferred out of the ICU to the floor.  Treated with lasix IV daily, >4 liters negative neg Slow improvement in leg swelling, ABD distention, SOB improving Still with some abdominal fullness, shortness of breath  Inpatient Medications    Scheduled Meds: . aspirin EC  81 mg Oral Daily  . Chlorhexidine Gluconate Cloth  6 each Topical Daily  . dextromethorphan-guaiFENesin  1 tablet Oral BID  . enoxaparin (LOVENOX) injection  40 mg Subcutaneous BID  . furosemide  80 mg Intravenous Daily  . insulin aspart  0-24 Units Subcutaneous Q4H  . ipratropium-albuterol  3 mL Nebulization Once  . levothyroxine  75 mcg Oral Daily  . pantoprazole  40 mg Oral Daily  . potassium chloride  20 mEq Oral Daily  . pravastatin  20 mg Oral q1800  . sodium chloride flush  3 mL Intravenous Q12H   Continuous Infusions: . sodium chloride Stopped (12/09/19 1414)   PRN Meds: sodium chloride, acetaminophen, dextrose, docusate sodium, ipratropium-albuterol, ondansetron (ZOFRAN) IV, polyethylene glycol, sodium chloride flush   Vital Signs    Vitals:   12/11/19 2248 12/12/19 0057 12/12/19 0445 12/12/19 0803  BP:  (!) 120/49 122/61 134/61  Pulse:  63 63 67  Resp:   18 17  Temp:  97.9 F (36.6 C) 98.3 F (36.8 C) (!) 97.5 F (36.4 C)  TempSrc:  Oral Oral Oral  SpO2: 96% 93% (!) 88% 92%  Weight:      Height:        Intake/Output Summary (Last 24 hours) at 12/12/2019 1129 Last data filed at 12/12/2019 1052 Gross per 24 hour  Intake 536 ml  Output 1700 ml  Net -1164 ml   Filed Weights   12/08/19 1013 12/11/19 0428  Weight: 100.2 kg 99.7 kg    Telemetry    SR with PACS, PVCs- Personally Reviewed  ECG    No new tracings - Personally Reviewed  Physical Exam   Constitutional:  oriented to person, place, and time. No distress.  HENT:  Head:  Grossly normal Eyes:  no discharge. No scleral icterus.  Neck: Unable to estimate JVD, no carotid bruits  Cardiovascular: Regular rate and rhythm, no murmurs appreciated Trace pitting in the feet Pulmonary/Chest: Clear to auscultation bilaterally, no wheezes  Scattered Rales at the bases Abdominal: Soft.  no distension.  no tenderness.  Musculoskeletal: Normal range of motion Neurological:  normal muscle tone. Coordination normal. No atrophy Skin: Skin warm and dry Psychiatric: normal affect, pleasant   Labs    Chemistry Recent Labs  Lab 12/08/19 1036 12/09/19 0400 12/10/19 0431 12/11/19 0526 12/12/19 0552  NA 133*   < > 139 140 140  K 4.4   < > 4.0 4.1 4.0  CL 83*   < > 86* 90* 88*  CO2 40*   < > 42* 40* 42*  GLUCOSE 122*   < > 123* 148* 127*  BUN 41*   < > 43* 34* 29*  CREATININE 1.16*   < > 1.27* 1.02* 1.06*  CALCIUM 9.2   < > 8.9 8.9 9.0  PROT 6.5  --   --   --   --   ALBUMIN 3.4*  --   --   --   --   AST 16  --   --   --   --   ALT  13  --   --   --   --   ALKPHOS 50  --   --   --   --   BILITOT 0.6  --   --   --   --   GFRNONAA 45*   < > 40* 52* 50*  GFRAA 52*   < > 46* >60 58*  ANIONGAP 10   < > 11 10 10    < > = values in this interval not displayed.     Hematology Recent Labs  Lab 12/09/19 0400 12/10/19 0431 12/11/19 0526  WBC 8.6 11.1* 8.4  RBC 4.88 4.55 4.40  HGB 12.7 12.0 11.4*  HCT 43.2 39.1 37.7  MCV 88.5 85.9 85.7  MCH 26.0 26.4 25.9*  MCHC 29.4* 30.7 30.2  RDW 15.0 15.4 15.4  PLT 418* 427* 377    Cardiac EnzymesNo results for input(s): TROPONINI in the last 168 hours. No results for input(s): TROPIPOC in the last 168 hours.   BNP Recent Labs  Lab 12/08/19 1036  BNP 395.6*     DDimer No results for input(s): DDIMER in the last 168 hours.   Radiology    CT ABDOMEN PELVIS WO CONTRAST  Result Date: 12/09/2019 IMPRESSION: 1. No acute findings identified within the abdomen or pelvis. 2. Bilateral kidney cysts. Incompletely  characterized without IV contrast. 3. Gallstones. 4. Small to moderate bilateral pleural effusions with left lower lobe atelectasis/consolidation. 5. Anasarca. 6. Aortic atherosclerosis. Aortic Atherosclerosis (ICD10-I70.0). Electronically Signed   By: Kerby Moors M.D.   On: 12/09/2019 09:31   CT Chest Wo Contrast  Result Date: 12/08/2019 IMPRESSION: Bilateral pleural effusions left greater than right with associated atelectatic changes. T9 compression deformity which appears chronic with increased sclerosis. No other focal abnormality is noted. Aortic Atherosclerosis (ICD10-I70.0). Electronically Signed   By: Inez Catalina M.D.   On: 12/08/2019 13:10    Cardiac Studies   2D echo 12/08/2019: 1. Left ventricular ejection fraction, by estimation, is 60 to 65%. The  left ventricle has normal function. The left ventricle has no regional  wall motion abnormalities. There is mild left ventricular hypertrophy.  Left ventricular diastolic parameters  are consistent with Grade II diastolic dysfunction (pseudonormalization).  Elevated left atrial pressure.  2. Pulmonary artery pressure is mildly to moderately elevated (RVSP 35  mmHg plus central venous pressure). Right ventricular systolic function is  normal. The right ventricular size is mildly enlarged. Mildly increased  right ventricular wall thickness.  3. The mitral valve is degenerative. Trivial mitral valve regurgitation.  No evidence of mitral stenosis.  4. Tricuspid valve regurgitation is mild to moderate.  5. The aortic valve was not well visualized. Aortic valve regurgitation  is not visualized. No aortic stenosis is present. __________  2D echo 12/01/2019: 2D echo 12/01/2019 performed at [facility]: INTERPRETATION  NORMAL LEFT VENTRICULAR SYSTOLIC FUNCTION  NORMAL RIGHT VENTRICULAR SYSTOLIC FUNCTION  MILD VALVULAR REGURGITATION (See above)  NO VALVULAR STENOSIS  ECHOCARDIOGRAPHIC DESCRIPTIONS  AORTIC ROOT           Size: Normal       Dissection: INDETERM FOR DISSECTION  AORTIC VALVE        Leaflets: Tricuspid          Morphology: Normal        Mobility: Fully mobile  LEFT VENTRICLE          Size: Normal            Anterior: Normal      Contraction: Normal  Lateral: Normal       Closest EF: >55% (Estimated)        Septal: Normal       LV Masses: No Masses            Apical: Normal          LVH: None             Inferior: Normal                           Posterior: Normal      Dias.FxClass: (Grade 2) relaxation abnormal, pseudonormal  MITRAL VALVE        Leaflets: Normal            Mobility: Fully mobile       Morphology: Normal  LEFT ATRIUM          Size: MILDLY ENLARGED       LA Masses: No masses       IA Septum: Normal IAS  MAIN PA          Size: Normal  PULMONIC VALVE       Morphology: Normal            Mobility: Fully mobile  RIGHT VENTRICLE       RV Masses: No Masses             Size: Normal       Free Wall: Normal          Contraction: Normal  TRICUSPID VALVE        Leaflets: Normal            Mobility: Fully mobile       Morphology: Normal  RIGHT ATRIUM          Size: MILDLY ENLARGED        RA Other: None        RA Mass: No masses  PERICARDIUM         Fluid: No effusion  INFERIOR VENACAVA          Size: Normal Normal respiratory collapse   Patient Profile     79 y.o. female with history of insulin-dependent diabetes mellitus, hypertension, hyperlipidemia, cervical/thoracic/lumbar osteoarthritis, and obesity who is being seen today for the evaluation of acute on chronic HFpEF at the request of Dr. Mortimer Fries.  Assessment & Plan     A/P: Acute on chronic diastolic CHF/pulmonary hypertension Mildly elevated right heart pressures on echo with mild to moderate TR, ejection fraction 60% Has been moved out of the ICU, initially required BiPAP, Levophed -- on nasal cannula oxygen, dramatic improvement in symptoms with diuresis, >4 liters neg (at least) Still not at her baseline Stable renal function with diuresis Consider changing IV Lasix to 40-60 twice daily dosing with close monitoring of renal function  Elevated troponin No anginal symptoms, normal ejection fraction Likely demand ischemia in the setting of heart failure outpt follow up   Total encounter time more than 25 minutes  Greater than 50% was spent in counseling and coordination of care with the patient    For questions or updates, please contact [facility] Please consult [email or web address] for contact info under Cardiology/STEMI.    Signed, Christell Faith, PA-C [place] Pager: [phone] 12/12/2019, 11:29 AM

## 2019-12-13 DIAGNOSIS — I248 Other forms of acute ischemic heart disease: Secondary | ICD-10-CM | POA: Diagnosis present

## 2019-12-13 LAB — BASIC METABOLIC PANEL
Anion gap: 12 (ref 5–15)
BUN: 32 mg/dL — ABNORMAL HIGH (ref 8–23)
CO2: 41 mmol/L — ABNORMAL HIGH (ref 22–32)
Calcium: 9.1 mg/dL (ref 8.9–10.3)
Chloride: 87 mmol/L — ABNORMAL LOW (ref 98–111)
Creatinine, Ser: 1.1 mg/dL — ABNORMAL HIGH (ref 0.44–1.00)
GFR calc Af Amer: 55 mL/min — ABNORMAL LOW (ref >60)
GFR calc non Af Amer: 48 mL/min — ABNORMAL LOW (ref >60)
Glucose, Bld: 140 mg/dL — ABNORMAL HIGH (ref 70–99)
Potassium: 4 mmol/L (ref 3.5–5.1)
Sodium: 140 mmol/L (ref 135–145)

## 2019-12-13 LAB — GLUCOSE, CAPILLARY
Glucose-Capillary: 128 mg/dL — ABNORMAL HIGH (ref 70–99)
Glucose-Capillary: 130 mg/dL — ABNORMAL HIGH (ref 70–99)
Glucose-Capillary: 131 mg/dL — ABNORMAL HIGH (ref 70–99)
Glucose-Capillary: 156 mg/dL — ABNORMAL HIGH (ref 70–99)
Glucose-Capillary: 183 mg/dL — ABNORMAL HIGH (ref 70–99)
Glucose-Capillary: 206 mg/dL — ABNORMAL HIGH (ref 70–99)

## 2019-12-13 LAB — MAGNESIUM: Magnesium: 1.7 mg/dL (ref 1.7–2.4)

## 2019-12-13 MED ORDER — SPIRONOLACTONE 25 MG PO TABS
25.0000 mg | ORAL_TABLET | Freq: Every day | ORAL | Status: DC
  Administered 2019-12-13 – 2019-12-16 (×4): 25 mg via ORAL
  Filled 2019-12-13 (×4): qty 1

## 2019-12-13 NOTE — Progress Notes (Signed)
Progress Note  Patient Name: Tammie [name] Date of Encounter: 12/13/2019  [facility] Cardiologist: Thanos Cousineau  Subjective   Leg swelling and breathing improved but not quite back to baseline.  Patient endorses mild orthopnea.  No chest pain or palpitations.  Inpatient Medications    Scheduled Meds: . aspirin EC  81 mg Oral Daily  . dextromethorphan-guaiFENesin  1 tablet Oral BID  . enoxaparin (LOVENOX) injection  40 mg Subcutaneous BID  . furosemide  60 mg Intravenous BID  . insulin aspart  0-24 Units Subcutaneous Q4H  . ipratropium-albuterol  3 mL Nebulization Once  . levothyroxine  75 mcg Oral Daily  . pantoprazole  40 mg Oral Daily  . potassium chloride  20 mEq Oral Daily  . pravastatin  20 mg Oral q1800  . sodium chloride flush  3 mL Intravenous Q12H   Continuous Infusions: . sodium chloride Stopped (12/09/19 1414)   PRN Meds: sodium chloride, acetaminophen, dextrose, docusate sodium, ipratropium-albuterol, ondansetron (ZOFRAN) IV, polyethylene glycol, sodium chloride, sodium chloride flush   Vital Signs    Vitals:   12/12/19 2256 12/13/19 0422 12/13/19 0426 12/13/19 0740  BP: 126/65 135/65  137/65  Pulse: 71 66  67  Resp: 18 16  18   Temp: 97.7 F (36.5 C) 98.5 F (36.9 C)  98 F (36.7 C)  TempSrc: Oral Oral    SpO2: 94% 98%  97%  Weight:   91.1 kg   Height:        Intake/Output Summary (Last 24 hours) at 12/13/2019 1013 Last data filed at 12/13/2019 0944 Gross per 24 hour  Intake 776 ml  Output 1550 ml  Net -774 ml   Last 3 Weights 12/13/2019 12/11/2019 12/08/2019  Weight (lbs) 200 lb 13.4 oz 219 lb 12.8 oz 221 lb  Weight (kg) 91.1 kg 99.7 kg 100.245 kg      Telemetry    NSR with PAC's. - Personally Reviewed  ECG    No new tracing.  Physical Exam   GEN: No acute distress.   Neck: No JVD, though evaluation is limited by body habitus and inability to turn neck. Cardiac: RRR, no murmurs, rubs, or gallops.  Respiratory: Mildly diminished breath  sounds throughout without wheezes or crackles. GI: Soft, nontender, non-distended  MS: Trace pedal edema bilaterally. Neuro:  Nonfocal  Psych: Normal affect   Labs    High Sensitivity Troponin:   Recent Labs  Lab 12/01/19 1257 12/08/19 1036 12/08/19 1230  TROPONINIHS 13 36* 39*      Chemistry Recent Labs  Lab 12/08/19 1036 12/09/19 0400 12/11/19 0526 12/12/19 0552 12/13/19 0602  NA 133*   < > 140 140 140  K 4.4   < > 4.1 4.0 4.0  CL 83*   < > 90* 88* 87*  CO2 40*   < > 40* 42* 41*  GLUCOSE 122*   < > 148* 127* 140*  BUN 41*   < > 34* 29* 32*  CREATININE 1.16*   < > 1.02* 1.06* 1.10*  CALCIUM 9.2   < > 8.9 9.0 9.1  PROT 6.5  --   --   --   --   ALBUMIN 3.4*  --   --   --   --   AST 16  --   --   --   --   ALT 13  --   --   --   --   ALKPHOS 50  --   --   --   --  BILITOT 0.6  --   --   --   --   GFRNONAA 45*   < > 52* 50* 48*  GFRAA 52*   < > >60 58* 55*  ANIONGAP 10   < > 10 10 12    < > = values in this interval not displayed.     Hematology Recent Labs  Lab 12/09/19 0400 12/10/19 0431 12/11/19 0526  WBC 8.6 11.1* 8.4  RBC 4.88 4.55 4.40  HGB 12.7 12.0 11.4*  HCT 43.2 39.1 37.7  MCV 88.5 85.9 85.7  MCH 26.0 26.4 25.9*  MCHC 29.4* 30.7 30.2  RDW 15.0 15.4 15.4  PLT 418* 427* 377    BNP Recent Labs  Lab 12/08/19 1036  BNP 395.6*     DDimer No results for input(s): DDIMER in the last 168 hours.   Radiology    No results found.  Cardiac Studies   1. Left ventricular ejection fraction, by estimation, is 60 to 65%. The  left ventricle has normal function. The left ventricle has no regional  wall motion abnormalities. There is mild left ventricular hypertrophy.  Left ventricular diastolic parameters  are consistent with Grade II diastolic dysfunction (pseudonormalization).  Elevated left atrial pressure.  2. Pulmonary artery pressure is mildly to moderately elevated (RVSP 35  mmHg plus central venous pressure). Right ventricular systolic  function is  normal. The right ventricular size is mildly enlarged. Mildly increased  right ventricular wall thickness.  3. The mitral valve is degenerative. Trivial mitral valve regurgitation.  No evidence of mitral stenosis.  4. Tricuspid valve regurgitation is mild to moderate.  5. The aortic valve was not well visualized. Aortic valve regurgitation  is not visualized. No aortic stenosis is present.   Patient Profile     79 y.o. female with h/o hypertension, hyperlipidemia, diabetes, obesity, and osteoarthritis, admitted with acute on chronic HFpEF.  Assessment & Plan    Acute on chronic HFpEF: Ms. Scarpino is slowly improving in regard to her edema and dyspnea.  She has only trace pedal edema on exam today.  Her weight is down 9 kg since admission with a net negative fluid balance of 5.6 L since admission (I & O's likely incompletely recorded).  BUN and creatinine remain stable, with patient currently on furosemide 60 mg IV BID (decreased from 80 mg IV BID yesterday).  I think she would benefit from at least one more day of IV diuresis.  When her BUN/creatinine begin to rise, we will transition to oral diuretics in anticipation of discharge.  Continue furosemide 60 mg IV BID.  Start spironolactone 25 mg daily.  Discontinue standing potassium supplement.  Demand ischemia: Slight troponin elevation on admission is most consistent with supply-demand mismatch in the setting of acute on chronic HFpEF.  Consider outpatient non-invasive ischemia testing.  For questions or updates, please contact [place] Please consult [email or web address] for contact info under [facility] Cardiology.     Signed, Nelva Bush, MD  12/13/2019, 10:13 AM

## 2019-12-13 NOTE — Progress Notes (Signed)
PROGRESS NOTE    Tammie [name]   MAU:[id number]  DOB: Aug 17, 1940  PCP: Dion Body, MD    DOA: 12/08/2019 LOS: 5   Brief Narrative   79 y.o. female with history of diabetes mellitus, hypertension, hyperlipidemia, and morbid obesity, presented to the ED on 12/08/19 for 6 week history of progressive dysnpea, lower extremity edema and generalized malaise and fatigue, with 20 lb weight gain in 2 months time despite compliance with her diuretics.  She had been seen by PCP where she was noted to be hypoxic with O2 sat of 71%, and she was sent to the ED.  Initially was admitted to ICU requiring Bipap and Levophed for BP support.  Cardiology consulted.  Patient's hemodynamics and respiratory status improved and hospitalist service assumed care on 12/10/19.     Assessment & Plan   Principal Problem:   Acute on chronic heart failure with preserved ejection fraction (HFpEF) (HCC) Active Problems:   Acute respiratory failure with hypoxia (HCC)   Elevated troponin   Type 2 diabetes mellitus (HCC)   Essential hypertension   Heart failure with preserved ejection fraction (HCC)   Acute respiratory failure with hypoxia - POA, secondary to Acute on chronic HFpEF -presented with hypoxia, new bilateral pleural effusions, pulmonary edema and generalized anasarca.  Was initially admitted to ICU requiring BiPAP and Levophed.  Echo showed normal EF and grade 2 diastolic dysfunction.  Improving with IV Lasix..  Cardiology is following.  Strict I/O's, daily weights.  Monitor renal function and electrolytes closely.  Needs cardiology follow up with [facility].  Net fluid balance -4.8 L to date this admission.  Continue IV Lasix 60 mg BID today, expect can transition to PO tomorrow.  Wean oxygen as tolerated, keep O2 sat > 90%.  Type 2 diabetes -continue sliding scale NovoLog and CBGs.  Continue to hold her long-acting as sugars are in the 100s without it.  Essential hypertension -chronic,  hypotensive on admission requiring Levophed.  Hypotension is improved but diastolic still in the 56L.  Continue holding antihypertensives.  Hyperlipidemia -continue statin  Morbid obesity: Body mass index is 39.22 kg/m.  Complicates overall care and prognosis.  DVT prophylaxis: enoxaparin (LOVENOX) injection 40 mg Start: 12/08/19 2200 SCDs Start: 12/08/19 1401   Diet:  Diet Orders (From admission, onward)    Start     Ordered   12/09/19 1145  Diet heart healthy/carb modified Room service appropriate? Yes; Fluid consistency: Thin  Diet effective now       Question Answer Comment  Diet-HS Snack? Nothing   Room service appropriate? Yes   Fluid consistency: Thin      12/09/19 1144            Code Status: Full Code    Subjective 12/13/19    Patient seen at bedside this morning, son present.  She reports feeling well.  Says she has had some periodic hallucinations during admission, had not mentioned previously.  Denies any last night or today.  Feet feel less swollen.  Denies SOB.  Says abdomen feeling less distended.  Continues to require 3 L/min oxygen.   Disposition Plan & Communication   Status is: Inpatient  Remains inpatient appropriate because:Inpatient level of care appropriate due to severity of illness.  Patient remains with hypoxic respiratory failure due to CHF and undergoing IV diuresis.   Dispo: The patient is from: Home              Anticipated d/c is to: Home  Anticipated d/c date is: 2-3 days              Patient currently is not medically stable to d/c.   Family Communication: sone at bedside during encounter   Consults, Procedures, Significant Events   Consultants:   Cardiology  Procedures:   Echo EF 60 to 44%, grade 2 diastolic dysfunction    Objective   Vitals:   12/12/19 2256 12/13/19 0422 12/13/19 0426 12/13/19 0740  BP: 126/65 135/65  137/65  Pulse: 71 66  67  Resp: 18 16  18   Temp: 97.7 F (36.5 C) 98.5 F (36.9 C)   98 F (36.7 C)  TempSrc: Oral Oral    SpO2: 94% 98%  97%  Weight:   91.1 kg   Height:        Intake/Output Summary (Last 24 hours) at 12/13/2019 0755 Last data filed at 12/13/2019 0425 Gross per 24 hour  Intake 776 ml  Output 800 ml  Net -24 ml   Filed Weights   12/08/19 1013 12/11/19 0428 12/13/19 0426  Weight: 100.2 kg 99.7 kg 91.1 kg    Physical Exam:  General exam: awake, alert, no acute distress, obese HEENT: moist mucus membranes, hearing grossly normal Respiratory system: diminished bases, no wheezes or rhonchi, normal respiratory effort, on 3 L/min nasal cannula oxygen. Cardiovascular system: normal S1/S2, RRR, lower extremity edema near resolved on the shins, and today her feet have wrinkled skin and less edema. Central nervous system: A&O x4. no gross focal neurologic deficits, normal speech Extremities: moves all, no cyanosis, normal tone Psychiatry: normal mood, congruent affect, judgement and insight appear normal, no hallucinations  Labs   Data Reviewed: I have personally reviewed following labs and imaging studies  CBC: Recent Labs  Lab 12/08/19 1036 12/09/19 0400 12/10/19 0431 12/11/19 0526  WBC 10.8* 8.6 11.1* 8.4  NEUTROABS  --   --  8.0* 6.0  HGB 12.0 12.7 12.0 11.4*  HCT 40.2 43.2 39.1 37.7  MCV 86.3 88.5 85.9 85.7  PLT 431* 418* 427* 010   Basic Metabolic Panel: Recent Labs  Lab 12/09/19 0400 12/10/19 0431 12/11/19 0526 12/12/19 0552 12/13/19 0602  NA 133* 139 140 140 140  K 4.8 4.0 4.1 4.0 4.0  CL 85* 86* 90* 88* 87*  CO2 37* 42* 40* 42* 41*  GLUCOSE 152* 123* 148* 127* 140*  BUN 36* 43* 34* 29* 32*  CREATININE 1.03* 1.27* 1.02* 1.06* 1.10*  CALCIUM 8.9 8.9 8.9 9.0 9.1  MG  --  1.8 1.7 1.8 1.7  PHOS  --  4.9* 3.5  --   --    GFR: Estimated Creatinine Clearance: 41.7 mL/min (A) (by C-G formula based on SCr of 1.1 mg/dL (H)). Liver Function Tests: Recent Labs  Lab 12/08/19 1036  AST 16  ALT 13  ALKPHOS 50  BILITOT 0.6  PROT  6.5  ALBUMIN 3.4*   No results for input(s): LIPASE, AMYLASE in the last 168 hours. No results for input(s): AMMONIA in the last 168 hours. Coagulation Profile: No results for input(s): INR, PROTIME in the last 168 hours. Cardiac Enzymes: No results for input(s): CKTOTAL, CKMB, CKMBINDEX, TROPONINI in the last 168 hours. BNP (last 3 results) No results for input(s): PROBNP in the last 8760 hours. HbA1C: No results for input(s): HGBA1C in the last 72 hours. CBG: Recent Labs  Lab 12/12/19 1207 12/12/19 1637 12/12/19 1959 12/12/19 2253 12/13/19 0423  GLUCAP 128* 114* 218* 145* 128*   Lipid Profile: No results for  input(s): CHOL, HDL, LDLCALC, TRIG, CHOLHDL, LDLDIRECT in the last 72 hours. Thyroid Function Tests: No results for input(s): TSH, T4TOTAL, FREET4, T3FREE, THYROIDAB in the last 72 hours. Anemia Panel: No results for input(s): VITAMINB12, FOLATE, FERRITIN, TIBC, IRON, RETICCTPCT in the last 72 hours. Sepsis Labs: Recent Labs  Lab 12/08/19 1036 12/08/19 1125  PROCALCITON <0.10  --   LATICACIDVEN  --  1.9    Recent Results (from the past 240 hour(s))  SARS Coronavirus 2 by RT PCR (hospital order, performed in [facility] hospital lab) Nasopharyngeal Nasopharyngeal Swab     Status: None   Collection Time: 12/08/19 11:25 AM   Specimen: Nasopharyngeal Swab  Result Value Ref Range Status   SARS Coronavirus 2 NEGATIVE NEGATIVE Final    Comment: (NOTE) SARS-CoV-2 target nucleic acids are NOT DETECTED.  The SARS-CoV-2 RNA is generally detectable in upper and lower respiratory specimens during the acute phase of infection. The lowest concentration of SARS-CoV-2 viral copies this assay can detect is 250 copies / mL. A negative result does not preclude SARS-CoV-2 infection and should not be used as the sole basis for treatment or other patient management decisions.  A negative result may occur with improper specimen collection / handling, submission of specimen  other than nasopharyngeal swab, presence of viral mutation(s) within the areas targeted by this assay, and inadequate number of viral copies (<250 copies / mL). A negative result must be combined with clinical observations, patient history, and epidemiological information.  Fact Sheet for Patients:   StrictlyIdeas.no  Fact Sheet for Healthcare Providers: BankingDealers.co.za  This test is not yet approved or  cleared by the [place] FDA and has been authorized for detection and/or diagnosis of SARS-CoV-2 by FDA under an Emergency Use Authorization (EUA).  This EUA will remain in effect (meaning this test can be used) for the duration of the COVID-19 declaration under Section 564(b)(1) of the Act, 21 U.S.C. section 360bbb-3(b)(1), unless the authorization is terminated or revoked sooner.  Performed at [facility], [address], [place], [place] [address]   Urine culture     Status: None   Collection Time: 12/08/19 11:28 AM   Specimen: Urine, Random  Result Value Ref Range Status   Specimen Description   Final    URINE, RANDOM Performed at [facility], [address] [place], [place] [address]    Special Requests   Final    NONE Performed at [facility], [address], [place], [place] [address]    Culture   Final    NO GROWTH Performed at [facility], [place] [address] [place], [place] [address]    Report Status 12/09/2019 FINAL  Final  MRSA PCR Screening     Status: None   Collection Time: 12/08/19  4:37 PM   Specimen: Nasopharyngeal  Result Value Ref Range Status   MRSA by PCR NEGATIVE NEGATIVE Final    Comment:        The GeneXpert MRSA Assay (FDA approved for NASAL specimens only), is one component of a comprehensive MRSA colonization surveillance program. It is not intended to diagnose MRSA infection nor to guide or monitor treatment for MRSA infections. Performed at  [facility], [address], [place], [place] [address]       Imaging Studies   No results found.   Medications   Scheduled Meds:  aspirin EC  81 mg Oral Daily   dextromethorphan-guaiFENesin  1 tablet Oral BID   enoxaparin (LOVENOX)  injection  40 mg Subcutaneous BID   furosemide  60 mg Intravenous BID   insulin aspart  0-24 Units Subcutaneous Q4H   ipratropium-albuterol  3 mL Nebulization Once   levothyroxine  75 mcg Oral Daily   pantoprazole  40 mg Oral Daily   potassium chloride  20 mEq Oral Daily   pravastatin  20 mg Oral q1800   sodium chloride flush  3 mL Intravenous Q12H   Continuous Infusions:  sodium chloride Stopped (12/09/19 1414)       LOS: 5 days    Time spent: 25 minutes    Ezekiel Slocumb, DO [facility]  12/13/2019, 7:55 AM    If 7PM-7AM, please contact night-coverage. How to contact the [facility] Attending or Consulting provider [place] or covering provider during after hours [place], for this patient?    1. Check the care team in [facility] and look for a) attending/consulting TR[facility] provider listed and b) the [facility] team listed 2. Log into [email or web address] and use [facility] [place]'s universal password to access. If you do not have the password, please contact the hospital operator. 3. Locate the [facility] provider you are looking for under [facility] and page to a number that you can be directly reached. 4. If you still have difficulty reaching the provider, please page the [facility] (Director on Call) for the Hospitalists listed on amion for assistance.

## 2019-12-14 ENCOUNTER — Inpatient Hospital Stay: Payer: Medicare HMO

## 2019-12-14 LAB — GLUCOSE, CAPILLARY
Glucose-Capillary: 125 mg/dL — ABNORMAL HIGH (ref 70–99)
Glucose-Capillary: 128 mg/dL — ABNORMAL HIGH (ref 70–99)
Glucose-Capillary: 147 mg/dL — ABNORMAL HIGH (ref 70–99)
Glucose-Capillary: 160 mg/dL — ABNORMAL HIGH (ref 70–99)
Glucose-Capillary: 228 mg/dL — ABNORMAL HIGH (ref 70–99)
Glucose-Capillary: 241 mg/dL — ABNORMAL HIGH (ref 70–99)

## 2019-12-14 LAB — BASIC METABOLIC PANEL
Anion gap: 13 (ref 5–15)
BUN: 34 mg/dL — ABNORMAL HIGH (ref 8–23)
CO2: 41 mmol/L — ABNORMAL HIGH (ref 22–32)
Calcium: 9.1 mg/dL (ref 8.9–10.3)
Chloride: 87 mmol/L — ABNORMAL LOW (ref 98–111)
Creatinine, Ser: 1.18 mg/dL — ABNORMAL HIGH (ref 0.44–1.00)
GFR calc Af Amer: 51 mL/min — ABNORMAL LOW (ref >60)
GFR calc non Af Amer: 44 mL/min — ABNORMAL LOW (ref >60)
Glucose, Bld: 142 mg/dL — ABNORMAL HIGH (ref 70–99)
Potassium: 3.7 mmol/L (ref 3.5–5.1)
Sodium: 141 mmol/L (ref 135–145)

## 2019-12-14 LAB — MAGNESIUM: Magnesium: 1.7 mg/dL (ref 1.7–2.4)

## 2019-12-14 MED ORDER — POTASSIUM CHLORIDE CRYS ER 20 MEQ PO TBCR
40.0000 meq | EXTENDED_RELEASE_TABLET | Freq: Once | ORAL | Status: AC
  Administered 2019-12-14: 40 meq via ORAL
  Filled 2019-12-14: qty 2

## 2019-12-14 MED ORDER — MAGNESIUM SULFATE 2 GM/50ML IV SOLN
2.0000 g | Freq: Once | INTRAVENOUS | Status: AC
  Administered 2019-12-14: 09:00:00 2 g via INTRAVENOUS
  Filled 2019-12-14: qty 50

## 2019-12-14 NOTE — Progress Notes (Addendum)
Entered flowsheet shift assessment under 0700 time, should have been at [phone]

## 2019-12-14 NOTE — Progress Notes (Signed)
Progress Note  Patient Name: Tammie [name] Date of Encounter: 12/14/2019  [facility] Cardiologist: New - Daivion Pape  Subjective   Breathing continues to improve and is almost at baseline.  Minimal foot/toe swelling.  No CP, palpitations, or LH.  Inpatient Medications    Scheduled Meds: . aspirin EC  81 mg Oral Daily  . dextromethorphan-guaiFENesin  1 tablet Oral BID  . enoxaparin (LOVENOX) injection  40 mg Subcutaneous BID  . furosemide  60 mg Intravenous BID  . insulin aspart  0-24 Units Subcutaneous Q4H  . ipratropium-albuterol  3 mL Nebulization Once  . levothyroxine  75 mcg Oral Daily  . pantoprazole  40 mg Oral Daily  . pravastatin  20 mg Oral q1800  . sodium chloride flush  3 mL Intravenous Q12H  . spironolactone  25 mg Oral Daily   Continuous Infusions: . sodium chloride Stopped (12/14/19 0957)   PRN Meds: sodium chloride, acetaminophen, dextrose, docusate sodium, ipratropium-albuterol, ondansetron (ZOFRAN) IV, polyethylene glycol, sodium chloride, sodium chloride flush   Vital Signs    Vitals:   12/13/19 2344 12/14/19 0341 12/14/19 0739 12/14/19 1250  BP: 131/65 (!) 150/74 138/62 130/77  Pulse: 71 67 65 75  Resp: 20 18 18 18   Temp: 98.1 F (36.7 C) 97.7 F (36.5 C) 97.8 F (36.6 C) 97.8 F (36.6 C)  TempSrc: Oral Oral    SpO2: 95% 98% 100% 94%  Weight:  91.8 kg    Height:        Intake/Output Summary (Last 24 hours) at 12/14/2019 1254 Last data filed at 12/14/2019 1058 Gross per 24 hour  Intake 635.6 ml  Output 1650 ml  Net -1014.4 ml   Last 3 Weights 12/14/2019 12/13/2019 12/11/2019  Weight (lbs) 202 lb 6.1 oz 200 lb 13.4 oz 219 lb 12.8 oz  Weight (kg) 91.8 kg 91.1 kg 99.7 kg      Telemetry    NSR - Personally Reviewed  ECG    No new tracing.  Physical Exam   GEN: No acute distress.   Neck: No JVD, though evaluation is limited by body habitus and decreased neck mobility. Cardiac: RRR, no murmurs, rubs, or gallops.  Respiratory: Mildly  diminished breath sounds at the left lung base.  Otherwise, clear lungs.  No wheezes or crackles. GI: Soft, nontender, non-distended  MS: Trace pedal edema; No deformity. Neuro:  Nonfocal  Psych: Normal affect   Labs    High Sensitivity Troponin:   Recent Labs  Lab 12/01/19 1257 12/08/19 1036 12/08/19 1230  TROPONINIHS 13 36* 39*      Chemistry Recent Labs  Lab 12/08/19 1036 12/09/19 0400 12/12/19 0552 12/13/19 0602 12/14/19 0600  NA 133*   < > 140 140 141  K 4.4   < > 4.0 4.0 3.7  CL 83*   < > 88* 87* 87*  CO2 40*   < > 42* 41* 41*  GLUCOSE 122*   < > 127* 140* 142*  BUN 41*   < > 29* 32* 34*  CREATININE 1.16*   < > 1.06* 1.10* 1.18*  CALCIUM 9.2   < > 9.0 9.1 9.1  PROT 6.5  --   --   --   --   ALBUMIN 3.4*  --   --   --   --   AST 16  --   --   --   --   ALT 13  --   --   --   --   [facility]  50  --   --   --   --   BILITOT 0.6  --   --   --   --   GFRNONAA 45*   < > 50* 48* 44*  GFRAA 52*   < > 58* 55* 51*  ANIONGAP 10   < > 10 12 13    < > = values in this interval not displayed.     Hematology Recent Labs  Lab 12/09/19 0400 12/10/19 0431 12/11/19 0526  WBC 8.6 11.1* 8.4  RBC 4.88 4.55 4.40  HGB 12.7 12.0 11.4*  HCT 43.2 39.1 37.7  MCV 88.5 85.9 85.7  MCH 26.0 26.4 25.9*  MCHC 29.4* 30.7 30.2  RDW 15.0 15.4 15.4  PLT 418* 427* 377    BNP Recent Labs  Lab 12/08/19 1036  BNP 395.6*     DDimer No results for input(s): DDIMER in the last 168 hours.   Radiology    No results found.  Cardiac Studies   TTE (12/08/19): 1. Left ventricular ejection fraction, by estimation, is 60 to 65%. The  left ventricle has normal function. The left ventricle has no regional  wall motion abnormalities. There is mild left ventricular hypertrophy.  Left ventricular diastolic parameters  are consistent with Grade II diastolic dysfunction (pseudonormalization).  Elevated left atrial pressure.  2. Pulmonary artery pressure is mildly to moderately elevated  (RVSP 35  mmHg plus central venous pressure). Right ventricular systolic function is  normal. The right ventricular size is mildly enlarged. Mildly increased  right ventricular wall thickness.  3. The mitral valve is degenerative. Trivial mitral valve regurgitation.  No evidence of mitral stenosis.  4. Tricuspid valve regurgitation is mild to moderate.  5. The aortic valve was not well visualized. Aortic valve regurgitation  is not visualized. No aortic stenosis is present.   Patient Profile     79 y.o. female with h/o hypertension, hyperlipidemia, diabetes, obesity, and osteoarthritis, admitted with acute on chronic HFpEF.  Assessment & Plan    Acute on chronic HFpEF and acute respiratory failure with hypoxia: Volume status appears clinically stable.  Weight is up 0.7 kg since yesterday, though I's & O's indicate  That the patient was net negative almost 2L yesterday (~7 L during this admission).  BUN/creatinine up minimally over the last 24 hours.  Patient still requiring 2L via [facility] for oxygen supplementation.  Continue furosemide 60 mg IV BID for one more day.  Anticipate transitioning to oral diuretics tomorrow.  Continue spironolactone 25 mg daily.  Wean supplemental oxygen, as tolerated.  Agree with non-contrasted chest CT to exclude parenchymal lung disease contributing to persistent hypoxia.  If the patient remains persistently hypoxic with unremarkable chest CT and rising BUN/creatinine in face of continued diuresis, RHC may be necessary (hopefully, this can be avoided).  Demand ischemia: No angina reported.  HS-TnI minimally elevated on admission in the setting of acute on chronic HFpEF.  No inpatient ischemia evaluation; consider outpatient myocardial perfusion stress testing.  For questions or updates, please contact [place] Please consult [email or web address] for contact info under [facility] Cardiology.     Signed, Nelva Bush, MD  12/14/2019, 12:54 PM

## 2019-12-14 NOTE — Progress Notes (Signed)
PROGRESS NOTE    Tammie [name]   LKG:[id number]  DOB: 11-Sep-1940  PCP: Dion Body, MD    DOA: 12/08/2019 LOS: 6   Brief Narrative   79 y.o. female with history of diabetes mellitus, hypertension, hyperlipidemia, and morbid obesity, presented to the ED on 12/08/19 for 6 week history of progressive dysnpea, lower extremity edema and generalized malaise and fatigue, with 20 lb weight gain in 2 months time despite compliance with her diuretics.  She had been seen by PCP where she was noted to be hypoxic with O2 sat of 71%, and she was sent to the ED.  Initially was admitted to ICU requiring Bipap and Levophed for BP support.  Cardiology consulted.  Patient's hemodynamics and respiratory status improved and hospitalist service assumed care on 12/10/19.     Assessment & Plan   Principal Problem:   Acute on chronic heart failure with preserved ejection fraction (HFpEF) (HCC) Active Problems:   Acute respiratory failure with hypoxia (HCC)   Elevated troponin   Type 2 diabetes mellitus (HCC)   Essential hypertension   Heart failure with preserved ejection fraction (HCC)   Demand ischemia (HCC)   Acute respiratory failure with hypoxia - POA, secondary to Acute on chronic HFpEF -presented with hypoxia, new bilateral pleural effusions, pulmonary edema and generalized anasarca.  Was initially admitted to ICU requiring BiPAP and Levophed.  Echo showed normal EF and grade 2 diastolic dysfunction.  Improving with IV Lasix..  Cardiology is following.  Strict I/O's, daily weights.  Monitor renal function and electrolytes closely.  Needs cardiology follow up with [facility].  Net fluid balance -6.259 L to date this admission.  Continue IV Lasix 60 mg BID again today as patient still requiring oxygen.  Expect can transition to PO tomorrow.  Spironolactone 25 mg daily.  Wean oxygen as tolerated, keep O2 sat > 90%.  Non-contrast chest CT today(7/4)  to look for parenchymal lung disease or  other cause of her ongoing hypoxia despite diuresis.  Has small bilateral pleural effusions, bibasilar atelectasis vs infiltrate (no fevers or leukocytosis and effusions were larger at time of admission, so suspect atelectasis).  Monitor closely for signs of infection with low threshold to start antibiotic.  CBC in AM.  Type 2 diabetes -continue sliding scale NovoLog and CBGs.  Continue to hold her long-acting as sugars are in the 100s without it.  Essential hypertension -chronic, hypotensive on admission requiring Levophed.  Hypotension is improved but diastolic still in the 66Y.  Continue holding antihypertensives.  Hyperlipidemia -continue statin  Morbid obesity: Body mass index is 39.53 kg/m.  Complicates overall care and prognosis.  DVT prophylaxis: enoxaparin (LOVENOX) injection 40 mg Start: 12/08/19 2200 SCDs Start: 12/08/19 1401   Diet:  Diet Orders (From admission, onward)    Start     Ordered   12/09/19 1145  Diet heart healthy/carb modified Room service appropriate? Yes; Fluid consistency: Thin  Diet effective now       Question Answer Comment  Diet-HS Snack? Nothing   Room service appropriate? Yes   Fluid consistency: Thin      12/09/19 1144            Code Status: Full Code    Subjective 12/14/19    Patient seen at bedside this morning, son present.  She had a shower this morning, feels better.  Feels steady on her feet.  Denies SOB, CP, fever/chills, N/V/D or other acute complaints.     Disposition Plan & Communication  Status is: Inpatient  Remains inpatient appropriate because:Inpatient level of care appropriate due to severity of illness.  Patient remains with hypoxic respiratory failure due to CHF and undergoing IV diuresis.   Dispo: The patient is from: Home              Anticipated d/c is to: Home              Anticipated d/c date is: 2 days              Patient currently is not medically stable to d/c.   Family Communication: son at bedside  during encounter   Consults, Procedures, Significant Events   Consultants:   Cardiology  Procedures:   Echo EF 60 to 18%, grade 2 diastolic dysfunction    Objective   Vitals:   12/14/19 0341 12/14/19 0739 12/14/19 1250 12/14/19 1542  BP: (!) 150/74 138/62 130/77 134/63  Pulse: 67 65 75 78  Resp: 18 18 18 17   Temp: 97.7 F (36.5 C) 97.8 F (36.6 C) 97.8 F (36.6 C) 97.8 F (36.6 C)  TempSrc: Oral   Oral  SpO2: 98% 100% 94% 97%  Weight: 91.8 kg     Height:        Intake/Output Summary (Last 24 hours) at 12/14/2019 1632 Last data filed at 12/14/2019 1508 Gross per 24 hour  Intake 875.6 ml  Output 950 ml  Net -74.4 ml   Filed Weights   12/11/19 0428 12/13/19 0426 12/14/19 0341  Weight: 99.7 kg 91.1 kg 91.8 kg    Physical Exam:  General exam: awake, alert, no acute distress, obese HEENT: moist mucus membranes, hearing grossly normal Respiratory system: diminished bases otherwise clear, no wheezes or rhonchi, normal respiratory effort, on 2 L/min nasal cannula oxygen. Cardiovascular system: normal S1/S2, RRR, trace lower extremity edema Central nervous system: A&O x4. no gross focal neurologic deficits, normal speech Extremities: moves all, no cyanosis, normal tone Psychiatry: normal mood, congruent affect, judgement and insight appear normal, no hallucinations  Labs   Data Reviewed: I have personally reviewed following labs and imaging studies  CBC: Recent Labs  Lab 12/08/19 1036 12/09/19 0400 12/10/19 0431 12/11/19 0526  WBC 10.8* 8.6 11.1* 8.4  NEUTROABS  --   --  8.0* 6.0  HGB 12.0 12.7 12.0 11.4*  HCT 40.2 43.2 39.1 37.7  MCV 86.3 88.5 85.9 85.7  PLT 431* 418* 427* 841   Basic Metabolic Panel: Recent Labs  Lab 12/10/19 0431 12/11/19 0526 12/12/19 0552 12/13/19 0602 12/14/19 0600  NA 139 140 140 140 141  K 4.0 4.1 4.0 4.0 3.7  CL 86* 90* 88* 87* 87*  CO2 42* 40* 42* 41* 41*  GLUCOSE 123* 148* 127* 140* 142*  BUN 43* 34* 29* 32* 34*    CREATININE 1.27* 1.02* 1.06* 1.10* 1.18*  CALCIUM 8.9 8.9 9.0 9.1 9.1  MG 1.8 1.7 1.8 1.7 1.7  PHOS 4.9* 3.5  --   --   --    GFR: Estimated Creatinine Clearance: 39.1 mL/min (A) (by C-G formula based on SCr of 1.18 mg/dL (H)). Liver Function Tests: Recent Labs  Lab 12/08/19 1036  AST 16  ALT 13  ALKPHOS 50  BILITOT 0.6  PROT 6.5  ALBUMIN 3.4*   No results for input(s): LIPASE, AMYLASE in the last 168 hours. No results for input(s): AMMONIA in the last 168 hours. Coagulation Profile: No results for input(s): INR, PROTIME in the last 168 hours. Cardiac Enzymes: No results for input(s): CKTOTAL, CKMB,  CKMBINDEX, TROPONINI in the last 168 hours. BNP (last 3 results) No results for input(s): PROBNP in the last 8760 hours. HbA1C: No results for input(s): HGBA1C in the last 72 hours. CBG: Recent Labs  Lab 12/13/19 1932 12/13/19 2342 12/14/19 0339 12/14/19 0738 12/14/19 1251  GLUCAP 183* 130* 147* 128* 160*   Lipid Profile: No results for input(s): CHOL, HDL, LDLCALC, TRIG, CHOLHDL, LDLDIRECT in the last 72 hours. Thyroid Function Tests: No results for input(s): TSH, T4TOTAL, FREET4, T3FREE, THYROIDAB in the last 72 hours. Anemia Panel: No results for input(s): VITAMINB12, FOLATE, FERRITIN, TIBC, IRON, RETICCTPCT in the last 72 hours. Sepsis Labs: Recent Labs  Lab 12/08/19 1036 12/08/19 1125  PROCALCITON <0.10  --   LATICACIDVEN  --  1.9    Recent Results (from the past 240 hour(s))  SARS Coronavirus 2 by RT PCR (hospital order, performed in [facility] hospital lab) Nasopharyngeal Nasopharyngeal Swab     Status: None   Collection Time: 12/08/19 11:25 AM   Specimen: Nasopharyngeal Swab  Result Value Ref Range Status   SARS Coronavirus 2 NEGATIVE NEGATIVE Final    Comment: (NOTE) SARS-CoV-2 target nucleic acids are NOT DETECTED.  The SARS-CoV-2 RNA is generally detectable in upper and lower respiratory specimens during the acute phase of infection. The  lowest concentration of SARS-CoV-2 viral copies this assay can detect is 250 copies / mL. A negative result does not preclude SARS-CoV-2 infection and should not be used as the sole basis for treatment or other patient management decisions.  A negative result may occur with improper specimen collection / handling, submission of specimen other than nasopharyngeal swab, presence of viral mutation(s) within the areas targeted by this assay, and inadequate number of viral copies (<250 copies / mL). A negative result must be combined with clinical observations, patient history, and epidemiological information.  Fact Sheet for Patients:   StrictlyIdeas.no  Fact Sheet for Healthcare Providers: BankingDealers.co.za  This test is not yet approved or  cleared by the [place] FDA and has been authorized for detection and/or diagnosis of SARS-CoV-2 by FDA under an Emergency Use Authorization (EUA).  This EUA will remain in effect (meaning this test can be used) for the duration of the COVID-19 declaration under Section 564(b)(1) of the Act, 21 U.S.C. section 360bbb-3(b)(1), unless the authorization is terminated or revoked sooner.  Performed at [facility], [address], [place], [place] [address]   Urine culture     Status: None   Collection Time: 12/08/19 11:28 AM   Specimen: Urine, Random  Result Value Ref Range Status   Specimen Description   Final    URINE, RANDOM Performed at [facility], [address], [place], [place] [address]    Special Requests   Final    NONE Performed at [facility], [address], [place], [place] [address]    Culture   Final    NO GROWTH Performed at [facility], [place] [address], [place],  [address]    Report Status 12/09/2019 FINAL  Final  MRSA PCR Screening     Status: None   Collection Time: 12/08/19  4:37 PM   Specimen: Nasopharyngeal  Result  Value Ref Range Status   MRSA by PCR NEGATIVE NEGATIVE Final    Comment:        The GeneXpert MRSA Assay (FDA approved for NASAL specimens only), is one component of a comprehensive MRSA colonization surveillance program. It is not intended to diagnose MRSA infection  nor to guide or monitor treatment for MRSA infections. Performed at [facility], [address], [place], [place] [address]       Imaging Studies   CT CHEST WO CONTRAST  Result Date: 12/14/2019 CLINICAL DATA:  Shortness of breath. EXAM: CT CHEST WITHOUT CONTRAST TECHNIQUE: Multidetector CT imaging of the chest was performed following the standard protocol without IV contrast. COMPARISON:  December 08, 2019 FINDINGS: Cardiovascular: There is moderate severity calcification of the aortic arch. Normal heart size. No pericardial effusion. Mediastinum/Nodes: No enlarged mediastinal or axillary lymph nodes. Thyroid gland, trachea, and esophagus demonstrate no significant findings. Lungs/Pleura: A 2 mm noncalcified lung nodule is seen within the right apex. Stable moderate severity areas of atelectasis and/or infiltrate are seen within the bilateral lung bases. Small bilateral pleural effusions are noted. These are mildly decreased in size when compared to the prior exam. No pneumothorax is identified. Upper Abdomen: No acute abnormality. Musculoskeletal: Moderate to marked severity multilevel degenerative changes seen throughout the thoracic spine. A chronic compression deformity of the T9 vertebral body is again noted. IMPRESSION: 1. Stable moderate severity bibasilar atelectasis and/or infiltrate. 2. Small bilateral pleural effusions, mildly decreased in size when compared to the prior exam. 3. 2 mm noncalcified lung nodule within the right apex. Correlation with 12 month follow-up chest CT is recommended to determine stability. 4. 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 5. Aortic atherosclerosis. Aortic  Atherosclerosis (ICD10-I70.0). Electronically Signed   By: Virgina Norfolk M.D.   On: 12/14/2019 15:09     Medications   Scheduled Meds: . aspirin EC  81 mg Oral Daily  . dextromethorphan-guaiFENesin  1 tablet Oral BID  . enoxaparin (LOVENOX) injection  40 mg Subcutaneous BID  . furosemide  60 mg Intravenous BID  . insulin aspart  0-24 Units Subcutaneous Q4H  . ipratropium-albuterol  3 mL Nebulization Once  . levothyroxine  75 mcg Oral Daily  . pantoprazole  40 mg Oral Daily  . pravastatin  20 mg Oral q1800  . sodium chloride flush  3 mL Intravenous Q12H  . spironolactone  25 mg Oral Daily   Continuous Infusions: . sodium chloride Stopped (12/14/19 0957)       LOS: 6 days    Time spent: 25 minutes    Ezekiel Slocumb, DO [facility]  12/14/2019, 4:32 PM    If 7PM-7AM, please contact night-coverage. How to contact the [facility] Attending or Consulting provider [place] or covering provider during after hours [place], for this patient?    1. Check the care team in [facility] and look for a) attending/consulting TRH provider listed and b) the [facility] team listed 2. Log into [email or web address] and use [place]'s universal password to access. If you do not have the password, please contact the hospital operator. 3. Locate the [facility] provider you are looking for under [facility] and page to a number that you can be directly reached. 4. If you still have difficulty reaching the provider, please page the [facility] (Director on Call) for the Hospitalists listed on amion for assistance.

## 2019-12-15 LAB — CBC WITH DIFFERENTIAL/PLATELET
Abs Immature Granulocytes: 0.04 10*3/uL (ref 0.00–0.07)
Basophils Absolute: 0 10*3/uL (ref 0.0–0.1)
Basophils Relative: 0 %
Eosinophils Absolute: 0.4 10*3/uL (ref 0.0–0.5)
Eosinophils Relative: 4 %
HCT: 40.8 % (ref 36.0–46.0)
Hemoglobin: 12.4 g/dL (ref 12.0–15.0)
Immature Granulocytes: 0 %
Lymphocytes Relative: 15 %
Lymphs Abs: 1.4 10*3/uL (ref 0.7–4.0)
MCH: 25.5 pg — ABNORMAL LOW (ref 26.0–34.0)
MCHC: 30.4 g/dL (ref 30.0–36.0)
MCV: 84 fL (ref 80.0–100.0)
Monocytes Absolute: 0.8 10*3/uL (ref 0.1–1.0)
Monocytes Relative: 9 %
Neutro Abs: 6.9 10*3/uL (ref 1.7–7.7)
Neutrophils Relative %: 72 %
Platelets: 325 10*3/uL (ref 150–400)
RBC: 4.86 MIL/uL (ref 3.87–5.11)
RDW: 15.5 % (ref 11.5–15.5)
WBC: 9.6 10*3/uL (ref 4.0–10.5)
nRBC: 0 % (ref 0.0–0.2)

## 2019-12-15 LAB — BASIC METABOLIC PANEL
Anion gap: 10 (ref 5–15)
BUN: 37 mg/dL — ABNORMAL HIGH (ref 8–23)
CO2: 42 mmol/L — ABNORMAL HIGH (ref 22–32)
Calcium: 9.2 mg/dL (ref 8.9–10.3)
Chloride: 88 mmol/L — ABNORMAL LOW (ref 98–111)
Creatinine, Ser: 1.1 mg/dL — ABNORMAL HIGH (ref 0.44–1.00)
GFR calc Af Amer: 55 mL/min — ABNORMAL LOW (ref >60)
GFR calc non Af Amer: 48 mL/min — ABNORMAL LOW (ref >60)
Glucose, Bld: 158 mg/dL — ABNORMAL HIGH (ref 70–99)
Potassium: 3.6 mmol/L (ref 3.5–5.1)
Sodium: 140 mmol/L (ref 135–145)

## 2019-12-15 LAB — FIBRIN DERIVATIVES D-DIMER (ARMC ONLY): Fibrin derivatives D-dimer (ARMC): 490.85 ng/mL (FEU) (ref 0.00–499.00)

## 2019-12-15 LAB — MAGNESIUM: Magnesium: 1.8 mg/dL (ref 1.7–2.4)

## 2019-12-15 LAB — GLUCOSE, CAPILLARY
Glucose-Capillary: 135 mg/dL — ABNORMAL HIGH (ref 70–99)
Glucose-Capillary: 144 mg/dL — ABNORMAL HIGH (ref 70–99)
Glucose-Capillary: 160 mg/dL — ABNORMAL HIGH (ref 70–99)
Glucose-Capillary: 162 mg/dL — ABNORMAL HIGH (ref 70–99)
Glucose-Capillary: 192 mg/dL — ABNORMAL HIGH (ref 70–99)

## 2019-12-15 LAB — PROCALCITONIN: Procalcitonin: <0.1 ng/mL

## 2019-12-15 MED ORDER — HYDROCERIN EX CREA
TOPICAL_CREAM | Freq: Two times a day (BID) | CUTANEOUS | Status: DC
  Administered 2019-12-16: 1 via TOPICAL
  Filled 2019-12-15 (×2): qty 113

## 2019-12-15 MED ORDER — TORSEMIDE 20 MG PO TABS
40.0000 mg | ORAL_TABLET | Freq: Two times a day (BID) | ORAL | Status: DC
  Administered 2019-12-15 – 2019-12-16 (×3): 40 mg via ORAL
  Filled 2019-12-15 (×3): qty 2

## 2019-12-15 MED ORDER — POTASSIUM CHLORIDE CRYS ER 20 MEQ PO TBCR
40.0000 meq | EXTENDED_RELEASE_TABLET | Freq: Every day | ORAL | Status: DC
  Administered 2019-12-15 – 2019-12-16 (×2): 40 meq via ORAL
  Filled 2019-12-15 (×2): qty 2

## 2019-12-15 NOTE — Care Management Important Message (Signed)
Important Message  Patient Details  Name: Tammie [name] MRN: [id number] Date of Birth: Sep 24, 1940   Medicare Important Message Given:  Yes     Juliann Pulse A Pati Thinnes 12/15/2019, 11:43 AM

## 2019-12-15 NOTE — Progress Notes (Signed)
Nutrition Brief Note   RD received consult for Diet Education. Pt previously received low sodium and diabetes diet education on 6/29. Met with pt in room today to provide additional education. Pt requesting that RD provides diet education to her daughter-in-law, Tammie [name] as pt will be discharging to her home. Called and LVM for [name] at [phone] requesting a return call.   Koleen Distance MS, RD, LDN Please refer to [facility] for RD and/or RD on-call/weekend/after hours pager

## 2019-12-15 NOTE — Progress Notes (Signed)
Physical Therapy Treatment Patient Details Name: Tammie Tammie [name] MRN: [id number] DOB: 04-15-1941 Today's Date: 12/15/2019    History of Present Illness Tammie Tammie [name] is a 44yoF who comes to [facility] on 6/28 Tammie [name] SpO2: 70s%. Pt reports 2w SOB. CT shows B effusion. Pt reports LEE and ABD distention recently.    PT Comments    Pt in bed this morning, agreeable to treatment session, reports to feel improved still. Pt AMB 225ft around unit with RW on room air, no LOB and no SOB, but O2Sats remain in mid to low 80s%, 84% at end of walk. Pt repeats same walk on 1L/min maintains sats 92% or better. Pt still feels unsafe AMB without device, but has a RW at home and appears safe and confident with RW here. Pt left up in chair on 1L/min.    Follow Up Recommendations  Home health PT;Supervision for mobility/OOB     Equipment Recommendations  None recommended by PT;Other (comment) (may need home O2?)    Recommendations for Other Services       Precautions / Restrictions Precautions Precautions: Fall Restrictions Other Position/Activity Restrictions: monitor O2 throughout    Mobility  Bed Mobility Overal bed mobility: Modified Independent             General bed mobility comments: labored effort, HOB elevated  Transfers Overall transfer level: Needs assistance Equipment used: Rolling walker (2 wheeled) Transfers: Sit to/from Stand Sit to Stand: Modified independent (Device/Increase time)         General transfer comment: Supervision provided.  Ambulation/Gait Ambulation/Gait assistance: Min guard Gait Distance (Feet): 250 Feet (2x221ft on RA and then 1L; 84% and 92% respectively.) Assistive device: Rolling walker (2 wheeled) Gait Pattern/deviations: Step-to pattern Gait velocity: 0.19m/s       Stairs             Wheelchair Mobility    Modified Rankin (Stroke Patients Only)       Balance Overall balance assessment: Modified Independent                                           Cognition Arousal/Alertness: Awake/alert Behavior During Therapy: WFL for tasks assessed/performed Overall Cognitive Status: Within Functional Limits for tasks assessed                                        Exercises      General Comments        Pertinent Vitals/Pain Pain Assessment: No/denies pain    Home Living                      Prior Function            PT Goals (current goals can now be found in the care plan section) Acute Rehab PT Goals Patient Stated Goal: To go home soon PT Goal Formulation: With patient Time For Goal Achievement: 12/25/19 Potential to Achieve Goals: Good Progress towards PT goals: Progressing toward goals    Frequency    Min 2X/week      PT Plan Current plan remains appropriate    Co-evaluation              AM-PAC PT "6 Clicks" Mobility   Outcome Measure  Help needed turning from your back to your  side while in a flat bed without using bedrails?: None Help needed moving from lying on your back to sitting on the side of a flat bed without using bedrails?: None Help needed moving to and from a bed to a chair (including a wheelchair)?: A Little Help needed standing up from a chair using your arms (e.g., wheelchair or bedside chair)?: A Little Help needed to walk in hospital room?: A Little Help needed climbing 3-5 steps with a railing? : A Lot 6 Click Score: 19    End of Session Equipment Utilized During Treatment: Gait belt Activity Tolerance: Patient tolerated treatment well Patient left: with call bell/phone within reach;in chair;with chair alarm set Nurse Communication: Mobility status PT Visit Diagnosis: Unsteadiness on feet (R26.81);Difficulty in walking, not elsewhere classified (R26.2)     Time: 4174-0814 PT Time Calculation (min) (ACUTE ONLY): 26 min  Charges:  $Therapeutic Exercise: 23-37 mins                     11:53 AM,  12/15/19 Etta Grandchild, PT, DPT Physical Therapist - [facility]  [phone] ([place])   Tammie Tammie [name] 12/15/2019, 11:50 AM

## 2019-12-15 NOTE — Progress Notes (Signed)
Occupational Therapy Treatment Patient Details Name: Tammie [name] MRN: [id number] DOB: Dec 05, 1940 Today's Date: 12/15/2019    History of present illness Tammie [name] is a 70yoF who comes to [facility] on 6/28 c SpO2: 70s%. Pt reports 2w SOB. CT shows B effusion. Pt reports LEE and ABD distention recently.   OT comments  Pt presents awake in bed this morning on 3L oxygen, SpO2 92%, HR 71. She completed bed mobility with Mod I, sit to stand transfers and ambulation using RW with Standby A for IV pole mgt. Pt stood at the sink x5 min during oral care, demonstrating improved activity tolerance and SpO2 >90% throughout. Decreased oxygen to 2L for duration of session, SpO2 remained WFL. Returned to 3L at end of session. Pt ambulated using RW to toilet and completed transfers and hygiene with Standby A. She independently verbalized 2 energy conservation strategies for home ADL routine and successfully incorporated pursed lip breathing with one verbal cue. Pt continues to require Max A for donning/doffing socks. She will continue to benefit from skilled acute OT services to address LB ADL and activity tolerance to maximize pt independence.        Follow Up Recommendations  Home health OT    Equipment Recommendations  None recommended by OT    Recommendations for Other Services      Precautions / Restrictions Precautions Precautions: Fall Restrictions Weight Bearing Restrictions: No Other Position/Activity Restrictions: monitor O2 throughout       Mobility Bed Mobility Overal bed mobility: Modified Independent             General bed mobility comments: HOB elevated, increased time  Transfers Overall transfer level: Needs assistance Equipment used: Rolling walker (2 wheeled) Transfers: Sit to/from Stand Sit to Stand: Modified independent (Device/Increase time)         General transfer comment: Supervision provided.    Balance Overall balance assessment: Modified  Independent                                         ADL either performed or assessed with clinical judgement   ADL Overall ADL's : Needs assistance/impaired     Grooming: Standing;With adaptive equipment;Supervision/safety;Brushing hair;Oral care Grooming Details (indicate cue type and reason): maintained standing at sink x5 min with SpO2 91-94% on 3L oxygen, used RW for intermittent support, HR increased to 102                 Toilet Transfer: Supervision/safety;RW;Ambulation;Regular Museum/gallery exhibitions officer and Hygiene: Supervision/safety;Sitting/lateral lean       Functional mobility during ADLs: Supervision/safety;Rolling walker General ADL Comments: Pt completes ADL with SpO2 >90% on 3L oxygen, decreased to 2L and SpO2 remained WFL. She has difficulty reaching her feet to don/doff socks at baseline, requires MAX A.     Vision Baseline Vision/History: Wears glasses Wears Glasses: At all times     Perception     Praxis      Cognition Arousal/Alertness: Awake/alert Behavior During Therapy: WFL for tasks assessed/performed Overall Cognitive Status: Within Functional Limits for tasks assessed                                          Exercises Other Exercises Other Exercises: Reviewed education re: pursed lip breathing, energy conservation strategies and  self-management of symptoms for increased safety and independence Other Exercises: Facilitated grooming task while standing at sink x5 min for increased activity tolerance   Shoulder Instructions       General Comments      Pertinent Vitals/ Pain       Pain Assessment: No/denies pain  Home Living                                          Prior Functioning/Environment              Frequency  Min 2X/week        Progress Toward Goals  OT Goals(current goals can now be found in the care plan section)  Progress towards OT goals:  Progressing toward goals  Acute Rehab OT Goals Patient Stated Goal: To go home soon OT Goal Formulation: With patient Time For Goal Achievement: 12/25/19 Potential to Achieve Goals: Good  Plan Discharge plan remains appropriate;Frequency remains appropriate    Co-evaluation                 AM-PAC OT "6 Clicks" Daily Activity     Outcome Measure   Help from another person eating meals?: None Help from another person taking care of personal grooming?: None Help from another person toileting, which includes using toliet, bedpan, or urinal?: None Help from another person bathing (including washing, rinsing, drying)?: A Lot Help from another person to put on and taking off regular upper body clothing?: A Little Help from another person to put on and taking off regular lower body clothing?: A Lot 6 Click Score: 19    End of Session Equipment Utilized During Treatment: Gait belt;Rolling walker  OT Visit Diagnosis: History of falling (Z91.81);Muscle weakness (generalized) (M62.81);Unsteadiness on feet (R26.81)   Activity Tolerance Patient tolerated treatment well   Patient Left in bed;with call bell/phone within reach;with bed alarm set   Nurse Communication          Time: 4076-8088 OT Time Calculation (min): 45 min  Charges: OT General Charges $OT Visit: 1 Visit OT Treatments $Self Care/Home Management : 38-52 mins  Jerilynn Birkenhead, OTS 12/15/19, 12:48 PM

## 2019-12-15 NOTE — Progress Notes (Addendum)
PROGRESS NOTE    KEYONTA MADRID   MEQ:[id number]  DOB: 12-11-1940  PCP: Dion Body, MD    DOA: 12/08/2019 LOS: 7   Brief Narrative   79 y.o. female with history of diabetes mellitus, hypertension, hyperlipidemia, and morbid obesity, presented to the ED on 12/08/19 for 6 week history of progressive dysnpea, lower extremity edema and generalized malaise and fatigue, with 20 lb weight gain in 2 months time despite compliance with her diuretics.  She had been seen by PCP where she was noted to be hypoxic with O2 sat of 71%, and she was sent to the ED.  Initially was admitted to ICU requiring Bipap and Levophed for BP support.  Cardiology consulted.  Patient's hemodynamics and respiratory status improved and hospitalist service assumed care on 12/10/19.     Assessment & Plan   Principal Problem:   Acute on chronic heart failure with preserved ejection fraction (HFpEF) (HCC) Active Problems:   Acute respiratory failure with hypoxia (HCC)   Elevated troponin   Type 2 diabetes mellitus (HCC)   Essential hypertension   Heart failure with preserved ejection fraction (HCC)   Demand ischemia (HCC)   Acute respiratory failure with hypoxia and hypercarbia - POA, secondary to Acute on chronic HFpEF -presented with hypoxia, new bilateral pleural effusions, pulmonary edema and generalized anasarca.  Was initially admitted to ICU requiring BiPAP and Levophed.  Echo showed normal EF and grade 2 diastolic dysfunction.  Improving with IV Lasix..  Cardiology is following.  Strict I/O's, daily weights.  Monitor renal function and electrolytes closely.  Needs cardiology follow up with [facility].  Net fluid balance negative 6+ L to date this admission.  Transitioned to torsemide today.  Spironolactone 25 mg daily.  Wean oxygen as tolerated, keep O2 sat > 90%.  Dietician consulted for diet education/counseling for CHF.  Non-contrast chest CT 7/4  to look for parenchymal lung disease or other  cause of her ongoing hypoxia despite diuresis.  Has small bilateral pleural effusions, bibasilar atelectasis vs infiltrate (no respiratory symptoms, no fevers or leukocytosis and effusions were larger at time of admission, so suspect atelectasis).  D-dimer negative rules out PE.  Negative procalcitonin.  Monitor closely for signs of infection.  Follow CBC.  Hypercarbia Patient continues to exhibit signs of hypercapnia associated with morbid obesity that is causing thoracic restriction.  Interruption or failure to provide NIV would quickly lead to exacerbation of the patient's condition, hospital admission, and likely harm to the patient. Continued use is preferred.  The use of the NIV will treat patient's high PC02 levels and can reduce risk of exacerbations and future hospitalizations when used at night and during the day.  BiLevel/RAD has been considered and ruled out as patient requires continuous alarms, backup battery, and portability which are not possible with BiLevel/RAD devices.  Ventilation is required to decrease the work of breathing and improve pulmonary status. Interruption of ventilator support would lead to decline of health status.  Patient is able to protect their airways and clear secretions on their own.   Type 2 diabetes -continue sliding scale NovoLog and CBGs.  Continue to hold her long-acting as sugars are in the 100s without it.  Patient requiring much less insulin that at home.  Will need to watch sugars closely and follow up after discharge.  Essential hypertension -chronic, hypotensive on admission requiring Levophed.  Hypotension is improved but diastolic still in the 29N.  Continue holding antihypertensives.  Hyperlipidemia -continue statin  Morbid obesity: Body  mass index is 39.14 kg/m.  Complicates overall care and prognosis.  DVT prophylaxis: enoxaparin (LOVENOX) injection 40 mg Start: 12/08/19 2200 SCDs Start: 12/08/19 1401   Diet:  Diet Orders (From admission,  onward)    Start     Ordered   12/09/19 1145  Diet heart healthy/carb modified Room service appropriate? Yes; Fluid consistency: Thin  Diet effective now       Question Answer Comment  Diet-HS Snack? Nothing   Room service appropriate? Yes   Fluid consistency: Thin      12/09/19 1144            Code Status: Full Code    Subjective 12/15/19    Patient seen up in chair today.  No family present today.  She walked around the unit with therapy this AM, says it felt good.  Says her toes still feel tight, maybe from swelling, but leg swelling nearly gone.  Denies any respiratory symptoms to suggest pneumonia.  We discussed results of CT chest yesterday.  Denies cough, SOB, CP, fever/chills, N/V/D or other acute complaints.     Disposition Plan & Communication   Status is: Inpatient  Remains inpatient appropriate because:Inpatient level of care appropriate due to severity of illness.  Patient remains with hypoxic respiratory failure due to CHF and undergoing diuresis.  Continuing to wean O2 and monitor volume status on oral diuretic.   Dispo: The patient is from: Home              Anticipated d/c is to: Home              Anticipated d/c date is: 1 day              Patient currently is not medically stable to d/c.   Family Communication: none at bedside during encounter, will attempt to call   Consults, Procedures, Significant Events   Consultants:   Cardiology  Procedures:   Echo EF 60 to 38%, grade 2 diastolic dysfunction    Objective   Vitals:   12/14/19 1934 12/14/19 2337 12/15/19 0346 12/15/19 0500  BP: (!) 144/62 129/60 (!) 119/47   Pulse: 93 68 75   Resp: 20 18 18    Temp: 98 F (36.7 C) 98 F (36.7 C) (!) 97.4 F (36.3 C)   TempSrc:   Oral   SpO2: 95% 95% 95%   Weight:    90.9 kg  Height:        Intake/Output Summary (Last 24 hours) at 12/15/2019 0755 Last data filed at 12/15/2019 0420 Gross per 24 hour  Intake 775.6 ml  Output 1000 ml  Net -224.4 ml     Filed Weights   12/13/19 0426 12/14/19 0341 12/15/19 0500  Weight: 91.1 kg 91.8 kg 90.9 kg    Physical Exam:  General exam: awake, alert, no acute distress, obese HEENT: moist mucus membranes, hearing grossly normal Respiratory system: diminished left base otherwise CTAB, no wheezes or rhonchi, normal respiratory effort, on 1 L/min nasal cannula oxygen. Cardiovascular system: normal S1/S2, RRR, barely trace lower extremity edema Central nervous system: A&O x4. no gross focal neurologic deficits, normal speech Extremities: moves all, no cyanosis, normal tone Psychiatry: normal mood, congruent affect, judgement and insight appear normal, no hallucinations  Labs   Data Reviewed: I have personally reviewed following labs and imaging studies  CBC: Recent Labs  Lab 12/08/19 1036 12/09/19 0400 12/10/19 0431 12/11/19 0526 12/15/19 0524  WBC 10.8* 8.6 11.1* 8.4 9.6  NEUTROABS  --   --  8.0* 6.0 6.9  HGB 12.0 12.7 12.0 11.4* 12.4  HCT 40.2 43.2 39.1 37.7 40.8  MCV 86.3 88.5 85.9 85.7 84.0  PLT 431* 418* 427* 377 782   Basic Metabolic Panel: Recent Labs  Lab 12/10/19 0431 12/10/19 0431 12/11/19 0526 12/12/19 0552 12/13/19 0602 12/14/19 0600 12/15/19 0524  NA 139   < > 140 140 140 141 140  K 4.0   < > 4.1 4.0 4.0 3.7 3.6  CL 86*   < > 90* 88* 87* 87* 88*  CO2 42*   < > 40* 42* 41* 41* 42*  GLUCOSE 123*   < > 148* 127* 140* 142* 158*  BUN 43*   < > 34* 29* 32* 34* 37*  CREATININE 1.27*   < > 1.02* 1.06* 1.10* 1.18* 1.10*  CALCIUM 8.9   < > 8.9 9.0 9.1 9.1 9.2  MG 1.8   < > 1.7 1.8 1.7 1.7 1.8  PHOS 4.9*  --  3.5  --   --   --   --    < > = values in this interval not displayed.   GFR: Estimated Creatinine Clearance: 41.7 mL/min (A) (by C-G formula based on SCr of 1.1 mg/dL (H)). Liver Function Tests: Recent Labs  Lab 12/08/19 1036  AST 16  ALT 13  ALKPHOS 50  BILITOT 0.6  PROT 6.5  ALBUMIN 3.4*   No results for input(s): LIPASE, AMYLASE in the last 168  hours. No results for input(s): AMMONIA in the last 168 hours. Coagulation Profile: No results for input(s): INR, PROTIME in the last 168 hours. Cardiac Enzymes: No results for input(s): CKTOTAL, CKMB, CKMBINDEX, TROPONINI in the last 168 hours. BNP (last 3 results) No results for input(s): PROBNP in the last 8760 hours. HbA1C: No results for input(s): HGBA1C in the last 72 hours. CBG: Recent Labs  Lab 12/14/19 1251 12/14/19 1827 12/14/19 1932 12/14/19 2334 12/15/19 0344  GLUCAP 160* 241* 228* 125* 162*   Lipid Profile: No results for input(s): CHOL, HDL, LDLCALC, TRIG, CHOLHDL, LDLDIRECT in the last 72 hours. Thyroid Function Tests: No results for input(s): TSH, T4TOTAL, FREET4, T3FREE, THYROIDAB in the last 72 hours. Anemia Panel: No results for input(s): VITAMINB12, FOLATE, FERRITIN, TIBC, IRON, RETICCTPCT in the last 72 hours. Sepsis Labs: Recent Labs  Lab 12/08/19 1036 12/08/19 1125 12/15/19 0524  PROCALCITON <0.10  --  <0.10  LATICACIDVEN  --  1.9  --     Recent Results (from the past 240 hour(s))  SARS Coronavirus 2 by RT PCR (hospital order, performed in [facility] hospital lab) Nasopharyngeal Nasopharyngeal Swab     Status: None   Collection Time: 12/08/19 11:25 AM   Specimen: Nasopharyngeal Swab  Result Value Ref Range Status   SARS Coronavirus 2 NEGATIVE NEGATIVE Final    Comment: (NOTE) SARS-CoV-2 target nucleic acids are NOT DETECTED.  The SARS-CoV-2 RNA is generally detectable in upper and lower respiratory specimens during the acute phase of infection. The lowest concentration of SARS-CoV-2 viral copies this assay can detect is 250 copies / mL. A negative result does not preclude SARS-CoV-2 infection and should not be used as the sole basis for treatment or other patient management decisions.  A negative result may occur with improper specimen collection / handling, submission of specimen other than nasopharyngeal swab, presence of viral mutation(s)  within the areas targeted by this assay, and inadequate number of viral copies (<250 copies / mL). A negative result must be combined with clinical observations, patient  history, and epidemiological information.  Fact Sheet for Patients:   StrictlyIdeas.no  Fact Sheet for Healthcare Providers: BankingDealers.co.za  This test is not yet approved or  cleared by the [place] FDA and has been authorized for detection and/or diagnosis of SARS-CoV-2 by FDA under an Emergency Use Authorization (EUA).  This EUA will remain in effect (meaning this test can be used) for the duration of the COVID-19 declaration under Section 564(b)(1) of the Act, 21 U.S.C. section 360bbb-3(b)(1), unless the authorization is terminated or revoked sooner.  Performed at [facility], [address], [place], [place] [address]   Urine culture     Status: None   Collection Time: 12/08/19 11:28 AM   Specimen: Urine, Random  Result Value Ref Range Status   Specimen Description   Final    URINE, RANDOM Performed at [facility], [address] [place], [place] [address]    Special Requests   Final    NONE Performed at [facility], [address] [place], [place] [address]    Culture   Final    NO GROWTH Performed at [facility], [place] [address] [place], [place] [address]    Report Status 12/09/2019 FINAL  Final  MRSA PCR Screening     Status: None   Collection Time: 12/08/19  4:37 PM   Specimen: Nasopharyngeal  Result Value Ref Range Status   MRSA by PCR NEGATIVE NEGATIVE Final    Comment:        The GeneXpert MRSA Assay (FDA approved for NASAL specimens only), is one component of a comprehensive MRSA colonization surveillance program. It is not intended to diagnose MRSA infection nor to guide or monitor treatment for MRSA infections. Performed at [facility], [address], [place], [place]  [address]       Imaging Studies   CT CHEST WO CONTRAST  Result Date: 12/14/2019 CLINICAL DATA:  Shortness of breath. EXAM: CT CHEST WITHOUT CONTRAST TECHNIQUE: Multidetector CT imaging of the chest was performed following the standard protocol without IV contrast. COMPARISON:  December 08, 2019 FINDINGS: Cardiovascular: There is moderate severity calcification of the aortic arch. Normal heart size. No pericardial effusion. Mediastinum/Nodes: No enlarged mediastinal or axillary lymph nodes. Thyroid gland, trachea, and esophagus demonstrate no significant findings. Lungs/Pleura: A 2 mm noncalcified lung nodule is seen within the right apex. Stable moderate severity areas of atelectasis and/or infiltrate are seen within the bilateral lung bases. Small bilateral pleural effusions are noted. These are mildly decreased in size when compared to the prior exam. No pneumothorax is identified. Upper Abdomen: No acute abnormality. Musculoskeletal: Moderate to marked severity multilevel degenerative changes seen throughout the thoracic spine. A chronic compression deformity of the T9 vertebral body is again noted. IMPRESSION: 1. Stable moderate severity bibasilar atelectasis and/or infiltrate. 2. Small bilateral pleural effusions, mildly decreased in size when compared to the prior exam. 3. 2 mm noncalcified lung nodule within the right apex. Correlation with 12 month follow-up chest CT is recommended to determine stability. 4. 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 5. Aortic atherosclerosis. Aortic Atherosclerosis (ICD10-I70.0). Electronically Signed   By: Virgina Norfolk M.D.   On: 12/14/2019 15:09     Medications   Scheduled Meds: . aspirin EC  81 mg Oral Daily  . dextromethorphan-guaiFENesin  1 tablet Oral BID  . enoxaparin (LOVENOX) injection  40 mg Subcutaneous BID  . insulin aspart  0-24 Units Subcutaneous Q4H  . ipratropium-albuterol  3 mL Nebulization  Once  . levothyroxine  75 mcg Oral  Daily  . pantoprazole  40 mg Oral Daily  . potassium chloride  40 mEq Oral Daily  . pravastatin  20 mg Oral q1800  . sodium chloride flush  3 mL Intravenous Q12H  . spironolactone  25 mg Oral Daily  . torsemide  40 mg Oral BID   Continuous Infusions: . sodium chloride Stopped (12/14/19 0957)       LOS: 7 days    Time spent: 25 minutes    Ezekiel Slocumb, DO [facility]  12/15/2019, 7:55 AM    If 7PM-7AM, please contact night-coverage. How to contact the [facility] Attending or Consulting provider [place] or covering provider during after hours [place], for this patient?    1. Check the care team in [facility] and look for a) attending/consulting [facility] provider listed and b) the [facility] team listed 2. Log into [email or web address] and use [place]'s universal password to access. If you do not have the password, please contact the hospital operator. 3. Locate the [facility] provider you are looking for under [facility] and page to a number that you can be directly reached. 4. If you still have difficulty reaching the provider, please page the [facility] (Director on Call) for the Hospitalists listed on amion for assistance.

## 2019-12-15 NOTE — Progress Notes (Signed)
Progress Note  Patient Name: Tammie [name] Date of Encounter: 12/15/2019  [facility] Cardiologist: Quanisha Drewry  Subjective   Breathing continues to improve and feels back to baseline.  Toes a little stiff; wonders if it is due to edema.  No chest pain, palpitations, or lightheadedness.  Inpatient Medications    Scheduled Meds: . aspirin EC  81 mg Oral Daily  . dextromethorphan-guaiFENesin  1 tablet Oral BID  . enoxaparin (LOVENOX) injection  40 mg Subcutaneous BID  . insulin aspart  0-24 Units Subcutaneous Q4H  . ipratropium-albuterol  3 mL Nebulization Once  . levothyroxine  75 mcg Oral Daily  . pantoprazole  40 mg Oral Daily  . potassium chloride  40 mEq Oral Daily  . pravastatin  20 mg Oral q1800  . sodium chloride flush  3 mL Intravenous Q12H  . spironolactone  25 mg Oral Daily  . torsemide  40 mg Oral BID   Continuous Infusions: . sodium chloride Stopped (12/14/19 0957)   PRN Meds: sodium chloride, acetaminophen, dextrose, docusate sodium, ipratropium-albuterol, ondansetron (ZOFRAN) IV, polyethylene glycol, sodium chloride, sodium chloride flush   Vital Signs    Vitals:   12/14/19 2337 12/15/19 0346 12/15/19 0500 12/15/19 0814  BP: 129/60 (!) 119/47  135/61  Pulse: 68 75  72  Resp: 18 18  17   Temp: 98 F (36.7 C) (!) 97.4 F (36.3 C)  98 F (36.7 C)  TempSrc:  Oral  Oral  SpO2: 95% 95%  96%  Weight:   90.9 kg   Height:        Intake/Output Summary (Last 24 hours) at 12/15/2019 0853 Last data filed at 12/15/2019 0420 Gross per 24 hour  Intake 775.6 ml  Output 1000 ml  Net -224.4 ml   Last 3 Weights 12/15/2019 12/14/2019 12/13/2019  Weight (lbs) 200 lb 6.4 oz 202 lb 6.1 oz 200 lb 13.4 oz  Weight (kg) 90.9 kg 91.8 kg 91.1 kg      Telemetry    NSR with PVC's - Personally Reviewed  ECG    No new tracings.  Physical Exam   GEN: No acute distress.   Neck: Unable to assess JVP due to patient positioning and difficulty moving neck due to  arthritis. Cardiac: RRR, no murmurs, rubs, or gallops.  Respiratory: Mildly diminished throughout. GI: Soft, nontender, non-distended  MS: No edema; No deformity. Neuro:  Nonfocal  Psych: Normal affect   Labs    High Sensitivity Troponin:   Recent Labs  Lab 12/01/19 1257 12/08/19 1036 12/08/19 1230  TROPONINIHS 13 36* 39*      Chemistry Recent Labs  Lab 12/08/19 1036 12/09/19 0400 12/13/19 0602 12/14/19 0600 12/15/19 0524  NA 133*   < > 140 141 140  K 4.4   < > 4.0 3.7 3.6  CL 83*   < > 87* 87* 88*  CO2 40*   < > 41* 41* 42*  GLUCOSE 122*   < > 140* 142* 158*  BUN 41*   < > 32* 34* 37*  CREATININE 1.16*   < > 1.10* 1.18* 1.10*  CALCIUM 9.2   < > 9.1 9.1 9.2  PROT 6.5  --   --   --   --   ALBUMIN 3.4*  --   --   --   --   AST 16  --   --   --   --   ALT 13  --   --   --   --  ALKPHOS 50  --   --   --   --   BILITOT 0.6  --   --   --   --   GFRNONAA 45*   < > 48* 44* 48*  GFRAA 52*   < > 55* 51* 55*  ANIONGAP 10   < > 12 13 10    < > = values in this interval not displayed.     Hematology Recent Labs  Lab 12/10/19 0431 12/11/19 0526 12/15/19 0524  WBC 11.1* 8.4 9.6  RBC 4.55 4.40 4.86  HGB 12.0 11.4* 12.4  HCT 39.1 37.7 40.8  MCV 85.9 85.7 84.0  MCH 26.4 25.9* 25.5*  MCHC 30.7 30.2 30.4  RDW 15.4 15.4 15.5  PLT 427* 377 325    BNP Recent Labs  Lab 12/08/19 1036  BNP 395.6*     DDimer No results for input(s): DDIMER in the last 168 hours.   Radiology    CT CHEST WO CONTRAST  Result Date: 12/14/2019 CLINICAL DATA:  Shortness of breath. EXAM: CT CHEST WITHOUT CONTRAST TECHNIQUE: Multidetector CT imaging of the chest was performed following the standard protocol without IV contrast. COMPARISON:  December 08, 2019 FINDINGS: Cardiovascular: There is moderate severity calcification of the aortic arch. Normal heart size. No pericardial effusion. Mediastinum/Nodes: No enlarged mediastinal or axillary lymph nodes. Thyroid gland, trachea, and esophagus  demonstrate no significant findings. Lungs/Pleura: A 2 mm noncalcified lung nodule is seen within the right apex. Stable moderate severity areas of atelectasis and/or infiltrate are seen within the bilateral lung bases. Small bilateral pleural effusions are noted. These are mildly decreased in size when compared to the prior exam. No pneumothorax is identified. Upper Abdomen: No acute abnormality. Musculoskeletal: Moderate to marked severity multilevel degenerative changes seen throughout the thoracic spine. A chronic compression deformity of the T9 vertebral body is again noted. IMPRESSION: 1. Stable moderate severity bibasilar atelectasis and/or infiltrate. 2. Small bilateral pleural effusions, mildly decreased in size when compared to the prior exam. 3. 2 mm noncalcified lung nodule within the right apex. Correlation with 12 month follow-up chest CT is recommended to determine stability. 4. 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 5. Aortic atherosclerosis. Aortic Atherosclerosis (ICD10-I70.0). Electronically Signed   By: Virgina Norfolk M.D.   On: 12/14/2019 15:09    Cardiac Studies   TTE (12/08/19): 1. Left ventricular ejection fraction, by estimation, is 60 to 65%. The  left ventricle has normal function. The left ventricle has no regional  wall motion abnormalities. There is mild left ventricular hypertrophy.  Left ventricular diastolic parameters  are consistent with Grade II diastolic dysfunction (pseudonormalization).  Elevated left atrial pressure.  2. Pulmonary artery pressure is mildly to moderately elevated (RVSP 35  mmHg plus central venous pressure). Right ventricular systolic function is  normal. The right ventricular size is mildly enlarged. Mildly increased  right ventricular wall thickness.  3. The mitral valve is degenerative. Trivial mitral valve regurgitation.  No evidence of mitral stenosis.  4. Tricuspid valve regurgitation is mild to moderate.  5. The  aortic valve was not well visualized. Aortic valve regurgitation  is not visualized. No aortic stenosis is present.   Patient Profile     79 y.o. female with h/o hypertension, hyperlipidemia, diabetes, obesity, and osteoarthritis, admitted with acute on chronic HFpEF.  Assessment & Plan    Acute on chronic HFpEF and acute respiratory failure with hypoxia: Patient is grossly euvolemic on exam today.  She was net negative 0.2 L yesterday  but weight is down 2 pounds since yesterday.  BUN/creatinine stable.  CT chest with bibasilar atelectasis versus infiltrates and small pleural effusions.  Transition from furosemide to torsemide 40 mg BID.  Continue spironolactone.  Incentive spirometry.  Wean supplemental oxygen, as tolerated.  Demand ischemia: No angina reported.  Consider outpatient stress test; no plans for inpatient ischemia evaluation.  For questions or updates, please contact [place] Please consult [email or web address] for contact info under [facility] Cardiology.     Signed, Nelva Bush, MD  12/15/2019, 8:53 AM

## 2019-12-16 DIAGNOSIS — L8990 Pressure ulcer of unspecified site, unspecified stage: Secondary | ICD-10-CM | POA: Diagnosis not present

## 2019-12-16 DIAGNOSIS — R0689 Other abnormalities of breathing: Secondary | ICD-10-CM | POA: Diagnosis present

## 2019-12-16 LAB — BASIC METABOLIC PANEL
Anion gap: 14 (ref 5–15)
BUN: 35 mg/dL — ABNORMAL HIGH (ref 8–23)
CO2: 36 mmol/L — ABNORMAL HIGH (ref 22–32)
Calcium: 9.2 mg/dL (ref 8.9–10.3)
Chloride: 89 mmol/L — ABNORMAL LOW (ref 98–111)
Creatinine, Ser: 1.17 mg/dL — ABNORMAL HIGH (ref 0.44–1.00)
GFR calc Af Amer: 51 mL/min — ABNORMAL LOW (ref >60)
GFR calc non Af Amer: 44 mL/min — ABNORMAL LOW (ref >60)
Glucose, Bld: 191 mg/dL — ABNORMAL HIGH (ref 70–99)
Potassium: 3.7 mmol/L (ref 3.5–5.1)
Sodium: 139 mmol/L (ref 135–145)

## 2019-12-16 LAB — GLUCOSE, CAPILLARY
Glucose-Capillary: 178 mg/dL — ABNORMAL HIGH (ref 70–99)
Glucose-Capillary: 164 mg/dL — ABNORMAL HIGH (ref 70–99)
Glucose-Capillary: 234 mg/dL — ABNORMAL HIGH (ref 70–99)
Glucose-Capillary: 148 mg/dL — ABNORMAL HIGH (ref 70–99)

## 2019-12-16 LAB — MAGNESIUM: Magnesium: 1.6 mg/dL — ABNORMAL LOW (ref 1.7–2.4)

## 2019-12-16 MED ORDER — INSULIN DETEMIR 100 UNIT/ML ~~LOC~~ SOLN: 10.0000 [IU] | Freq: Two times a day (BID) | SUBCUTANEOUS | 1 refills | Status: DC

## 2019-12-16 MED ORDER — SPIRONOLACTONE 25 MG PO TABS: 25.0000 mg | ORAL_TABLET | Freq: Every day | ORAL | 0 refills | Status: DC

## 2019-12-16 MED ORDER — TORSEMIDE 20 MG PO TABS: 40.0000 mg | ORAL_TABLET | Freq: Two times a day (BID) | ORAL | 0 refills | Status: DC

## 2019-12-16 MED ORDER — POTASSIUM CHLORIDE CRYS ER 20 MEQ PO TBCR: 40.0000 meq | EXTENDED_RELEASE_TABLET | Freq: Every day | ORAL | 0 refills | Status: DC

## 2019-12-16 MED ORDER — MAGNESIUM SULFATE 2 GM/50ML IV SOLN
2.0000 g | Freq: Once | INTRAVENOUS | Status: AC
  Administered 2019-12-16: 2 g via INTRAVENOUS
  Filled 2019-12-16: qty 50

## 2019-12-16 NOTE — Progress Notes (Signed)
SATURATION QUALIFICATIONS: (This note is used to comply with regulatory documentation for home oxygen)  Patient Saturations on Room Air at Rest = 90 %  Patient Saturations on Room Air while Ambulating = 88 %  Patient Saturations on 2 Liters of oxygen while Ambulating = 94 %  Please briefly explain why patient needs home oxygen: Patient does need oxygen because her saturation drops to 88% without O2.

## 2019-12-16 NOTE — TOC Transition Note (Signed)
Transition of Care [facility]) - CM/SW Discharge Note   Patient Details  Name: Tammie [name] MRN: [id number] Date of Birth: [birth year]  [facility] CM/SW Contact:  Shelbie Hutching, RN Phone Number: 12/16/2019, 3:26 PM   Clinical Narrative:     Patient has been medically cleared for discharge home with home health services.  Floydene Flock with [facility] is aware of patient discharge home today.  Patient will be discharging to her son's home.  Oxygen has been ordered for home use.  If patient qualifies for O2 then it will be delivered to the patient's room by [facility] before she leaves the hospital.   Final next level of care: [place] Barriers to Discharge: Barriers Resolved   Patient Goals and CMS Choice Patient states their goals for this hospitalization and ongoing recovery are:: To get well CMS Medicare.gov Compare Post Acute Care list provided to:: Patient Choice offered to / list presented to : Patient  Discharge Placement                       Discharge Plan and Services   Discharge Planning Services: CM Consult Post Acute Care Choice: Home Health          DME Arranged: Oxygen DME Agency: [facility] Date DME Agency Contacted: 12/16/19 Time DME Agency Contacted: [id number] Representative spoke with at DME Agency: [place]: PT, OT [place] Agency: [place] ([place]) Date [place]: 12/16/19 Time [place]: 1526 Representative spoke with at [place]: [place] (SDOH) Interventions     Readmission Risk Interventions No flowsheet data found.

## 2019-12-16 NOTE — Plan of Care (Signed)

## 2019-12-16 NOTE — Progress Notes (Signed)
Progress Note  Patient Name: Tammie [name] Date of Encounter: 12/16/2019  Primary Cardiologist:Dr. End  Subjective   No CP, racing HR, palpitations. Reports breathing better and feels back at baseline. States off of oxygen since 9AM. States her urine has slowed down.   Ambulated to restroom with walker and without CP or DOE.   She reports improvement in stiff toes, which she attributed today to arthritis, as it has been going on for years.   Eager to go home. States someone told her she will be going home today and wonders if that will happen today as she has not received an update on it.  Inpatient Medications    Scheduled Meds: . aspirin EC  81 mg Oral Daily  . enoxaparin (LOVENOX) injection  40 mg Subcutaneous BID  . hydrocerin   Topical BID  . insulin aspart  0-24 Units Subcutaneous Q4H  . ipratropium-albuterol  3 mL Nebulization Once  . levothyroxine  75 mcg Oral Daily  . pantoprazole  40 mg Oral Daily  . potassium chloride  40 mEq Oral Daily  . pravastatin  20 mg Oral q1800  . sodium chloride flush  3 mL Intravenous Q12H  . spironolactone  25 mg Oral Daily  . torsemide  40 mg Oral BID   Continuous Infusions: . sodium chloride Stopped (12/14/19 0957)   PRN Meds: sodium chloride, acetaminophen, dextrose, docusate sodium, ipratropium-albuterol, ondansetron (ZOFRAN) IV, polyethylene glycol, sodium chloride, sodium chloride flush   Vital Signs    Vitals:   12/15/19 1947 12/15/19 2336 12/16/19 0756 12/16/19 1143  BP: (!) 110/53 (!) 123/54 (!) 140/59 123/82  Pulse: 75 74 74 79  Resp: 20 16 16 17   Temp: (!) 97.5 F (36.4 C) 98.3 F (36.8 C) 97.8 F (36.6 C) 97.9 F (36.6 C)  TempSrc: Oral Oral  Oral  SpO2: 93% 93% 94% 93%  Weight:      Height:        Intake/Output Summary (Last 24 hours) at 12/16/2019 1305 Last data filed at 12/16/2019 0900 Gross per 24 hour  Intake 960 ml  Output 500 ml  Net 460 ml   Last 3 Weights 12/15/2019 12/14/2019 12/13/2019    Weight (lbs) 200 lb 6.4 oz 202 lb 6.1 oz 200 lb 13.4 oz  Weight (kg) 90.9 kg 91.8 kg 91.1 kg      Telemetry    NSR, 80s- Personally Reviewed  ECG    No new tracings - Personally Reviewed  Physical Exam   GEN: No acute distress.  Sitting in chair Neck: JVD not assessed due to patient position and neck arthritis Cardiac: RRR, no murmurs, rubs, or gallops.  Respiratory: Reduced breath sounds but otherwise clear to auscultation bilaterally. GI: Soft, nontender, non-distended  MS: Mild bilateral lower extremity edema; No deformity. Neuro:  Nonfocal  Psych: Normal affect   Labs    High Sensitivity Troponin:   Recent Labs  Lab 12/01/19 1257 12/08/19 1036 12/08/19 1230  TROPONINIHS 13 36* 39*      Chemistry Recent Labs  Lab 12/14/19 0600 12/15/19 0524 12/16/19 0444  NA 141 140 139  K 3.7 3.6 3.7  CL 87* 88* 89*  CO2 41* 42* 36*  GLUCOSE 142* 158* 191*  BUN 34* 37* 35*  CREATININE 1.18* 1.10* 1.17*  CALCIUM 9.1 9.2 9.2  GFRNONAA 44* 48* 44*  GFRAA 51* 55* 51*  ANIONGAP 13 10 14      Hematology Recent Labs  Lab 12/10/19 0431 12/11/19 0526 12/15/19 0524  WBC 11.1* 8.4 9.6  RBC 4.55 4.40 4.86  HGB 12.0 11.4* 12.4  HCT 39.1 37.7 40.8  MCV 85.9 85.7 84.0  MCH 26.4 25.9* 25.5*  MCHC 30.7 30.2 30.4  RDW 15.4 15.4 15.5  PLT 427* 377 325    BNPNo results for input(s): BNP, PROBNP in the last 168 hours.   DDimer No results for input(s): DDIMER in the last 168 hours.   Radiology    CT CHEST WO CONTRAST  Result Date: 12/14/2019 CLINICAL DATA:  Shortness of breath. EXAM: CT CHEST WITHOUT CONTRAST TECHNIQUE: Multidetector CT imaging of the chest was performed following the standard protocol without IV contrast. COMPARISON:  December 08, 2019 FINDINGS: Cardiovascular: There is moderate severity calcification of the aortic arch. Normal heart size. No pericardial effusion. Mediastinum/Nodes: No enlarged mediastinal or axillary lymph nodes. Thyroid gland, trachea, and  esophagus demonstrate no significant findings. Lungs/Pleura: A 2 mm noncalcified lung nodule is seen within the right apex. Stable moderate severity areas of atelectasis and/or infiltrate are seen within the bilateral lung bases. Small bilateral pleural effusions are noted. These are mildly decreased in size when compared to the prior exam. No pneumothorax is identified. Upper Abdomen: No acute abnormality. Musculoskeletal: Moderate to marked severity multilevel degenerative changes seen throughout the thoracic spine. A chronic compression deformity of the T9 vertebral body is again noted. IMPRESSION: 1. Stable moderate severity bibasilar atelectasis and/or infiltrate. 2. Small bilateral pleural effusions, mildly decreased in size when compared to the prior exam. 3. 2 mm noncalcified lung nodule within the right apex. Correlation with 12 month follow-up chest CT is recommended to determine stability. 4. 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 5. Aortic atherosclerosis. Aortic Atherosclerosis (ICD10-I70.0). Electronically Signed   By: Virgina Norfolk M.D.   On: 12/14/2019 15:09    Cardiac Studies   TTE (12/08/19): 1. Left ventricular ejection fraction, by estimation, is 60 to 65%. The  left ventricle has normal function. The left ventricle has no regional  wall motion abnormalities. There is mild left ventricular hypertrophy.  Left ventricular diastolic parameters  are consistent with Grade II diastolic dysfunction (pseudonormalization).  Elevated left atrial pressure.  2. Pulmonary artery pressure is mildly to moderately elevated (RVSP 35  mmHg plus central venous pressure). Right ventricular systolic function is  normal. The right ventricular size is mildly enlarged. Mildly increased  right ventricular wall thickness.  3. The mitral valve is degenerative. Trivial mitral valve regurgitation.  No evidence of mitral stenosis.  4. Tricuspid valve regurgitation is mild to moderate.   5. The aortic valve was not well visualized. Aortic valve regurgitation  is not visualized. No aortic stenosis is present.   Patient Profile     79 y.o. female with history of HTN, HFpEF, HLD, DM2, obesity, and OA, and seen today for acute on chronic HFpEF.  Assessment & Plan    Acute on Chronic HFpEF with acute respiratory failure with hypoxia --Remains euvolemic with improved sx / breathing as above.  Weaned off of oxygen since 9AM today per patient.Previous CT as above with bibasilar atelectasis vs infiltrate and small pleural effusions.  --6/28 echo as above with EF 60-65%, LVH, G2DD, trivial MR as well as mild to moderately elevated PASP.  --Monitor I/Os, standing wt. Net -460.0 cc over previous 24h. No weights for today with previous wt drop of 1kg.  --Monitor renal function, electrolytes. Cr with slight bump overnight but BUN stable. K+ low at 3.7. Replete with goal 4.0. Mg low at 1.6. Replete  with goal 2.0. --Transitioned to torsemide 40mg  BID. Continue. Previously on lasix.  --Recommend potassium supplementation at discharge.  --Continue spironolactone.  --Will need close follow-up in the office. Recommend recheck of BMET at that time.   Hypokalemia, Hypomagnesemia --As above. K 3.7 and Mg 1.6.  --Replete K with goal 4.0. Mg with goal 2.0.  --Recommend supplementation at discharge and close follow-up outpatient labs.   Demand ischemia --No angina. Consider outpatient stress test. No plans for inpatient ischemic workup at this time.   For questions or updates, please contact [place] Please consult [email or web address] for contact info under        Signed, Arvil Chaco, PA-C  12/16/2019, 1:05 PM

## 2019-12-16 NOTE — Progress Notes (Signed)
IV removed before discharge. Went over discharge instructions. All questions answered, patient stated she understood. Oxygen delivered to room and patient was educated on its use. Patient going home POV with son.

## 2019-12-16 NOTE — Progress Notes (Signed)
Nutrition Brief Follow-Up Note  This RD provided diet education regarding CHF and DM to patient and her son on 6/29. Another RD received repeat consult and met with patient in her room yesterday. Voicemail had been left for patient's daughter-in-law Gabrelle Roca to provide diet education. This RD received call back today from patient's daughter-in-law. Discussed diet education over the phone as she was at work and answered questions daughter-in-law had.   Jacklynn Barnacle, MS, RD, LDN Pager number available on Amion

## 2019-12-16 NOTE — Discharge Summary (Signed)
Physician Discharge Summary  BRAIDEN PRESUTTI ZOX:[id number] DOB: 24-Aug-1940 DOA: 12/08/2019  PCP: Dion Body, MD  Admit date: 12/08/2019 Discharge date: 12/16/2019  Admitted From: home Disposition:  home  Recommendations for Outpatient Follow-up:  1. Follow up with PCP in 1-2 weeks 2. Please obtain BMP/CBC in one week 3. Please follow up with cardiology in 1-2 weeks 4. Please follow up on patient's insulin and glycemic control.  She required far less insulin in the hospital than she was taking at home, with oral medications held.  Oral meds were resumed on discharge, and reduced her Levemir dose to 10 units BID, based on her insulin requirements here in the hospital.  Home Health: PT, OT  Equipment/Devices: oxygen   Discharge Condition: Stable  CODE STATUS: Full  Diet recommendation: Heart Healthy    Discharge Diagnoses: Principal Problem:   Acute on chronic heart failure with preserved ejection fraction (HFpEF) (HCC) Active Problems:   Acute respiratory failure with hypoxia and hypercapnia (HCC)   Elevated troponin   Type 2 diabetes mellitus ([place])   Essential hypertension   Heart failure with preserved ejection fraction ([place])   Demand ischemia ([place])   Pressure injury of skin    Summary of HPI and Hospital Course:  79 y.o. female with history of diabetes mellitus, hypertension, hyperlipidemia, and morbid obesity, presented to the ED on 12/08/19 for 6 week history of progressive dysnpea, lower extremity edema and generalized malaise and fatigue, with 20 lb weight gain in 2 months time despite compliance with her diuretics.  She had been seen by PCP where she was noted to be hypoxic with O2 sat of 71%, and she was sent to the ED.  Initially was admitted to ICU requiring Bipap and Levophed for BP support.  Cardiology consulted.  Patient's hemodynamics and respiratory status improved and hospitalist service assumed care on 12/10/19.    Acute respiratory failure with  hypoxia and hypercarbia - POA, secondary to Acute on chronic HFpEF - presented with hypoxia, new bilateral pleural effusions, pulmonary edema and generalized anasarca.  Was initially admitted to ICU requiring BiPAP and Levophed.  Echo showed normal EF and grade 2 diastolic dysfunction.  Cardiology was consulted.  Improved with IV Lasix.  Transitioned to PO torsemide at discharge.  Started on spironolactone. Dietician was consulted for nutrition education for CHF.  Follow up with Dr. Rockey Situ, [facility] cardiology.     Obtained non-contrast chest CT 7/4  to look for parenchymal lung disease or other cause of her ongoing hypoxia despite diuresis.  Has small bilateral pleural effusions, bibasilar atelectasis vs infiltrate (no respiratory symptoms, no fevers or leukocytosis and effusions were larger at time of admission, so suspect atelectasis).  D-dimer negative, ruled out PE.  Negative procalcitonin.    Hypercarbia - likely multifactorial with thoracic restriction due to obesity, underlying COPD.  Recommend pulmonary follow up for further evaluation.  Trilogy NIV was ordered at discharge.  Type 2 diabetes -Covered with sliding scale NovoLog during admission.  Required much less insulin that at home.  Patient counseled on the need to watch sugars closely and follow up with PCP soon after discharge.  Essential hypertension -chronic, hypotensive on admission requiring Levophed.  Hypotension resolved but diastolic still in the 81X with ongoing diuresis.  Her Coreg HCTZ and lisinopril were discontinued at time of discharge to avoid hypotension, syncope, falls.  Recommended patient monitor BP at home and close PCP follow up.  Hyperlipidemia -continue statin  Morbid obesity: Body mass index is 39.14 kg/m.  Complicates  overall care and prognosis.  Contributes to hypercarbia as above.   Discharge Instructions   Discharge Instructions    (HEART FAILURE PATIENTS) Call MD:  Anytime you have any of the following  symptoms: 1) 3 pound weight gain in 24 hours or 5 pounds in 1 week 2) shortness of breath, with or without a dry hacking cough 3) swelling in the hands, feet or stomach 4) if you have to sleep on extra pillows at night in order to breathe.   Complete by: As directed    Call MD for:  extreme fatigue   Complete by: As directed    Call MD for:  persistant dizziness or light-headedness   Complete by: As directed    Call MD for:  temperature >100.4   Complete by: As directed    Diet - low sodium heart healthy   Complete by: As directed    Increase activity slowly   Complete by: As directed      Allergies as of 12/16/2019      Reactions   Ampicillin Hives   Contrast Media [iodinated Diagnostic Agents] Hives   Penicillin G Hives   Sulfamethoxazole-trimethoprim Hives   Tetracyclines & Related Hives   Verapamil Hives   Amlodipine Rash      Medication List    STOP taking these medications   carvedilol 6.25 MG tablet Commonly known as: COREG   hydrochlorothiazide 25 MG tablet Commonly known as: HYDRODIURIL   lisinopril 40 MG tablet Commonly known as: ZESTRIL     TAKE these medications   aspirin EC 81 MG tablet Take 81 mg by mouth daily.   Calcium 600-400 MG-UNIT Chew Chew 1 tablet by mouth 2 (two) times daily.   glimepiride 2 MG tablet Commonly known as: AMARYL Take 2 mg by mouth daily with breakfast.   insulin detemir 100 UNIT/ML injection Commonly known as: LEVEMIR Inject 0.1 mLs (10 Units total) into the skin 2 (two) times daily. What changed: how much to take   levothyroxine 75 MCG tablet Commonly known as: SYNTHROID Take 75 mcg by mouth daily.   lovastatin 20 MG tablet Commonly known as: MEVACOR Take 20 mg by mouth at bedtime.   metFORMIN 1000 MG tablet Commonly known as: GLUCOPHAGE Take 1,000 mg by mouth 2 (two) times daily.   naproxen sodium 220 MG tablet Commonly known as: ALEVE Take 220 mg by mouth 2 (two) times daily as needed (pain).   omeprazole 20  MG capsule Commonly known as: PRILOSEC Take 20 mg by mouth daily.   potassium chloride SA 20 MEQ tablet Commonly known as: KLOR-CON Take 2 tablets (40 mEq total) by mouth daily. Start taking on: December 17, 2019   spironolactone 25 MG tablet Commonly known as: ALDACTONE Take 1 tablet (25 mg total) by mouth daily. Start taking on: December 17, 2019   torsemide 20 MG tablet Commonly known as: DEMADEX Take 2 tablets (40 mg total) by mouth 2 (two) times daily. What changed:   how much to take  when to take this       Allergies  Allergen Reactions  . Ampicillin Hives  . Contrast Media [Iodinated Diagnostic Agents] Hives  . Penicillin G Hives  . Sulfamethoxazole-Trimethoprim Hives  . Tetracyclines & Related Hives  . Verapamil Hives  . Amlodipine Rash    Consultations:  Cardiology    Procedures/Studies: CT ABDOMEN PELVIS WO CONTRAST  Result Date: 12/09/2019 CLINICAL DATA:  Abdominal pain. Concern for probable liver failure and renal failure associated with severe  hypoglycemia. EXAM: CT ABDOMEN AND PELVIS WITHOUT CONTRAST TECHNIQUE: Multidetector CT imaging of the abdomen and pelvis was performed following the standard protocol without IV contrast. COMPARISON:  None. FINDINGS: Lower chest: There are small to moderate bilateral pleural effusions. Consolidation/atelectasis of the left lower lobe is noted. Hepatobiliary: No focal liver abnormality identified. Calcified stones are identified layering within the dependent portion of the gallbladder. No biliary ductal dilatation identified. Pancreas: Unremarkable. No pancreatic ductal dilatation or surrounding inflammatory changes. Spleen: Normal in size without focal abnormality. Adrenals/Urinary Tract: Normal appearance of the adrenal glands. Bilateral kidney cysts. Incompletely characterized without IV contrast. The largest is exophytic arising from the inferior pole of left kidney measuring 7.6 x 6.5 cm. No nephrolithiasis or  hydronephrosis. The urinary bladder is normal. Stomach/Bowel: Stomach appears normal. There is no bowel wall thickening, inflammation or distension. Colonic diverticulosis noted. The appendix is visualized and appears normal. Vascular/Lymphatic: Aortic atherosclerosis. No aneurysm. No abdominopelvic adenopathy. Reproductive: Status post hysterectomy. No adnexal masses. Other: Trace ascites noted within the abdomen and pelvis. No focal fluid collections. Musculoskeletal: Skin thickening and subcutaneous fat stranding identified throughout the body wall compatible with anasarca. Mild degenerative disc disease within the thoracolumbar spine. Multi level, bilateral facet arthropathy identified within the lumbar spine. Mild bilateral hip osteoarthritis. IMPRESSION: 1. No acute findings identified within the abdomen or pelvis. 2. Bilateral kidney cysts. Incompletely characterized without IV contrast. 3. Gallstones. 4. Small to moderate bilateral pleural effusions with left lower lobe atelectasis/consolidation. 5. Anasarca. 6. Aortic atherosclerosis. Aortic Atherosclerosis (ICD10-I70.0). Electronically Signed   By: Kerby Moors M.D.   On: 12/09/2019 09:31   DG Chest 2 View  Result Date: 12/08/2019 CLINICAL DATA:  Shortness of breath EXAM: CHEST - 2 VIEW COMPARISON:  December 02, 2019 FINDINGS: There is persistent airspace opacity with pleural effusion at the left base. There is a smaller right pleural effusion with ill-defined airspace opacity in the right base. Heart size and pulmonary vascularity are normal. No adenopathy. No bone lesions. IMPRESSION: Pleural effusions bilaterally, larger on the left than on the right ill-defined airspace opacity in the lung bases, more on the left than on the right. Changes on the right are new compared to most recent study. Changes on the left are similar. Stable cardiac silhouette. Electronically Signed   By: Lowella Grip III M.D.   On: 12/08/2019 11:01   DG Chest 2  View  Result Date: 12/02/2019 CLINICAL DATA:  Weight gain, shortness of breath EXAM: CHEST - 2 VIEW COMPARISON:  None. FINDINGS: The heart size and mediastinal contours are within normal limits. Small to moderate left pleural effusion. The visualized skeletal structures are unremarkable. IMPRESSION: Small to moderate left pleural effusion. Electronically Signed   By: Eddie Candle M.D.   On: 12/02/2019 12:56   CT CHEST WO CONTRAST  Result Date: 12/14/2019 CLINICAL DATA:  Shortness of breath. EXAM: CT CHEST WITHOUT CONTRAST TECHNIQUE: Multidetector CT imaging of the chest was performed following the standard protocol without IV contrast. COMPARISON:  December 08, 2019 FINDINGS: Cardiovascular: There is moderate severity calcification of the aortic arch. Normal heart size. No pericardial effusion. Mediastinum/Nodes: No enlarged mediastinal or axillary lymph nodes. Thyroid gland, trachea, and esophagus demonstrate no significant findings. Lungs/Pleura: A 2 mm noncalcified lung nodule is seen within the right apex. Stable moderate severity areas of atelectasis and/or infiltrate are seen within the bilateral lung bases. Small bilateral pleural effusions are noted. These are mildly decreased in size when compared to the prior exam. No pneumothorax is identified.  Upper Abdomen: No acute abnormality. Musculoskeletal: Moderate to marked severity multilevel degenerative changes seen throughout the thoracic spine. A chronic compression deformity of the T9 vertebral body is again noted. IMPRESSION: 1. Stable moderate severity bibasilar atelectasis and/or infiltrate. 2. Small bilateral pleural effusions, mildly decreased in size when compared to the prior exam. 3. 2 mm noncalcified lung nodule within the right apex. Correlation with 12 month follow-up chest CT is recommended to determine stability. 4. 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 5. Aortic atherosclerosis. Aortic Atherosclerosis (ICD10-I70.0).  Electronically Signed   By: Virgina Norfolk M.D.   On: 12/14/2019 15:09   CT Chest Wo Contrast  Result Date: 12/08/2019 CLINICAL DATA:  Follow-up abnormal chest x-ray EXAM: CT CHEST WITHOUT CONTRAST TECHNIQUE: Multidetector CT imaging of the chest was performed following the standard protocol without IV contrast. COMPARISON:  Chest x-ray from earlier in the same day. FINDINGS: Cardiovascular: Somewhat limited due to lack of IV contrast. Aortic calcifications are noted without aneurysmal dilatation. Heart is at the upper limits of normal in size. Coronary calcifications are seen. Mediastinum/Nodes: Thoracic inlet is within normal limits. No hilar or mediastinal adenopathy is noted. Noted the esophagus as visualized is within normal limits. Lungs/Pleura: Bilateral pleural effusions are noted left slightly greater than right with some associated lower lobe atelectatic changes identified again left greater than right. Upper Abdomen: Visualized upper abdomen shows no acute abnormality. Musculoskeletal: Degenerative changes of the thoracic spine are noted. Chronic compression deformity at T9. IMPRESSION: Bilateral pleural effusions left greater than right with associated atelectatic changes. T9 compression deformity which appears chronic with increased sclerosis. No other focal abnormality is noted. Aortic Atherosclerosis (ICD10-I70.0). Electronically Signed   By: Inez Catalina M.D.   On: 12/08/2019 13:10   NM Pulmonary Perfusion  Result Date: 12/02/2019 CLINICAL DATA:  Bilateral leg edema EXAM: NUCLEAR MEDICINE PERFUSION LUNG SCAN TECHNIQUE: Perfusion images were obtained in multiple projections after intravenous injection of radiopharmaceutical. Ventilation scans intentionally deferred if perfusion scan and chest x-ray adequate for interpretation during COVID 19 epidemic. RADIOPHARMACEUTICALS:  4.05 mCi Tc-34m MAA IV COMPARISON:  Same day chest radiograph FINDINGS: Radiographically matched, nonsegmental defect  of the left lung base, in keeping with pleural effusion seen on comparison chest radiograph. No evidence of segmental, wedge-shaped, or other suspicious perfusion defects. IMPRESSION: Radiographically matched, nonsegmental defect of the left lung base, in keeping with pleural effusion seen on comparison chest radiograph. Low probability examination for pulmonary embolism by modified perfusion only PIOPED criteria. Electronically Signed   By: Eddie Candle M.D.   On: 12/02/2019 12:55   US Venous Img Lower Bilateral (DVT)  Result Date: 12/02/2019 CLINICAL DATA:  Bilateral lower extremity edema EXAM: BILATERAL LOWER EXTREMITY VENOUS DOPPLER ULTRASOUND TECHNIQUE: Gray-scale sonography with graded compression, as well as color Doppler and duplex ultrasound were performed to evaluate the lower extremity deep venous systems from the level of the common femoral vein and including the common femoral, femoral, profunda femoral, popliteal and calf veins including the posterior tibial, peroneal and gastrocnemius veins when visible. The superficial great saphenous vein was also interrogated. Spectral Doppler was utilized to evaluate flow at rest and with distal augmentation maneuvers in the common femoral, femoral and popliteal veins. COMPARISON:  None. FINDINGS: RIGHT LOWER EXTREMITY Common Femoral Vein: No evidence of thrombus. Normal compressibility, respiratory phasicity and response to augmentation. Saphenofemoral Junction: No evidence of thrombus. Normal compressibility and flow on color Doppler imaging. Profunda Femoral Vein: No evidence of thrombus. Normal compressibility and flow on color Doppler  imaging. Femoral Vein: No evidence of thrombus. Normal compressibility, respiratory phasicity and response to augmentation. Popliteal Vein: No evidence of thrombus. Normal compressibility, respiratory phasicity and response to augmentation. Calf Veins: No evidence of thrombus. Normal compressibility and flow on color Doppler  imaging. Superficial Great Saphenous Vein: No evidence of thrombus. Normal compressibility. Venous Reflux:  None. Other Findings:  None. LEFT LOWER EXTREMITY Common Femoral Vein: No evidence of thrombus. Normal compressibility, respiratory phasicity and response to augmentation. Saphenofemoral Junction: No evidence of thrombus. Normal compressibility and flow on color Doppler imaging. Profunda Femoral Vein: No evidence of thrombus. Normal compressibility and flow on color Doppler imaging. Femoral Vein: No evidence of thrombus. Normal compressibility, respiratory phasicity and response to augmentation. Popliteal Vein: No evidence of thrombus. Normal compressibility, respiratory phasicity and response to augmentation. Calf Veins: No evidence of thrombus. Normal compressibility and flow on color Doppler imaging. Superficial Great Saphenous Vein: No evidence of thrombus. Normal compressibility. Venous Reflux:  None. Other Findings:  None. IMPRESSION: No evidence of deep venous thrombosis in either lower extremity. Electronically Signed   By: Davina Poke D.O.   On: 12/02/2019 13:00   DG Chest Port 1 View  Result Date: 12/11/2019 CLINICAL DATA:  Pleural effusion. EXAM: PORTABLE CHEST 1 VIEW COMPARISON:  CT 12/08/2019.  Chest x-ray 12/08/2019. FINDINGS: Mediastinum and hilar structures are normal. Cardiomegaly. Diffuse bilateral pulmonary infiltrates/edema noted on today's exam. Persistent bilateral pleural effusions. Findings most suggest CHF. Bilateral pneumonia cannot be excluded. No pneumothorax. IMPRESSION: Cardiomegaly. Diffuse bilateral pulmonary infiltrates/edema noted on today's exam. Persistent bilateral pleural effusions. Findings most consistent with CHF. Electronically Signed   By: Marcello Moores  Register   On: 12/11/2019 04:52   ECHOCARDIOGRAM COMPLETE  Result Date: 12/09/2019    ECHOCARDIOGRAM REPORT   Patient Name:   SUELLYN MEENAN Beaulieu Date of Exam: 12/08/2019 Medical Rec #:  [id number]              Height:       60.0 in Accession #:    [id number]            Weight:       221.0 lb Date of Birth:  12-16-1940             BSA:          1.948 m Patient Age:    79 years              BP:           131/72 mmHg Patient Gender: F                     HR:           58 bpm. Exam Location:  [facility] Procedure: 2D Echo, Cardiac Doppler and Color Doppler Indications:     Acute Respiratory Insufficiency 518.82 / R06.89  History:         Patient has no prior history of Echocardiogram examinations.                  Risk Factors:Hypertension and Diabetes.  Sonographer:     Alyse Low Roar Referring Phys:  [id number] Flora Lipps Diagnosing Phys: Nelva Bush MD IMPRESSIONS  1. Left ventricular ejection fraction, by estimation, is 60 to 65%. The left ventricle has normal function. The left ventricle has no regional wall motion abnormalities. There is mild left ventricular hypertrophy. Left ventricular diastolic parameters are consistent with Grade II diastolic dysfunction (pseudonormalization). Elevated left atrial pressure.  2. Pulmonary artery pressure is  mildly to moderately elevated (RVSP 35 mmHg plus central venous pressure). Right ventricular systolic function is normal. The right ventricular size is mildly enlarged. Mildly increased right ventricular wall thickness.  3. The mitral valve is degenerative. Trivial mitral valve regurgitation. No evidence of mitral stenosis.  4. Tricuspid valve regurgitation is mild to moderate.  5. The aortic valve was not well visualized. Aortic valve regurgitation is not visualized. No aortic stenosis is present. FINDINGS  Left Ventricle: Left ventricular ejection fraction, by estimation, is 60 to 65%. The left ventricle has normal function. The left ventricle has no regional wall motion abnormalities. The left ventricular internal cavity size was normal in size. There is  mild left ventricular hypertrophy. Left ventricular diastolic parameters are consistent with Grade II diastolic dysfunction  (pseudonormalization). Elevated left atrial pressure. Right Ventricle: Pulmonary artery pressure is mildly to moderately elevated (RVSP 35 mmHg plus central venous pressure). The right ventricular size is mildly enlarged. Mildly increased right ventricular wall thickness. Right ventricular systolic function  is normal. Left Atrium: Left atrial size was normal in size. Right Atrium: Right atrial size was normal in size. Pericardium: The pericardium was not well visualized. Mitral Valve: The mitral valve is degenerative in appearance. Mild mitral annular calcification. Trivial mitral valve regurgitation. No evidence of mitral valve stenosis. Tricuspid Valve: The tricuspid valve is not well visualized. Tricuspid valve regurgitation is mild to moderate. Aortic Valve: The aortic valve was not well visualized. Aortic valve regurgitation is not visualized. No aortic stenosis is present. Aortic valve mean gradient measures 5.0 mmHg. Aortic valve peak gradient measures 9.6 mmHg. Aortic valve area, by VTI measures 1.99 cm. Pulmonic Valve: The pulmonic valve was not well visualized. Pulmonic valve regurgitation is not visualized. No evidence of pulmonic stenosis. Aorta: The aortic root is normal in size and structure. Pulmonary Artery: The pulmonary artery is not well seen. Venous: The inferior vena cava was not well visualized. IAS/Shunts: The interatrial septum was not well visualized. Additional Comments: There is pleural effusion in the left lateral region.  LEFT VENTRICLE PLAX 2D LVIDd:         4.53 cm  Diastology LVIDs:         3.17 cm  LV e' lateral:   8.92 cm/s LV PW:         1.11 cm  LV E/e' lateral: 11.4 LV IVS:        1.16 cm  LV e' medial:    5.98 cm/s LVOT diam:     1.80 cm  LV E/e' medial:  17.1 LV SV:         67 LV SV Index:   34 LVOT Area:     2.54 cm  RIGHT VENTRICLE RV Mid diam:    4.30 cm RV S prime:     13.90 cm/s TAPSE (M-mode): 2.9 cm LEFT ATRIUM             Index       RIGHT ATRIUM           Index LA  diam:        4.00 cm 2.05 cm/m  RA Area:     16.70 cm LA Vol (A2C):   69.3 ml 35.58 ml/m RA Volume:   44.50 ml  22.85 ml/m LA Vol (A4C):   51.2 ml 26.29 ml/m LA Biplane Vol: 60.2 ml 30.91 ml/m  AORTIC VALVE                    PULMONIC VALVE  AV Area (Vmax):    2.15 cm     PV Vmax:       1.02 m/s AV Area (Vmean):   1.89 cm     PV Peak grad:  4.2 mmHg AV Area (VTI):     1.99 cm AV Vmax:           155.00 cm/s AV Vmean:          102.000 cm/s AV VTI:            0.338 m AV Peak Grad:      9.6 mmHg AV Mean Grad:      5.0 mmHg LVOT Vmax:         131.00 cm/s LVOT Vmean:        75.900 cm/s LVOT VTI:          0.264 m LVOT/AV VTI ratio: 0.78  AORTA Ao Root diam: 2.70 cm MITRAL VALVE                TRICUSPID VALVE MV Area (PHT): 3.89 cm     TR Peak grad:   34.8 mmHg MV Decel Time: 195 msec     TR Vmax:        295.00 cm/s MV E velocity: 102.00 cm/s MV A velocity: 86.10 cm/s   SHUNTS MV E/A ratio:  1.18         Systemic VTI:  0.26 m MV A Prime:    10.9 cm/s    Systemic Diam: 1.80 cm Nelva Bush MD Electronically signed by Nelva Bush MD Signature Date/Time: 12/09/2019/10:09:34 AM    Final        Subjective: Pt seen up in chair today.  Says she is ready to go home.  Denies any SOB at rest or with exertion.   Says her toes feel little less tight.  No cough, fever, chills, or other complaints.   Discharge Exam: Vitals:   12/16/19 0756 12/16/19 1143  BP: (!) 140/59 123/82  Pulse: 74 79  Resp: 16 17  Temp: 97.8 F (36.6 C) 97.9 F (36.6 C)  SpO2: 94% 93%   Vitals:   12/15/19 1947 12/15/19 2336 12/16/19 0756 12/16/19 1143  BP: (!) 110/53 (!) 123/54 (!) 140/59 123/82  Pulse: 75 74 74 79  Resp: 20 16 16 17   Temp: (!) 97.5 F (36.4 C) 98.3 F (36.8 C) 97.8 F (36.6 C) 97.9 F (36.6 C)  TempSrc: Oral Oral  Oral  SpO2: 93% 93% 94% 93%  Weight:      Height:        General: Pt is alert, awake, not in acute distress Cardiovascular: RRR, S1/S2 +, no rubs, no gallops Respiratory: diminished  bases otherwise CTA bilaterally, no wheezing, no rhonchi Abdominal: Soft, NT, ND, bowel sounds + Extremities: no edema, no cyanosis    The results of significant diagnostics from this hospitalization (including imaging, microbiology, ancillary and laboratory) are listed below for reference.     Microbiology: Recent Results (from the past 240 hour(s))  SARS Coronavirus 2 by RT PCR (hospital order, performed in [facility] hospital lab) Nasopharyngeal Nasopharyngeal Swab     Status: None   Collection Time: 12/08/19 11:25 AM   Specimen: Nasopharyngeal Swab  Result Value Ref Range Status   SARS Coronavirus 2 NEGATIVE NEGATIVE Final    Comment: (NOTE) SARS-CoV-2 target nucleic acids are NOT DETECTED.  The SARS-CoV-2 RNA is generally detectable in upper and lower respiratory specimens during the acute phase of infection. The lowest concentration of  SARS-CoV-2 viral copies this assay can detect is 250 copies / mL. A negative result does not preclude SARS-CoV-2 infection and should not be used as the sole basis for treatment or other patient management decisions.  A negative result may occur with improper specimen collection / handling, submission of specimen other than nasopharyngeal swab, presence of viral mutation(s) within the areas targeted by this assay, and inadequate number of viral copies (<250 copies / mL). A negative result must be combined with clinical observations, patient history, and epidemiological information.  Fact Sheet for Patients:   StrictlyIdeas.no  Fact Sheet for Healthcare Providers: BankingDealers.co.za  This test is not yet approved or  cleared by the [place] FDA and has been authorized for detection and/or diagnosis of SARS-CoV-2 by FDA under an Emergency Use Authorization (EUA).  This EUA will remain in effect (meaning this test can be used) for the duration of the COVID-19 declaration under Section  564(b)(1) of the Act, 21 U.S.C. section 360bbb-3(b)(1), unless the authorization is terminated or revoked sooner.  Performed at [facility], [address], [place], [place] [address]   Urine culture     Status: None   Collection Time: 12/08/19 11:28 AM   Specimen: Urine, Random  Result Value Ref Range Status   Specimen Description   Final    URINE, RANDOM Performed at [facility], [address], [place], [place] [address]    Special Requests   Final    NONE Performed at [facility], [address] [place], [place] [address]    Culture   Final    NO GROWTH Performed at [facility], [place] [address] [place], [place] [address]    Report Status 12/09/2019 FINAL  Final  MRSA PCR Screening     Status: None   Collection Time: 12/08/19  4:37 PM   Specimen: Nasopharyngeal  Result Value Ref Range Status   MRSA by PCR NEGATIVE NEGATIVE Final    Comment:        The GeneXpert MRSA Assay (FDA approved for NASAL specimens only), is one component of a comprehensive MRSA colonization surveillance program. It is not intended to diagnose MRSA infection nor to guide or monitor treatment for MRSA infections. Performed at [facility], [address] [place], [place] [address]      Labs: BNP (last 3 results) Recent Labs    12/01/19 1253 12/08/19 1036  BNP 139.2* 267.1*   Basic Metabolic Panel: Recent Labs  Lab 12/10/19 0431 12/10/19 0431 12/11/19 0526 12/11/19 0526 12/12/19 0552 12/13/19 0602 12/14/19 0600 12/15/19 0524 12/16/19 0444  NA 139   < > 140   < > 140 140 141 140 139  K 4.0   < > 4.1   < > 4.0 4.0 3.7 3.6 3.7  CL 86*   < > 90*   < > 88* 87* 87* 88* 89*  CO2 42*   < > 40*   < > 42* 41* 41* 42* 36*  GLUCOSE 123*   < > 148*   < > 127* 140* 142* 158* 191*  BUN 43*   < > 34*   < > 29* 32* 34* 37* 35*  CREATININE 1.27*   < > 1.02*   < > 1.06* 1.10* 1.18* 1.10* 1.17*  CALCIUM 8.9   < > 8.9   < > 9.0 9.1 9.1 9.2  9.2  MG 1.8   < > 1.7   < > 1.8 1.7 1.7 1.8 1.6*  PHOS 4.9*  --  3.5  --   --   --   --   --   --    < > = values in this interval not displayed.   Liver Function Tests: No results for input(s): AST, ALT, ALKPHOS, BILITOT, PROT, ALBUMIN in the last 168 hours. No results for input(s): LIPASE, AMYLASE in the last 168 hours. No results for input(s): AMMONIA in the last 168 hours. CBC: Recent Labs  Lab 12/10/19 0431 12/11/19 0526 12/15/19 0524  WBC 11.1* 8.4 9.6  NEUTROABS 8.0* 6.0 6.9  HGB 12.0 11.4* 12.4  HCT 39.1 37.7 40.8  MCV 85.9 85.7 84.0  PLT 427* 377 325   Cardiac Enzymes: No results for input(s): CKTOTAL, CKMB, CKMBINDEX, TROPONINI in the last 168 hours. BNP: Invalid input(s): POCBNP CBG: Recent Labs  Lab 12/15/19 1945 12/15/19 2334 12/16/19 0540 12/16/19 0821 12/16/19 1144  GLUCAP 192* 160* 178* 164* 234*   D-Dimer No results for input(s): DDIMER in the last 72 hours. Hgb A1c No results for input(s): HGBA1C in the last 72 hours. Lipid Profile No results for input(s): CHOL, HDL, LDLCALC, TRIG, CHOLHDL, LDLDIRECT in the last 72 hours. Thyroid function studies No results for input(s): TSH, T4TOTAL, T3FREE, THYROIDAB in the last 72 hours.  Invalid input(s): FREET3 Anemia work up No results for input(s): VITAMINB12, FOLATE, FERRITIN, TIBC, IRON, RETICCTPCT in the last 72 hours. Urinalysis No results found for: COLORURINE, APPEARANCEUR, [place], [place], [place], [place], [place], [place], PROTEINUR, UROBILINOGEN, NITRITE, LEUKOCYTESUR Sepsis Labs Invalid input(s): PROCALCITONIN,  WBC,  LACTICIDVEN Microbiology Recent Results (from the past 240 hour(s))  SARS Coronavirus 2 by RT PCR (hospital order, performed in [facility] hospital lab) Nasopharyngeal Nasopharyngeal Swab     Status: None   Collection Time: 12/08/19 11:25 AM   Specimen: Nasopharyngeal Swab  Result Value Ref Range Status   SARS Coronavirus 2 NEGATIVE NEGATIVE Final    Comment:  (NOTE) SARS-CoV-2 target nucleic acids are NOT DETECTED.  The SARS-CoV-2 RNA is generally detectable in upper and lower respiratory specimens during the acute phase of infection. The lowest concentration of SARS-CoV-2 viral copies this assay can detect is 250 copies / mL. A negative result does not preclude SARS-CoV-2 infection and should not be used as the sole basis for treatment or other patient management decisions.  A negative result may occur with improper specimen collection / handling, submission of specimen other than nasopharyngeal swab, presence of viral mutation(s) within the areas targeted by this assay, and inadequate number of viral copies (<250 copies / mL). A negative result must be combined with clinical observations, patient history, and epidemiological information.  Fact Sheet for Patients:   StrictlyIdeas.no  Fact Sheet for Healthcare Providers: BankingDealers.co.za  This test is not yet approved or  cleared by the [place] FDA and has been authorized for detection and/or diagnosis of SARS-CoV-2 by FDA under an Emergency Use Authorization (EUA).  This EUA will remain in effect (meaning this test can be used) for the duration of the COVID-19 declaration under Section 564(b)(1) of the Act, 21 U.S.C. section 360bbb-3(b)(1), unless the authorization is terminated or revoked sooner.  Performed at [facility], [address] [place], [place] [address]   Urine culture     Status: None   Collection Time: 12/08/19 11:28 AM   Specimen: Urine, Random  Result Value Ref Range Status   Specimen Description   Final    URINE, RANDOM Performed at [facility], [place], [place]  [address]    Special Requests   Final    NONE Performed at [facility], [address] [place], [place] [address]    Culture   Final    NO GROWTH Performed at [facility], [place]  [address], [place], [place] [address]    Report Status 12/09/2019 FINAL  Final  MRSA PCR Screening     Status: None   Collection Time: 12/08/19  4:37 PM   Specimen: Nasopharyngeal  Result Value Ref Range Status   MRSA by PCR NEGATIVE NEGATIVE Final    Comment:        The GeneXpert MRSA Assay (FDA approved for NASAL specimens only), is one component of a comprehensive MRSA colonization surveillance program. It is not intended to diagnose MRSA infection nor to guide or monitor treatment for MRSA infections. Performed at [facility], [address] [place], [place] [address]      Time coordinating discharge: Over 30 minutes  SIGNED:   Ezekiel Slocumb, DO [facility] 12/16/2019, 2:59 PM   If 7PM-7AM, please contact night-coverage [email or web address]

## 2019-12-17 LAB — BLOOD GAS, VENOUS
Acid-Base Excess: 20.5 mmol/L — ABNORMAL HIGH (ref 0.0–2.0)
Bicarbonate: 51.9 mmol/L — ABNORMAL HIGH (ref 20.0–28.0)
O2 Saturation: 50.7 %
Patient temperature: 37
pCO2, Ven: 103 mmHg (ref 44.0–60.0)
pH, Ven: 7.31 (ref 7.250–7.430)

## 2019-12-18 ENCOUNTER — Telehealth: Payer: Self-pay | Admitting: Internal Medicine

## 2019-12-18 NOTE — Telephone Encounter (Signed)
-----   Message from Nelva Bush, MD sent at 12/16/2019  2:42 PM EDT ----- Regarding: Hospital f/u Patient being d/c'ed later today.  Could you reach out to her tomorrow (so that she can speak with her family re: transportation) to schedule a f/u with me or APP in 1-2 weeks?  Thanks.  Gerald Stabs

## 2019-12-18 NOTE — Telephone Encounter (Signed)
Attempted to schedule No answer, No VM

## 2019-12-18 NOTE — Telephone Encounter (Signed)
Attempted to schedule no ans no vm  

## 2019-12-22 ENCOUNTER — Encounter: Payer: Self-pay | Admitting: Emergency Medicine

## 2019-12-22 ENCOUNTER — Emergency Department: Payer: Medicare HMO

## 2019-12-22 ENCOUNTER — Inpatient Hospital Stay
Admission: EM | Admit: 2019-12-22 | Discharge: 2019-12-25 | DRG: 378 | Disposition: A | Discharge status: Home Health | Payer: Medicare HMO | Attending: Internal Medicine | Admitting: Internal Medicine

## 2019-12-22 ENCOUNTER — Other Ambulatory Visit: Payer: Self-pay

## 2019-12-22 DIAGNOSIS — Z79899 Other long term (current) drug therapy: Secondary | ICD-10-CM

## 2019-12-22 DIAGNOSIS — Z803 Family history of malignant neoplasm of breast: Secondary | ICD-10-CM

## 2019-12-22 DIAGNOSIS — Z20822 Contact with and (suspected) exposure to covid-19: Secondary | ICD-10-CM | POA: Diagnosis present

## 2019-12-22 DIAGNOSIS — Z880 Allergy status to penicillin: Secondary | ICD-10-CM

## 2019-12-22 DIAGNOSIS — N179 Acute kidney failure, unspecified: Secondary | ICD-10-CM | POA: Diagnosis present

## 2019-12-22 DIAGNOSIS — Z79890 Hormone replacement therapy (postmenopausal): Secondary | ICD-10-CM | POA: Diagnosis not present

## 2019-12-22 DIAGNOSIS — Z882 Allergy status to sulfonamides status: Secondary | ICD-10-CM | POA: Diagnosis not present

## 2019-12-22 DIAGNOSIS — Z66 Do not resuscitate: Secondary | ICD-10-CM | POA: Diagnosis present

## 2019-12-22 DIAGNOSIS — E869 Volume depletion, unspecified: Secondary | ICD-10-CM | POA: Diagnosis present

## 2019-12-22 DIAGNOSIS — D62 Acute posthemorrhagic anemia: Secondary | ICD-10-CM | POA: Diagnosis present

## 2019-12-22 DIAGNOSIS — K921 Melena: Secondary | ICD-10-CM | POA: Diagnosis present

## 2019-12-22 DIAGNOSIS — I5032 Chronic diastolic (congestive) heart failure: Secondary | ICD-10-CM | POA: Diagnosis present

## 2019-12-22 DIAGNOSIS — I9581 Postprocedural hypotension: Secondary | ICD-10-CM | POA: Diagnosis not present

## 2019-12-22 DIAGNOSIS — I10 Essential (primary) hypertension: Secondary | ICD-10-CM | POA: Diagnosis present

## 2019-12-22 DIAGNOSIS — Z85828 Personal history of other malignant neoplasm of skin: Secondary | ICD-10-CM | POA: Diagnosis not present

## 2019-12-22 DIAGNOSIS — Z888 Allergy status to other drugs, medicaments and biological substances status: Secondary | ICD-10-CM

## 2019-12-22 DIAGNOSIS — Z794 Long term (current) use of insulin: Secondary | ICD-10-CM | POA: Diagnosis not present

## 2019-12-22 DIAGNOSIS — K254 Chronic or unspecified gastric ulcer with hemorrhage: Principal | ICD-10-CM | POA: Diagnosis present

## 2019-12-22 DIAGNOSIS — I110 Hypertensive heart disease with heart failure: Secondary | ICD-10-CM | POA: Diagnosis present

## 2019-12-22 DIAGNOSIS — Z881 Allergy status to other antibiotic agents status: Secondary | ICD-10-CM

## 2019-12-22 DIAGNOSIS — Z7982 Long term (current) use of aspirin: Secondary | ICD-10-CM | POA: Diagnosis not present

## 2019-12-22 DIAGNOSIS — E119 Type 2 diabetes mellitus without complications: Secondary | ICD-10-CM

## 2019-12-22 DIAGNOSIS — I5030 Unspecified diastolic (congestive) heart failure: Secondary | ICD-10-CM | POA: Diagnosis present

## 2019-12-22 HISTORY — DX: Cardiac arrhythmia, unspecified: I49.9

## 2019-12-22 HISTORY — DX: Heart failure, unspecified: I50.9

## 2019-12-22 LAB — CBC WITH DIFFERENTIAL/PLATELET
Abs Immature Granulocytes: 0.11 10*3/uL — ABNORMAL HIGH (ref 0.00–0.07)
Basophils Absolute: 0.1 10*3/uL (ref 0.0–0.1)
Basophils Relative: 0 %
Eosinophils Absolute: 0 10*3/uL (ref 0.0–0.5)
Eosinophils Relative: 0 %
HCT: 29.3 % — ABNORMAL LOW (ref 36.0–46.0)
Hemoglobin: 9.2 g/dL — ABNORMAL LOW (ref 12.0–15.0)
Immature Granulocytes: 1 %
Lymphocytes Relative: 21 %
Lymphs Abs: 3.2 10*3/uL (ref 0.7–4.0)
MCH: 25.9 pg — ABNORMAL LOW (ref 26.0–34.0)
MCHC: 31.4 g/dL (ref 30.0–36.0)
MCV: 82.5 fL (ref 80.0–100.0)
Monocytes Absolute: 1 10*3/uL (ref 0.1–1.0)
Monocytes Relative: 6 %
Neutro Abs: 11.4 10*3/uL — ABNORMAL HIGH (ref 1.7–7.7)
Neutrophils Relative %: 72 %
Platelets: 452 10*3/uL — ABNORMAL HIGH (ref 150–400)
RBC: 3.55 MIL/uL — ABNORMAL LOW (ref 3.87–5.11)
RDW: 15.9 % — ABNORMAL HIGH (ref 11.5–15.5)
WBC: 15.8 10*3/uL — ABNORMAL HIGH (ref 4.0–10.5)
nRBC: 0 % (ref 0.0–0.2)

## 2019-12-22 LAB — BASIC METABOLIC PANEL
Anion gap: 12 (ref 5–15)
BUN: 109 mg/dL — ABNORMAL HIGH (ref 8–23)
CO2: 30 mmol/L (ref 22–32)
Calcium: 9.2 mg/dL (ref 8.9–10.3)
Chloride: 91 mmol/L — ABNORMAL LOW (ref 98–111)
Creatinine, Ser: 1.82 mg/dL — ABNORMAL HIGH (ref 0.44–1.00)
GFR calc Af Amer: 30 mL/min — ABNORMAL LOW (ref >60)
GFR calc non Af Amer: 26 mL/min — ABNORMAL LOW (ref >60)
Glucose, Bld: 155 mg/dL — ABNORMAL HIGH (ref 70–99)
Potassium: 4.5 mmol/L (ref 3.5–5.1)
Sodium: 133 mmol/L — ABNORMAL LOW (ref 135–145)

## 2019-12-22 LAB — APTT: aPTT: 25 seconds (ref 24–36)

## 2019-12-22 LAB — HEMOGLOBIN AND HEMATOCRIT, BLOOD
HCT: 27.6 % — ABNORMAL LOW (ref 36.0–46.0)
Hemoglobin: 8.8 g/dL — ABNORMAL LOW (ref 12.0–15.0)

## 2019-12-22 LAB — PROTIME-INR
INR: 1 (ref 0.8–1.2)
Prothrombin Time: 13.2 seconds (ref 11.4–15.2)

## 2019-12-22 LAB — GLUCOSE, CAPILLARY: Glucose-Capillary: 175 mg/dL — ABNORMAL HIGH (ref 70–99)

## 2019-12-22 LAB — SARS CORONAVIRUS 2 BY RT PCR (HOSPITAL ORDER, PERFORMED IN ~~LOC~~ HOSPITAL LAB): SARS Coronavirus 2: NEGATIVE

## 2019-12-22 MED ORDER — CARVEDILOL 6.25 MG PO TABS
6.2500 mg | ORAL_TABLET | Freq: Two times a day (BID) | ORAL | Status: DC
  Filled 2019-12-22 (×2): qty 1
  Filled 2019-12-22: qty 2

## 2019-12-22 MED ORDER — CALCIUM CARBONATE-VITAMIN D 500-200 MG-UNIT PO TABS
1.0000 | ORAL_TABLET | Freq: Two times a day (BID) | ORAL | Status: DC
  Administered 2019-12-23 – 2019-12-25 (×5): 1 via ORAL
  Filled 2019-12-22 (×5): qty 1

## 2019-12-22 MED ORDER — METFORMIN HCL 500 MG PO TABS: 1000.0000 mg | ORAL_TABLET | Freq: Two times a day (BID) | ORAL | Status: DC

## 2019-12-22 MED ORDER — PRAVASTATIN SODIUM 20 MG PO TABS
20.0000 mg | ORAL_TABLET | Freq: Every day | ORAL | Status: DC
  Administered 2019-12-24: 20 mg via ORAL
  Filled 2019-12-22: qty 1

## 2019-12-22 MED ORDER — TORSEMIDE 20 MG PO TABS
40.0000 mg | ORAL_TABLET | Freq: Two times a day (BID) | ORAL | Status: DC
  Filled 2019-12-22: qty 2

## 2019-12-22 MED ORDER — SPIRONOLACTONE 25 MG PO TABS
25.0000 mg | ORAL_TABLET | Freq: Every day | ORAL | Status: DC
  Administered 2019-12-23: 25 mg via ORAL
  Filled 2019-12-22: qty 1

## 2019-12-22 MED ORDER — SODIUM CHLORIDE 0.9 % IV SOLN
INTRAVENOUS | Status: DC
Start: 2019-12-22 — End: 2019-12-22

## 2019-12-22 MED ORDER — INSULIN ASPART 100 UNIT/ML ~~LOC~~ SOLN
0.0000 [IU] | Freq: Three times a day (TID) | SUBCUTANEOUS | Status: DC
  Administered 2019-12-23: 4 [IU] via SUBCUTANEOUS
  Administered 2019-12-23: 7 [IU] via SUBCUTANEOUS
  Administered 2019-12-24: 4 [IU] via SUBCUTANEOUS
  Administered 2019-12-24: 11 [IU] via SUBCUTANEOUS
  Administered 2019-12-25 (×2): 4 [IU] via SUBCUTANEOUS
  Administered 2019-12-25: 3 [IU] via SUBCUTANEOUS
  Filled 2019-12-22 (×7): qty 1

## 2019-12-22 MED ORDER — SODIUM CHLORIDE 0.9 % IV SOLN
8.0000 mg/h | INTRAVENOUS | Status: AC
  Administered 2019-12-22 – 2019-12-23 (×2): 8 mg/h via INTRAVENOUS
  Filled 2019-12-22: qty 80

## 2019-12-22 MED ORDER — SODIUM CHLORIDE 0.9 % IV SOLN
80.0000 mg | Freq: Once | INTRAVENOUS | Status: AC
  Administered 2019-12-22: 80 mg via INTRAVENOUS
  Filled 2019-12-22: qty 80

## 2019-12-22 MED ORDER — POTASSIUM CHLORIDE CRYS ER 20 MEQ PO TBCR
40.0000 meq | EXTENDED_RELEASE_TABLET | Freq: Every day | ORAL | Status: DC
  Administered 2019-12-23 – 2019-12-24 (×2): 40 meq via ORAL
  Filled 2019-12-22 (×3): qty 2

## 2019-12-22 MED ORDER — INSULIN DETEMIR 100 UNIT/ML ~~LOC~~ SOLN
10.0000 [IU] | Freq: Two times a day (BID) | SUBCUTANEOUS | Status: DC
  Administered 2019-12-22 – 2019-12-25 (×6): 10 [IU] via SUBCUTANEOUS
  Filled 2019-12-22 (×9): qty 0.1

## 2019-12-22 MED ORDER — LEVOTHYROXINE SODIUM 50 MCG PO TABS
75.0000 ug | ORAL_TABLET | Freq: Every day | ORAL | Status: DC
  Administered 2019-12-24: 75 ug via ORAL
  Filled 2019-12-22: qty 2

## 2019-12-22 MED ORDER — LISINOPRIL 20 MG PO TABS
40.0000 mg | ORAL_TABLET | Freq: Every day | ORAL | Status: DC
  Administered 2019-12-23: 13:00:00 40 mg via ORAL
  Filled 2019-12-22: qty 4

## 2019-12-22 MED ORDER — SODIUM CHLORIDE 0.9 % IV SOLN: INTRAVENOUS | Status: AC

## 2019-12-22 MED ORDER — PANTOPRAZOLE SODIUM 40 MG IV SOLR: 40.0000 mg | Freq: Two times a day (BID) | INTRAVENOUS | Status: DC

## 2019-12-22 NOTE — ED Provider Notes (Signed)
[facility] Emergency Department Provider Note  ____________________________________________   First MD Initiated Contact with Patient 12/22/19 1621     (approximate)  I have reviewed the triage vital signs and the nursing notes.   HISTORY  Chief Complaint Abnormal Lab and Rectal Bleeding    HPI Tammie [name] is a 79 y.o. female with diabetes, hypertension who comes in for black sticky stools.  Patient's most recent hemoglobin was 12.4, 7 days ago.  She denies any NSAID use or iron supplementation.  Her repeat hemoglobin is at 9.7 today.  Patient denies any NSAID use.  Does take a baby aspirin daily.  Patient states that she was recently admitted for CHF exacerbation and was diuresed.  Since then she has been able to keep the weight down does not feel like she is gotten a ton of fluid reaccumulated.  She reports a little bit of shortness of breath but states that that seems to been going on since her CHF admission.  The black stools have been going on for the past 3 days, intermittent, nothing makes it better, nothing makes it worse.  Denies any abdominal pain.  States that she is just felt queasy to her stomach.          Past Medical History:  Diagnosis Date  . Cancer ([place])    skin  . Diabetes mellitus without complication ([place])   . Hypertension     Patient Active Problem List   Diagnosis Date Noted  . Pressure injury of skin 12/16/2019  . Hypercarbia 12/16/2019  . Demand ischemia ([place])   . Acute on chronic heart failure with preserved ejection fraction (HFpEF) ([place]) 12/10/2019  . Elevated troponin 12/10/2019  . Type 2 diabetes mellitus ([place]) 12/10/2019  . Essential hypertension 12/10/2019  . Heart failure with preserved ejection fraction ([place])   . Acute respiratory failure with hypoxia and hypercapnia ([place]) 12/08/2019    Past Surgical History:  Procedure Laterality Date  . ABDOMINAL HYSTERECTOMY    . BREAST BIOPSY Left 2013   CORE  W/CLIP - NEG    Prior to Admission medications   Medication Sig Start Date End Date Taking? Authorizing Provider  aspirin EC 81 MG tablet Take 81 mg by mouth daily.    [provider]  Calcium 600-400 MG-UNIT CHEW Chew 1 tablet by mouth 2 (two) times daily.    [provider]  glimepiride (AMARYL) 2 MG tablet Take 2 mg by mouth daily with breakfast.    [provider]  insulin detemir (LEVEMIR) 100 UNIT/ML injection Inject 0.1 mLs (10 Units total) into the skin 2 (two) times daily. 12/16/19   Ezekiel Slocumb, DO  levothyroxine (SYNTHROID) 75 MCG tablet Take 75 mcg by mouth daily. 10/24/19   [provider]  lovastatin (MEVACOR) 20 MG tablet Take 20 mg by mouth at bedtime. 10/24/19   [provider]  metFORMIN (GLUCOPHAGE) 1000 MG tablet Take 1,000 mg by mouth 2 (two) times daily. 10/24/19   [provider]  naproxen sodium (ALEVE) 220 MG tablet Take 220 mg by mouth 2 (two) times daily as needed (pain).    [provider]  omeprazole (PRILOSEC) 20 MG capsule Take 20 mg by mouth daily.    [provider]  potassium chloride SA (KLOR-CON) 20 MEQ tablet Take 2 tablets (40 mEq total) by mouth daily. 12/17/19   Ezekiel Slocumb, DO  spironolactone (ALDACTONE) 25 MG tablet Take 1 tablet (25 mg total) by mouth daily. 12/17/19  Nicole Kindred A, DO  torsemide (DEMADEX) 20 MG tablet Take 2 tablets (40 mg total) by mouth 2 (two) times daily. 12/16/19   Ezekiel Slocumb, DO    Allergies Ampicillin, Contrast media [iodinated diagnostic agents], Penicillin g, Sulfamethoxazole-trimethoprim, Tetracyclines & related, Verapamil, and Amlodipine  Family History  Problem Relation Age of Onset  . Breast cancer Other   . Breast cancer Sister 78    Social History Social History   Tobacco Use  . Smoking status: Never Smoker  . Smokeless tobacco: Never Used  Substance Use Topics  . Alcohol use: Not Currently  . Drug use: Not on file       Review of Systems Constitutional: No fever/chills, queasiness Eyes: No visual changes. ENT: No sore throat. Cardiovascular: Denies chest pain. Respiratory: Mild shortness of breath since her recent admission Gastrointestinal: No abdominal pain.  No nausea, no vomiting.  No diarrhea.  No constipation.  Black stool Genitourinary: Negative for dysuria. Musculoskeletal: Negative for back pain. Skin: Negative for rash. Neurological: Negative for headaches, focal weakness or numbness. All other ROS negative ____________________________________________   PHYSICAL EXAM:  VITAL SIGNS: ED Triage Vitals  Enc Vitals Group     BP 12/22/19 1308 127/75     Pulse Rate 12/22/19 1308 (!) 108     Resp 12/22/19 1308 16     Temp 12/22/19 1308 98.1 F (36.7 C)     Temp Source 12/22/19 1308 Oral     SpO2 12/22/19 1308 99 %     Weight 12/22/19 1310 200 lb 6.4 oz (90.9 kg)     Height 12/22/19 1310 5\' 4"  (1.626 m)     Head Circumference --      Peak Flow --      Pain Score --      Pain Loc --      Pain Edu? --      Excl. in [place]? --     Constitutional: Alert and oriented. Well appearing and in no acute distress. Eyes: Conjunctivae are normal. EOMI. Head: Atraumatic. Nose: No congestion/rhinnorhea. Mouth/Throat: Mucous membranes are moist.   Neck: No stridor. Trachea Midline. FROM Cardiovascular: Tachycardic, regular rhythm. Grossly normal heart sounds.  Good peripheral circulation. Respiratory: Normal respiratory effort.  No retractions. Lungs CTAB. Gastrointestinal: Soft and nontender. No distention. No abdominal bruits.  Musculoskeletal: No lower extremity tenderness nor edema.  No joint effusions. Neurologic:  Normal speech and language. No gross focal neurologic deficits are appreciated.  Skin:  Skin is warm, dry and intact. No rash noted. Psychiatric: Mood and affect are normal. Speech and behavior are normal. GU: Black stool, Hemoccult  positive  ____________________________________________   LABS (all labs ordered are listed, but only abnormal results are displayed)  Labs Reviewed  SARS CORONAVIRUS 2 BY RT PCR ([facility])  CBC WITH DIFFERENTIAL/PLATELET  BASIC METABOLIC PANEL  PROTIME-INR  APTT  POC OCCULT BLOOD, ED  TYPE AND SCREEN   ____________________________________________   ED ECG REPORT I, Vanessa Empire, the attending physician, personally viewed and interpreted this ECG.  Sinus tachycardia rate of 100, no ST elevation, no T inversions, occasional PVCs ____________________________________________  RADIOLOGY Robert Bellow, personally viewed and evaluated these images (plain radiographs) as part of my medical decision making, as well as reviewing the written report by the radiologist.  ED MD interpretation: Minimal effusion left   Official radiology report(s): DG Chest Portable 1 View  Result Date: 12/22/2019 CLINICAL DATA:  Short of breath, dark  stools, anemia EXAM: PORTABLE CHEST 1 VIEW COMPARISON:  12/11/2019 FINDINGS: Single frontal view of the chest demonstrates a stable cardiac silhouette. There is minimal residual left basilar consolidation and effusion, with marked improvement since prior study. Right chest is clear. No pneumothorax. Cardiac silhouette is unremarkable. IMPRESSION: 1. Minimal residual left basilar consolidation and effusion, with marked improvement since prior exam. Electronically Signed   By: Randa Ngo M.D.   On: 12/22/2019 17:36    ____________________________________________   PROCEDURES  Procedure(s) performed (including Critical Care):  Procedures   ____________________________________________   INITIAL IMPRESSION / ASSESSMENT AND PLAN / ED COURSE  Stpehanie B Scoggin was evaluated in Emergency Department on 12/22/2019 for the symptoms described in the history of present illness. She was evaluated in the context  of the global COVID-19 pandemic, which necessitated consideration that the patient might be at risk for infection with the SARS-CoV-2 virus that causes COVID-19. Institutional protocols and algorithms that pertain to the evaluation of patients at risk for COVID-19 are in a state of rapid change based on information released by regulatory bodies including the CDC and federal and state organizations. These policies and algorithms were followed during the patient's care in the ED.    Patient is a 79 year old who comes in with black stools with a hemoglobin drop.  Only on aspirin 81 mg.  Suspect this could be secondary to gastritis, ulcer.  Will start on PPI.  She denies any history of alcohol use or liver issues to suggest variceal bleeding.  Her abdomen is soft and nontender to suggest acute abdominal process.  Her rectal exam is consistent with melena.  Patient is slightly tachycardic will get an EKG.  Will get a chest x-ray especially if we need to transfuse her to make sure there is no significant pulmonary edema although she appears to be breathing comfortably.  Hemoglobin is down from 9.2 from 9.7 earlier today.  No coagulopathy noted.  Does have a little bit of a low chloride and sodium and elevated creatinine.  She has been diuresed very heavily recently so could be secondary to that.  Do not want to give her any fluid in case she does need blood.    Discussed with the hospital team for admission for melena/GI bleed.        ____________________________________________   FINAL CLINICAL IMPRESSION(S) / ED DIAGNOSES   Final diagnoses:  Gastrointestinal hemorrhage with melena      MEDICATIONS GIVEN DURING THIS   Medications  pantoprazole (PROTONIX) 80 mg in sodium chloride 0.9 % 100 mL (0.8 mg/mL) infusion (8 mg/hr Intravenous New Bag/Given 12/22/19 1829)  pantoprazole (PROTONIX) injection 40 mg (has no administration in time range)  pantoprazole (PROTONIX) 80 mg in sodium chloride 0.9  % 100 mL IVPB (0 mg Intravenous Stopped 12/22/19 1813)     ED Discharge Orders    None       Note:  This document was prepared using Dragon voice recognition software and may include unintentional dictation errors.   Vanessa [place], MD 12/22/19 1900

## 2019-12-22 NOTE — ED Triage Notes (Signed)
Has been having black tarry stools. Last this am.  Martin Majestic to doctor today and they sent her here for low  hgb

## 2019-12-22 NOTE — ED Notes (Signed)
Purple top, green top and long pink top were drawn with IV start.

## 2019-12-22 NOTE — ED Notes (Signed)
Repeat type and screen collected and sent.

## 2019-12-22 NOTE — ED Notes (Signed)
Attempted to call report

## 2019-12-22 NOTE — ED Notes (Signed)
Blood bank called this RN about recollect of type and screen due to paperwork not being sent

## 2019-12-22 NOTE — ED Notes (Signed)
Lab called and Long pink (blood bank tube) is hemolyzed.

## 2019-12-22 NOTE — H&P (Addendum)
History and Physical    Tammie [name] HDQ:[id number] DOB: [birth year] DOA: 12/22/2019  PCP: Dion Body, MD  Patient coming from: home  I have personally briefly reviewed patient's old medical records in [place]  Chief Complaint: melena  HPI: Tammie [name] is a 79 y.o. female with medical history significant of DM, HTN, recent admission for acute respiratory failure d/c'd July 6th, 2021. She developed black, tarry stools on Friday. She has been feeling weak, minor SOB. She saw her primary medical provider who referred her to the ED.   ED Course: VSS stable. Unremarkable exam with non-tender abdomen but frank melena on rectal exam. Lab revealed Hgb of 9.2, down from 12.4 July 5th; Cr 1.82 up from 1.17. IVF started, IV protonix started, typed and cross-matched. TR[facility] called to admit patient for mgt.  Review of Systems: As per HPI otherwise 10 point review of systems negative.    Past Medical History:  Diagnosis Date  . Cancer ([place])    skin  . Diabetes mellitus without complication ([place])   . Hypertension     Past Surgical History:  Procedure Laterality Date  . ABDOMINAL HYSTERECTOMY    . BREAST BIOPSY Left 2013   CORE W/CLIP - NEG   Soc Hx - widowed 2019 after 43 yrs. She has 2 sons, 5 grandchildren, 3 great-grandchildren. She lives alone but her sons and grandchildren look in on her and help around the home. Since her hospital d/c she has been staying at her younger sons.    reports that she has never smoked. She has never used smokeless tobacco. She reports previous alcohol use. No history on file for drug use.  Allergies  Allergen Reactions  . Ampicillin Hives  . Contrast Media [Iodinated Diagnostic Agents] Hives  . Penicillin G Hives  . Sulfamethoxazole-Trimethoprim Hives  . Tetracyclines & Related Hives  . Verapamil Hives  . Amlodipine Rash    Family History  Problem Relation Age of Onset  . Breast cancer Other   . Breast cancer Sister  20    Prior to Admission medications   Medication Sig Start Date End Date Taking? Authorizing Provider  carvedilol (COREG) 6.25 MG tablet Take 6.25 mg by mouth 2 (two) times daily with a meal.   Yes [provider]  furosemide (LASIX) 20 MG tablet Take 20 mg by mouth daily as needed. 09/25/19  Yes [provider]  insulin detemir (LEVEMIR) 100 UNIT/ML injection Inject 0.1 mLs (10 Units total) into the skin 2 (two) times daily. 12/16/19  Yes Ezekiel Slocumb, DO  levothyroxine (SYNTHROID) 75 MCG tablet Take 75 mcg by mouth daily. 10/24/19  Yes [provider]  lisinopril (ZESTRIL) 40 MG tablet Take 40 mg by mouth daily.   Yes [provider]  lovastatin (MEVACOR) 20 MG tablet Take 20 mg by mouth at bedtime. 10/24/19  Yes [provider]  metFORMIN (GLUCOPHAGE) 1000 MG tablet Take 1,000 mg by mouth 2 (two) times daily. 10/24/19  Yes [provider]  pantoprazole (PROTONIX) 40 MG tablet Take 40 mg by mouth daily. 12/22/19  Yes [provider]  potassium chloride SA (KLOR-CON) 20 MEQ tablet Take 2 tablets (40 mEq total) by mouth daily. 12/17/19  Yes Nicole Kindred A, DO  spironolactone (ALDACTONE) 25 MG tablet Take 1 tablet (25 mg total) by mouth daily. 12/17/19  Yes Nicole Kindred A, DO  torsemide (DEMADEX) 20 MG tablet Take 2 tablets (40 mg total) by mouth 2 (two) times daily. 12/16/19  Yes Ezekiel Slocumb, DO  aspirin EC 81 MG tablet Take 81 mg by mouth daily.    [provider]  Calcium 600-400 MG-UNIT CHEW Chew 1 tablet by mouth 2 (two) times daily.    [provider]  glimepiride (AMARYL) 2 MG tablet Take 2 mg by mouth daily with breakfast. Patient not taking: Reported on 12/22/2019    [provider]  naproxen sodium (ALEVE) 220 MG tablet Take 220 mg by mouth 2 (two) times daily as needed (pain).    [provider]  omeprazole (PRILOSEC) 20 MG capsule Take 20 mg by mouth daily.    [provider]    Physical Exam: Vitals:   12/22/19 1310 12/22/19 1759 12/22/19 1816 12/22/19 1830  BP:   120/77 (!) 118/96  Pulse:  99 (!) 102 (!) 103  Resp:  15 17 19   Temp:      TempSrc:      SpO2:  97% 96% 94%  Weight: 90.9 kg     Height: 5\' 4"  (1.626 m)       Constitutional: NAD, calm, comfortable Vitals:   12/22/19 1310 12/22/19 1759 12/22/19 1816 12/22/19 1830  BP:   120/77 (!) 118/96  Pulse:  99 (!) 102 (!) 103  Resp:  15 17 19   Temp:      TempSrc:      SpO2:  97% 96% 94%  Weight: 90.9 kg     Height: 5\' 4"  (1.626 m)      General:  WNWD overweight woman in no distress Eyes: PERRL, lids and conjunctivae normal, w/o icterus ENMT: Mucous membranes are moist. Posterior pharynx clear of any exudate or lesions.Normal dentition.  Neck: normal, supple, no masses, no thyromegaly Respiratory: clear to auscultation bilaterally, no wheezing, no crackles. Normal respiratory effort. No accessory muscle use.  Cardiovascular: Regular rate and rhythm, no murmurs / rubs / gallops. No extremity edema. 1+ pedal pulses. No carotid bruits.  Abdomen: no tenderness, no masses palpated. No hepatosplenomegaly. Bowel sounds positive.  Musculoskeletal: no clubbing / cyanosis. No joint deformity upper and lower extremities. Good ROM, no contractures. Normal muscle tone.  Skin: no rashes, lesions, ulcers. No induration Neurologic: CN 2-12 grossly intact. Sensation intact, DTR normal. Strength 5/5 in all 4.  Psychiatric: Normal judgment and insight. Alert and oriented x 3. Normal mood.     Labs on Admission: I have personally reviewed following labs and imaging studies  CBC: Recent Labs  Lab 12/22/19 1719  WBC 15.8*  NEUTROABS 11.4*  HGB 9.2*  HCT 29.3*  MCV 82.5  PLT 456*   Basic Metabolic Panel: Recent Labs  Lab 12/16/19 0444 12/22/19 1719  NA 139 133*  K 3.7 4.5  CL 89* 91*  CO2 36* 30  GLUCOSE 191* 155*  BUN 35* 109*  CREATININE 1.17* 1.82*  CALCIUM 9.2 9.2  MG 1.6*  --     GFR: Estimated Creatinine Clearance: 27.4 mL/min (A) (by C-G formula based on SCr of 1.82 mg/dL (H)). Liver Function Tests: No results for input(s): AST, ALT, ALKPHOS, BILITOT, PROT, ALBUMIN in the last 168 hours. No results for input(s): LIPASE, AMYLASE in the last 168 hours. No results for input(s): AMMONIA in the last 168 hours. Coagulation Profile: Recent Labs  Lab 12/22/19 1719  INR 1.0   Cardiac Enzymes: No results for input(s): CKTOTAL, CKMB, CKMBINDEX, TROPONINI in the last 168 hours. BNP (last 3 results) No results for input(s): PROBNP in the last 8760 hours. HbA1C: No results for input(s):  HGBA1C in the last 72 hours. CBG: Recent Labs  Lab 12/15/19 2334 12/16/19 0540 12/16/19 0821 12/16/19 1144 12/16/19 1726  GLUCAP 160* 178* 164* 234* 148*   Lipid Profile: No results for input(s): CHOL, HDL, LDLCALC, TRIG, CHOLHDL, LDLDIRECT in the last 72 hours. Thyroid Function Tests: No results for input(s): TSH, T4TOTAL, FREET4, T3FREE, THYROIDAB in the last 72 hours. Anemia Panel: No results for input(s): VITAMINB12, FOLATE, FERRITIN, TIBC, IRON, RETICCTPCT in the last 72 hours. Urine analysis: No results found for: COLORURINE, APPEARANCEUR, [place], [place], GLUCOSEU, [employer], BILIRUBINUR, KETONESUR, PROTEINUR, UROBILINOGEN, NITRITE, LEUKOCYTESUR  Radiological Exams on Admission: DG Chest Portable 1 View  Result Date: 12/22/2019 CLINICAL DATA:  Short of breath, dark stools, anemia EXAM: PORTABLE CHEST 1 VIEW COMPARISON:  12/11/2019 FINDINGS: Single frontal view of the chest demonstrates a stable cardiac silhouette. There is minimal residual left basilar consolidation and effusion, with marked improvement since prior study. Right chest is clear. No pneumothorax. Cardiac silhouette is unremarkable. IMPRESSION: 1. Minimal residual left basilar consolidation and effusion, with marked improvement since prior exam. Electronically Signed   By: Randa Ngo M.D.   On: 12/22/2019  17:36    EKG: Independently reviewed. No new EKG done in ED  Assessment/Plan Active Problems:   Melena   Heart failure with preserved ejection fraction (HCC)   Type 2 diabetes mellitus ([place])   Essential hypertension   Gastrointestinal hemorrhage with melena  (please populate well all problems here in Problem List. (For example, if patient is on BP meds at home and you resume or decide to hold them, it is a problem that needs to be her. Same for CAD, COPD, HLD and so on)   1. Melena - 4 day h/o black, tarry, odiferous stools. 3 g drop in Hgb with last stool this AM. No abdominal pain. No NSAIDs. Plan Med-surg admit  H/H q 6 hrs  Transfusion threshhold Hgb < 8g  Protonix infusion  NPO except clear liquids until midnight  GI consult placed  2. HFw/ preserved LV fxn - stable on her medical regimen  3. DM - will continue home meds except metformin-stopped due to AKI Plan Sliding scale coverage  4. HTN - stable. Continue home meds.   5. AKI - rise in Cr from 1.17 to 1.82, suspect volume depletion 2/2 blood loss. Plan NS at 50cc/hr x 6 hrs then 10 cc/hr  Bmet in AM  Hold metformin  DVT prophylaxis: foot pump  Code Status: full code  Family Communication: Son present during exam. Understands dx and tx plan. Answered all questions.  Disposition Plan: home when medically stable  Consults called: GI - Dr. Vicente Males called and messaged via [facility] Admission status: inpatient    Adella Hare MD [facility] Pager [phone]  If 7PM-7AM, please contact night-coverage [email or web address] Password [facility]  12/22/2019, 8:04 PM

## 2019-12-23 ENCOUNTER — Encounter
Admission: EM | Disposition: A | Discharge status: Home Health | Payer: Self-pay | Source: Home / Self Care | Attending: Internal Medicine

## 2019-12-23 ENCOUNTER — Encounter: Payer: Self-pay | Admitting: Internal Medicine

## 2019-12-23 ENCOUNTER — Ambulatory Visit: Payer: Medicare HMO | Admitting: Family

## 2019-12-23 ENCOUNTER — Inpatient Hospital Stay: Payer: Medicare HMO | Admitting: Anesthesiology

## 2019-12-23 ENCOUNTER — Other Ambulatory Visit: Payer: Self-pay

## 2019-12-23 DIAGNOSIS — K921 Melena: Secondary | ICD-10-CM

## 2019-12-23 DIAGNOSIS — N179 Acute kidney failure, unspecified: Secondary | ICD-10-CM

## 2019-12-23 DIAGNOSIS — I10 Essential (primary) hypertension: Secondary | ICD-10-CM

## 2019-12-23 HISTORY — PX: ESOPHAGOGASTRODUODENOSCOPY (EGD) WITH PROPOFOL: SHX5813

## 2019-12-23 LAB — BASIC METABOLIC PANEL
Anion gap: 14 (ref 5–15)
BUN: 100 mg/dL — ABNORMAL HIGH (ref 8–23)
CO2: 29 mmol/L (ref 22–32)
Calcium: 9.1 mg/dL (ref 8.9–10.3)
Chloride: 93 mmol/L — ABNORMAL LOW (ref 98–111)
Creatinine, Ser: 1.7 mg/dL — ABNORMAL HIGH (ref 0.44–1.00)
GFR calc Af Amer: 33 mL/min — ABNORMAL LOW (ref >60)
GFR calc non Af Amer: 28 mL/min — ABNORMAL LOW (ref >60)
Glucose, Bld: 174 mg/dL — ABNORMAL HIGH (ref 70–99)
Potassium: 4.2 mmol/L (ref 3.5–5.1)
Sodium: 136 mmol/L (ref 135–145)

## 2019-12-23 LAB — HEMOGLOBIN AND HEMATOCRIT, BLOOD
HCT: 27.2 % — ABNORMAL LOW (ref 36.0–46.0)
HCT: 27 % — ABNORMAL LOW (ref 36.0–46.0)
HCT: 26.1 % — ABNORMAL LOW (ref 36.0–46.0)
HCT: 22.2 % — ABNORMAL LOW (ref 36.0–46.0)
Hemoglobin: 8.5 g/dL — ABNORMAL LOW (ref 12.0–15.0)
Hemoglobin: 8.4 g/dL — ABNORMAL LOW (ref 12.0–15.0)
Hemoglobin: 8 g/dL — ABNORMAL LOW (ref 12.0–15.0)
Hemoglobin: 6.9 g/dL — ABNORMAL LOW (ref 12.0–15.0)

## 2019-12-23 LAB — ABO/RH: ABO/RH(D): B NEG

## 2019-12-23 LAB — HEMOGLOBIN A1C
Hgb A1c MFr Bld: 7.1 % — ABNORMAL HIGH (ref 4.8–5.6)
Mean Plasma Glucose: 157.07 mg/dL

## 2019-12-23 LAB — PREPARE RBC (CROSSMATCH)

## 2019-12-23 LAB — GLUCOSE, CAPILLARY
Glucose-Capillary: 202 mg/dL — ABNORMAL HIGH (ref 70–99)
Glucose-Capillary: 168 mg/dL — ABNORMAL HIGH (ref 70–99)
Glucose-Capillary: 151 mg/dL — ABNORMAL HIGH (ref 70–99)
Glucose-Capillary: 128 mg/dL — ABNORMAL HIGH (ref 70–99)
Glucose-Capillary: 108 mg/dL — ABNORMAL HIGH (ref 70–99)

## 2019-12-23 SURGERY — ESOPHAGOGASTRODUODENOSCOPY (EGD) WITH PROPOFOL
Anesthesia: General

## 2019-12-23 MED ORDER — NOREPINEPHRINE 4 MG/250ML-% IV SOLN
INTRAVENOUS | Status: AC
  Administered 2019-12-23: 2 ug/min via INTRAVENOUS
  Filled 2019-12-23: qty 250

## 2019-12-23 MED ORDER — NOREPINEPHRINE 4 MG/250ML-% IV SOLN: 2.0000 ug/min | INTRAVENOUS | Status: DC

## 2019-12-23 MED ORDER — PROPOFOL 500 MG/50ML IV EMUL
INTRAVENOUS | Status: DC | PRN
  Administered 2019-12-23: 125 ug/kg/min via INTRAVENOUS

## 2019-12-23 MED ORDER — SODIUM CHLORIDE 0.9 % IV BOLUS
500.0000 mL | Freq: Once | INTRAVENOUS | Status: AC
  Administered 2019-12-23: 15:00:00 500 mL via INTRAVENOUS

## 2019-12-23 MED ORDER — SODIUM CHLORIDE 0.9% IV SOLUTION: Freq: Once | INTRAVENOUS | Status: AC

## 2019-12-23 MED ORDER — SODIUM CHLORIDE 0.9 % IV SOLN: INTRAVENOUS | Status: DC

## 2019-12-23 MED ORDER — MIDODRINE HCL 5 MG PO TABS
10.0000 mg | ORAL_TABLET | Freq: Once | ORAL | Status: AC
  Administered 2019-12-23: 16:00:00 10 mg via ORAL
  Filled 2019-12-23: qty 2

## 2019-12-23 MED ORDER — SODIUM CHLORIDE 0.9 % IV BOLUS: 250.0000 mL | Freq: Once | INTRAVENOUS | Status: DC

## 2019-12-23 MED ORDER — SODIUM CHLORIDE 0.9% IV SOLUTION: Freq: Once | INTRAVENOUS | Status: DC

## 2019-12-23 MED ORDER — SODIUM CHLORIDE 0.9 % IV BOLUS
500.0000 mL | Freq: Once | INTRAVENOUS | Status: AC
  Administered 2019-12-23: 17:00:00 500 mL via INTRAVENOUS

## 2019-12-23 MED ORDER — CHLORHEXIDINE GLUCONATE CLOTH 2 % EX PADS
6.0000 | MEDICATED_PAD | Freq: Every day | CUTANEOUS | Status: DC
  Administered 2019-12-24: 6 via TOPICAL

## 2019-12-23 MED ORDER — EPINEPHRINE 1 MG/10ML IJ SOSY
PREFILLED_SYRINGE | INTRAMUSCULAR | Status: AC
  Filled 2019-12-23: qty 10

## 2019-12-23 MED ORDER — PROPOFOL 10 MG/ML IV BOLUS
INTRAVENOUS | Status: DC | PRN
  Administered 2019-12-23: 30 mg via INTRAVENOUS

## 2019-12-23 MED ORDER — LIDOCAINE HCL (CARDIAC) PF 100 MG/5ML IV SOSY
PREFILLED_SYRINGE | INTRAVENOUS | Status: DC | PRN
  Administered 2019-12-23: 100 mg via INTRAVENOUS

## 2019-12-23 NOTE — Significant Event (Addendum)
Rapid Response Follow-Up:  - Patient remains hypotensive at 1645 hrs. Second 500 mL bolus initiated per VO of Dr. Reesa Chew.  - At 1715 hrs, following the second bolus, the patient remains hypotensive at 90/40 (56) in trendelenberg. Dr. Reesa Chew notified. Order for 250 cc NS bolus placed. Patient to transfer to SDU for closer BP management.

## 2019-12-23 NOTE — H&P (Signed)
Jonathon Bellows, MD [address], [place], [place], [place], [address] [address], [place], [place], [place], [address] Phone: [phone]  Fax: [phone]  Primary Care Physician:  Dion Body, MD   Pre-Procedure History & Physical: HPI:  Tammie [name] is a 79 y.o. female is here for an endoscopy    Past Medical History:  Diagnosis Date  . Cancer ([place])    skin  . CHF (congestive heart failure) ([place])   . Diabetes mellitus without complication ([place])   . Dysrhythmia   . Hypertension     Past Surgical History:  Procedure Laterality Date  . ABDOMINAL HYSTERECTOMY    . BREAST BIOPSY Left 2013   CORE W/CLIP - NEG    Prior to Admission medications   Medication Sig Start Date End Date Taking? Authorizing Provider  aspirin EC 81 MG tablet Take 81 mg by mouth daily.   Yes [provider]  furosemide (LASIX) 20 MG tablet Take 20 mg by mouth daily as needed. 09/25/19  Yes [provider]  glimepiride (AMARYL) 2 MG tablet Take 2 mg by mouth daily with breakfast.    Yes [provider]  insulin detemir (LEVEMIR) 100 UNIT/ML injection Inject 0.1 mLs (10 Units total) into the skin 2 (two) times daily. 12/16/19  Yes Ezekiel Slocumb, DO  levothyroxine (SYNTHROID) 75 MCG tablet Take 75 mcg by mouth daily. 10/24/19  Yes [provider]  lisinopril (ZESTRIL) 40 MG tablet Take 40 mg by mouth daily.   Yes [provider]  lovastatin (MEVACOR) 20 MG tablet Take 20 mg by mouth at bedtime. 10/24/19  Yes [provider]  metFORMIN (GLUCOPHAGE) 1000 MG tablet Take 1,000 mg by mouth 2 (two) times daily. 10/24/19  Yes [provider]  naproxen sodium (ALEVE) 220 MG tablet Take 220 mg by mouth 2 (two) times daily as needed (pain).   Yes [provider]  omeprazole (PRILOSEC) 20 MG capsule Take 20 mg by mouth daily.   Yes [provider]  potassium chloride SA (KLOR-CON) 20 MEQ tablet Take 2 tablets (40  mEq total) by mouth daily. 12/17/19  Yes Nicole Kindred A, DO  spironolactone (ALDACTONE) 25 MG tablet Take 1 tablet (25 mg total) by mouth daily. 12/17/19  Yes Nicole Kindred A, DO  torsemide (DEMADEX) 20 MG tablet Take 2 tablets (40 mg total) by mouth 2 (two) times daily. 12/16/19  Yes Nicole Kindred A, DO  Calcium 600-400 MG-UNIT CHEW Chew 1 tablet by mouth 2 (two) times daily.    [provider]  carvedilol (COREG) 6.25 MG tablet Take 6.25 mg by mouth 2 (two) times daily with a meal. Patient not taking: Reported on 12/22/2019    [provider]    Allergies as of 12/22/2019 - Review Complete 12/22/2019  Allergen Reaction Noted  . Ampicillin Hives 12/02/2019  . Contrast media [iodinated diagnostic agents] Hives 12/02/2019  . Penicillin g Hives 12/02/2019  . Sulfamethoxazole-trimethoprim Hives 12/02/2019  . Tetracyclines & related Hives 12/02/2019  . Verapamil Hives 12/02/2019  . Amlodipine Rash 12/02/2019    Family History  Problem Relation Age of Onset  . Breast cancer Other   . Breast cancer Sister 52    Social History   Socioeconomic History  . Marital status: Married    Spouse name: Not on file  . Number of children: Not on file  . Years of education: Not on file  . Highest education level: Not on file  Occupational History  .  Not on file  Tobacco Use  . Smoking status: Never Smoker  . Smokeless tobacco: Never Used  Substance and Sexual Activity  . Alcohol use: Not Currently  . Drug use: Never  . Sexual activity: Not on file  Other Topics Concern  . Not on file  Social History Narrative  . Not on file   Social Determinants of Health   Financial Resource Strain:   . Difficulty of Paying Living Expenses:   Food Insecurity:   . Worried About Charity fundraiser in the Last Year:   . Arboriculturist in the Last Year:   Transportation Needs:   . Film/video editor (Medical):   [name] Lack of Transportation (Non-Medical):   Physical Activity:     . Days of Exercise per Week:   . Minutes of Exercise per Session:   Stress:   . Feeling of Stress :   Social Connections:   . Frequency of Communication with Friends and Family:   . Frequency of Social Gatherings with Friends and Family:   . Attends Religious Services:   . Active Member of Clubs or Organizations:   . Attends Archivist Meetings:   [name] Marital Status:   Intimate Partner Violence:   . Fear of Current or Ex-Partner:   . Emotionally Abused:   [name] Physically Abused:   . Sexually Abused:     Review of Systems: See HPI, otherwise negative ROS  Physical Exam: BP (!) 106/50   Pulse 91   Temp (!) 97.3 F (36.3 C) (Temporal)   Resp 16   Ht 5\' 4"  (1.626 m)   Wt 90.9 kg   SpO2 96%   BMI 34.40 kg/m  General:   Alert,  pleasant and cooperative in NAD Head:  Normocephalic and atraumatic. Neck:  Supple; no masses or thyromegaly. Lungs:  Clear throughout to auscultation, normal respiratory effort.    Heart:  +S1, +S2, Regular rate and rhythm, No edema. Abdomen:  Soft, nontender and nondistended. Normal bowel sounds, without guarding, and without rebound.   Neurologic:  Alert and  oriented x4;  grossly normal neurologically.  Impression/Plan: Tammie [name] is here for an endoscopy  to be performed for  evaluation of melena    Risks, benefits, limitations, and alternatives regarding endoscopy have been reviewed with the patient.  Questions have been answered.  All parties agreeable.   Jonathon Bellows, MD  12/23/2019, 11:12 AM

## 2019-12-23 NOTE — Progress Notes (Signed)
Patient stated she needed to use the restroom. Patient appears more lethargic than earlier this shift and states she feels dizzy and is weak before getting out of bed. Patients BP 82/39. MD made aware, 572ml fluid bolus ordered.

## 2019-12-23 NOTE — Anesthesia Preprocedure Evaluation (Signed)
Anesthesia Evaluation  Patient identified by MRN, date of birth, ID band Patient awake    Reviewed: Allergy & Precautions, NPO status , Patient's Chart, lab work & pertinent test results  History of Anesthesia Complications Negative for: history of anesthetic complications  Airway Mallampati: II  TM Distance: >3 FB Neck ROM: Limited    Dental no notable dental hx. (+) Teeth Intact   Pulmonary neg pulmonary ROS, neg sleep apnea, neg COPD, Patient abstained from smoking.Not current smoker,    Pulmonary exam normal breath sounds clear to auscultation       Cardiovascular Exercise Tolerance: Good METShypertension, +CHF  (-) CAD and (-) Past MI (-) dysrhythmias + Valvular Problems/Murmurs  Rhythm:Regular Rate:Normal - Systolic murmurs TTE 7902:  1. Left ventricular ejection fraction, by estimation, is 60 to 65%. The  left ventricle has normal function. The left ventricle has no regional  wall motion abnormalities. There is mild left ventricular hypertrophy.  Left ventricular diastolic parameters  are consistent with Grade II diastolic dysfunction (pseudonormalization).  Elevated left atrial pressure.  2. Pulmonary artery pressure is mildly to moderately elevated (RVSP 35  mmHg plus central venous pressure). Right ventricular systolic function is  normal. The right ventricular size is mildly enlarged. Mildly increased  right ventricular wall thickness.  3. The mitral valve is degenerative. Trivial mitral valve regurgitation.  No evidence of mitral stenosis.  4. Tricuspid valve regurgitation is mild to moderate.  5. The aortic valve was not well visualized. Aortic valve regurgitation  is not visualized. No aortic stenosis is present.    Neuro/Psych negative neurological ROS  negative psych ROS   GI/Hepatic neg GERD  ,(+)     (-) substance abuse  ,   Endo/Other  diabetes  Renal/GU negative Renal ROS      Musculoskeletal   Abdominal   Peds  Hematology   Anesthesia Other Findings Past Medical History: No date: Cancer ([place])     Comment:  skin No date: CHF (congestive heart failure) (HCC) No date: Diabetes mellitus without complication (HCC) No date: Dysrhythmia No date: Hypertension  Reproductive/Obstetrics                             Anesthesia Physical Anesthesia Plan  ASA: III  Anesthesia Plan: General   Post-op Pain Management:    Induction: Intravenous  PONV Risk Score and Plan: 3 and Ondansetron, Propofol infusion and TIVA  Airway Management Planned: Nasal Cannula and Natural Airway  Additional Equipment: None  Intra-op Plan:   Post-operative Plan:   Informed Consent: I have reviewed the patients History and Physical, chart, labs and discussed the procedure including the risks, benefits and alternatives for the proposed anesthesia with the patient or authorized representative who has indicated his/her understanding and acceptance.     Dental advisory given  Plan Discussed with: CRNA and Surgeon  Anesthesia Plan Comments: (Discussed risks of anesthesia with patient, including possibility of difficulty with spontaneous ventilation under anesthesia necessitating airway intervention, PONV, and rare risks such as cardiac or respiratory or neurological events. Patient understands.)        Anesthesia Quick Evaluation

## 2019-12-23 NOTE — Op Note (Signed)
[facility] Gastroenterology Patient Name: Tammie [name] Procedure Date: 12/23/2019 10:45 AM MRN: [id number] Account #: [id number] Date of Birth: 15-Feb-1941 Admit Type: Inpatient Age: 79 Room: [facility] ENDO ROOM 1 Gender: Female Note Status: Finalized Procedure:             Upper GI endoscopy Indications:           Melena Providers:             Jonathon Bellows MD, MD Medicines:             Monitored Anesthesia Care Complications:         No immediate complications. Procedure:             Pre-Anesthesia Assessment:                        - Prior to the procedure, a History and Physical was                         performed, and patient medications, allergies and                         sensitivities were reviewed. The patient's tolerance                         of previous anesthesia was reviewed.                        - The risks and benefits of the procedure and the                         sedation options and risks were discussed with the                         patient. All questions were answered and informed                         consent was obtained.                        - ASA Grade Assessment: II - A patient with mild                         systemic disease.                        After obtaining informed consent, the endoscope was                         passed under direct vision. Throughout the procedure,                         the patient's blood pressure, pulse, and oxygen                         saturations were monitored continuously. The Endoscope                         was introduced through the mouth, and advanced to the  third part of duodenum. The upper GI endoscopy was                         accomplished with ease. The patient tolerated the                         procedure well. Findings:      The esophagus was normal.      The examined duodenum was normal.      One non-bleeding superficial gastric ulcer with a  clean ulcer base       (Forrest Class III) was found in the prepyloric region of the stomach.       The lesion was 10 mm in largest dimension.      The cardia and gastric fundus were normal on retroflexion.      The exam was otherwise without abnormality. Impression:            - Normal esophagus.                        - Normal examined duodenum.                        - Non-bleeding gastric ulcer with a clean ulcer base                         (Forrest Class III).                        - The examination was otherwise normal.                        - No specimens collected. Recommendation:        - Return patient to hospital ward for ongoing care.                        - Advance diet as tolerated.                        - Continue present medications.                        - Use Prilosec (omeprazole) 40 mg PO daily for 12                         weeks.                        - Repeat upper endoscopy in 8 weeks to evaluate the                         response to therapy.                        - Return to GI office in 3 weeks. Procedure Code(s):     --- Professional ---                        [phone], Esophagogastroduodenoscopy, flexible,                         transoral; diagnostic, including collection  of                         specimen(s) by brushing or washing, when performed                         (separate procedure) Diagnosis Code(s):     --- Professional ---                        K25.9, Gastric ulcer, unspecified as acute or chronic,                         without hemorrhage or perforation                        K92.1, Melena (includes Hematochezia) CPT copyright 2019 American Medical Association. All rights reserved. The codes documented in this report are preliminary and upon coder review may  be revised to meet current compliance requirements. Jonathon Bellows, MD Jonathon Bellows MD, MD 12/23/2019 11:31:41 AM This report has been signed electronically. Number of Addenda:  0 Note Initiated On: 12/23/2019 10:45 AM Estimated Blood Loss:  Estimated blood loss: none.      [facility]

## 2019-12-23 NOTE — Progress Notes (Signed)
Patient get up to use the restroom, becomes dizzy and hypotensive.  Rapid response was called by nursing staff.   When seen at bedside patient was feeling little better, she was in  Trendelenburg position. Blood pressure improved from 70 systolic to high 91B with 500 cc bolus. Remained in normal sinus rhythm.  Denies any chest pain or shortness of breath.  Give her one-time dose of midodrine 10 mg. We can repeat 500 cc bolus if needed. Advised nursing staff to hold all antihypertensives.

## 2019-12-23 NOTE — Transfer of Care (Signed)
Immediate Anesthesia Transfer of Care Note  Patient: Shawnte B Totzke  Procedure(s) Performed: ESOPHAGOGASTRODUODENOSCOPY (EGD) WITH PROPOFOL (N/A )  Patient Location: Endoscopy Unit  Anesthesia Type:General  Level of Consciousness: awake, drowsy and patient cooperative  Airway & Oxygen Therapy: Patient Spontanous Breathing  Post-op Assessment: Report given to RN and Post -op Vital signs reviewed and stable  Post vital signs: Reviewed and stable  Last Vitals:  Vitals Value Taken Time  BP 134/76 12/23/19 1133  Temp    Pulse 102 12/23/19 1135  Resp 19 12/23/19 1135  SpO2 94 % 12/23/19 1135  Vitals shown include unvalidated device data.  Last Pain:  Vitals:   12/23/19 1106  TempSrc: Temporal  PainSc: 0-No pain         Complications: No complications documented.

## 2019-12-23 NOTE — Consult Note (Signed)
Jonathon Bellows , MD [address] [place], [place], [place], [id number] [address] [place], [place], [place], [address] Phone: [phone]  Fax: [phone]  Consultation  Referring Provider:    Dr Veverly Fells  Primary Care Physician:  Dion Body, MD Primary Gastroenterologist:  None          Reason for Consultation:     Melena  Date of Admission:  12/22/2019 Date of Consultation:  12/23/2019         HPI:   Tammie [name] is a 79 y.o. female admitted last eveningf with melena ongoing for 3 days , no abdominal pain, no hematemesis, no NSAID use , on asprin 81 mg , last bowel movement was yesterday and was black in color. On admission Hb 9.2 grams down from 12.4 baseline . On PPI. Recently discharged on July 6th with heart failure  Past Medical History:  Diagnosis Date  . Cancer ([place])    skin  . Diabetes mellitus without complication ([place])   . Hypertension     Past Surgical History:  Procedure Laterality Date  . ABDOMINAL HYSTERECTOMY    . BREAST BIOPSY Left 2013   CORE W/CLIP - NEG    Prior to Admission medications   Medication Sig Start Date End Date Taking? Authorizing Provider  aspirin EC 81 MG tablet Take 81 mg by mouth daily.   Yes [provider]  furosemide (LASIX) 20 MG tablet Take 20 mg by mouth daily as needed. 09/25/19  Yes [provider]  glimepiride (AMARYL) 2 MG tablet Take 2 mg by mouth daily with breakfast.    Yes [provider]  insulin detemir (LEVEMIR) 100 UNIT/ML injection Inject 0.1 mLs (10 Units total) into the skin 2 (two) times daily. 12/16/19  Yes Ezekiel Slocumb, DO  levothyroxine (SYNTHROID) 75 MCG tablet Take 75 mcg by mouth daily. 10/24/19  Yes [provider]  lisinopril (ZESTRIL) 40 MG tablet Take 40 mg by mouth daily.   Yes [provider]  lovastatin (MEVACOR) 20 MG tablet Take 20 mg by mouth at bedtime. 10/24/19  Yes [provider]  metFORMIN (GLUCOPHAGE) 1000 MG tablet  Take 1,000 mg by mouth 2 (two) times daily. 10/24/19  Yes [provider]  naproxen sodium (ALEVE) 220 MG tablet Take 220 mg by mouth 2 (two) times daily as needed (pain).   Yes [provider]  omeprazole (PRILOSEC) 20 MG capsule Take 20 mg by mouth daily.   Yes [provider]  potassium chloride SA (KLOR-CON) 20 MEQ tablet Take 2 tablets (40 mEq total) by mouth daily. 12/17/19  Yes Nicole Kindred A, DO  spironolactone (ALDACTONE) 25 MG tablet Take 1 tablet (25 mg total) by mouth daily. 12/17/19  Yes Nicole Kindred A, DO  torsemide (DEMADEX) 20 MG tablet Take 2 tablets (40 mg total) by mouth 2 (two) times daily. 12/16/19  Yes Nicole Kindred A, DO  Calcium 600-400 MG-UNIT CHEW Chew 1 tablet by mouth 2 (two) times daily.    [provider]  carvedilol (COREG) 6.25 MG tablet Take 6.25 mg by mouth 2 (two) times daily with a meal. Patient not taking: Reported on 12/22/2019    [provider]    Family History  Problem Relation Age of Onset  . Breast cancer Other   . Breast cancer Sister 31     Social History   Tobacco Use  . Smoking status: Never Smoker  . Smokeless tobacco: Never Used  Substance Use Topics  .  Alcohol use: Not Currently  . Drug use: Not on file    Allergies as of 12/22/2019 - Review Complete 12/22/2019  Allergen Reaction Noted  . Ampicillin Hives 12/02/2019  . Contrast media [iodinated diagnostic agents] Hives 12/02/2019  . Penicillin g Hives 12/02/2019  . Sulfamethoxazole-trimethoprim Hives 12/02/2019  . Tetracyclines & related Hives 12/02/2019  . Verapamil Hives 12/02/2019  . Amlodipine Rash 12/02/2019    Review of Systems:    All systems reviewed and negative except where noted in HPI.   Physical Exam:  Vital signs in last 24 hours: Temp:  [98.1 F (36.7 C)-98.4 F (36.9 C)] 98.2 F (36.8 C) (07/13 0824) Pulse Rate:  [84-109] 95 (07/13 0824) Resp:  [15-19] 18 (07/13 0824) BP: (114-156)/(65-96) 125/68 (07/13  0824) SpO2:  [94 %-99 %] 96 % (07/13 0824) Weight:  [90.9 kg] 90.9 kg (07/12 1310) Last BM Date: 12/22/19 General:   Pleasant, cooperative in NAD Head:  Normocephalic and atraumatic. Eyes:   No icterus.   Conjunctiva pink. PERRLA. Lungs: Respirations even and unlabored. Lungs clear to auscultation bilaterally.   No wheezes, crackles, or rhonchi.  Heart:  Regular rate and rhythm;  Without murmur, clicks, rubs or gallops Abdomen:  Soft, nondistended, nontender. Normal bowel sounds. No appreciable masses or hepatomegaly.  No rebound or guarding.  Neurologic:  Alert and oriented x3;  grossly normal neurologically. Psych:  Alert and cooperative. Normal affect.  LAB RESULTS: Recent Labs    12/22/19 1719 12/22/19 1719 12/22/19 2210 12/23/19 0425 12/23/19 0954  WBC 15.8*  --   --   --   --   HGB 9.2*   < > 8.8* 8.5* 8.4*  HCT 29.3*   < > 27.6* 27.2* 27.0*  PLT 452*  --   --   --   --    < > = values in this interval not displayed.   BMET Recent Labs    12/22/19 1719 12/23/19 0425  NA 133* 136  K 4.5 4.2  CL 91* 93*  CO2 30 29  GLUCOSE 155* 174*  BUN 109* 100*  CREATININE 1.82* 1.70*  CALCIUM 9.2 9.1   LFT No results for input(s): PROT, ALBUMIN, AST, ALT, ALKPHOS, BILITOT, BILIDIR, IBILI in the last 72 hours. PT/INR Recent Labs    12/22/19 1719  LABPROT 13.2  INR 1.0    STUDIES: DG Chest Portable 1 View  Result Date: 12/22/2019 CLINICAL DATA:  Short of breath, dark stools, anemia EXAM: PORTABLE CHEST 1 VIEW COMPARISON:  12/11/2019 FINDINGS: Single frontal view of the chest demonstrates a stable cardiac silhouette. There is minimal residual left basilar consolidation and effusion, with marked improvement since prior study. Right chest is clear. No pneumothorax. Cardiac silhouette is unremarkable. IMPRESSION: 1. Minimal residual left basilar consolidation and effusion, with marked improvement since prior exam. Electronically Signed   By: Randa Ngo M.D.   On: 12/22/2019  17:36      Impression / Plan:   Tammie WENIGER is a 79 y.o. y/o female admitted with melena, elevated BUN/Cr ratio suggestive of an upper GI bleed.   Plan  1. IV PPI 2. EGD  I have discussed alternative options, risks & benefits,  which include, but are not limited to, bleeding, infection, perforation,respiratory complication & drug reaction.  The patient agrees with this plan & written consent will be obtained.     Thank you for involving me in the care of this patient.      LOS: 1 day   Jonathon Bellows,  MD  12/23/2019, 10:27 AM

## 2019-12-23 NOTE — TOC Initial Note (Signed)
Transition of Care [facility]) - Initial/Assessment Note    Patient Details  Name: Tammie [name] MRN: [id number] Date of Birth: 1941-06-01  [facility] CM/SW Contact:    Elease Hashimoto, LCSW Phone Number: 12/23/2019, 9:55 AM  Clinical Narrative:   Readmission screen completed. Pt was recently here 7/6 and is here now for GI work up, something different. She was staying with her younger son-Brad and his wife how is a Therapist, sports and works from home so someone is there 24 hr per day in case she needs them. She was using a rw or cane and is new to home O2. She had home health via [facility]. She had son's transporting her to her appointments due to she has not started driving again. She was mod-I with her ADL's and mobility. Will await GI work up and see if further needs. Continue to follow while here.                Expected Discharge Plan: [place] Barriers to Discharge: Continued Medical Work up   Patient Goals and CMS Choice Patient states their goals for this hospitalization and ongoing recovery are:: I am here for somehting else now. Working me up for GI bleed CMS Medicare.gov Compare Post Acute Care list provided to:: Patient Choice offered to / list presented to : Patient  Expected Discharge Plan and Services Expected Discharge Plan: [place] In-house Referral: Clinical Social Work   Post Acute Care Choice: [place] arrangements for the past 2 months: [place]                                      Prior Living Arrangements/Services Living arrangements for the past 2 months: Single Family Home Lives with:: Adult Children (Staying with son and daughter in-law since DC 7/6) Patient language and need for interpreter reviewed:: No Do you feel safe going back to the place where you live?: Yes      Need for Family Participation in Patient Care: Yes (Comment) Care giver support system in place?: Yes (comment)    Criminal Activity/Legal Involvement Pertinent to Current Situation/Hospitalization: No - Comment as needed  Activities of Daily Living Home Assistive Devices/Equipment: Bedside commode/3-in-1, Eyeglasses, Grab bars in shower, Shower chair with back ADL Screening (condition at time of admission) Patient's cognitive ability adequate to safely complete daily activities?: Yes Is the patient deaf or have difficulty hearing?: No Does the patient have difficulty seeing, even when wearing glasses/contacts?: No Does the patient have difficulty concentrating, remembering, or making decisions?: No Patient able to express need for assistance with ADLs?: Yes Does the patient have difficulty dressing or bathing?: No Independently performs ADLs?: Yes (appropriate for developmental age) Does the patient have difficulty walking or climbing stairs?: No Weakness of Legs: None Weakness of Arms/Hands: None  Permission Sought/Granted Permission sought to share information with : Family Supports, Chartered certified accountant granted to share information with : Yes, Verbal Permission Granted  Share Information with NAME: brad and Roselyn Reef  Permission granted to share info w AGENCY: [facility]  Permission granted to share info w Relationship: sons     Emotional Assessment Appearance:: Appears stated age Attitude/Demeanor/Rapport: Engaged Affect (typically observed): Adaptable, Accepting Orientation: : Oriented to Self, Oriented to Place, Oriented to  Time, Oriented to Situation Alcohol / Substance Use: Not Applicable Psych Involvement: No (comment)  Admission  diagnosis:  Gastrointestinal hemorrhage with melena [K92.1] Patient Active Problem List   Diagnosis Date Noted  . Melena 12/22/2019  . Gastrointestinal hemorrhage with melena 12/22/2019  . AKI (acute kidney injury) ([place]) 12/22/2019  . Pressure injury of skin 12/16/2019  . Hypercarbia 12/16/2019  . Demand ischemia ([place])   . Acute on  chronic heart failure with preserved ejection fraction (HFpEF) ([place]) 12/10/2019  . Elevated troponin 12/10/2019  . Type 2 diabetes mellitus ([place]) 12/10/2019  . Essential hypertension 12/10/2019  . Heart failure with preserved ejection fraction ([place])   . Acute respiratory failure with hypoxia and hypercapnia ([place]) 12/08/2019   PCP:  Dion Body, MD Pharmacy:   [id number] - [place], [place] - [address] [address] Phone: [phone] Fax: [phone]     Social Determinants of Health (SDOH) Interventions    Readmission Risk Interventions No flowsheet data found.

## 2019-12-23 NOTE — Progress Notes (Signed)
PROGRESS NOTE    Tammie [name]  ZOX:[id number] DOB: 1941/01/02 DOA: 12/22/2019 PCP: Dion Body, MD   Brief Narrative:  Tammie [name] is a 79 y.o. female with medical history significant of DM, HTN, recent admission for acute respiratory failure d/c'd July 6th, 2021. She developed black, tarry stools on Friday.  She was sent to ED from her primary care physician.  Patient is just on baby aspirin, used to take some Aleve intermittently for pain but no NSAID for the past 2 to 3 weeks. Underwent EGD which was positive for a nonbleeding gastric ulcer.  Subjective: Patient did not had any bowel movement since came to the hospital.  She was having black tarry stool since Friday, 2 to 3/day.  Denies any abdominal pain.  Denies any recent NSAIDs use.  She does take baby aspirin daily.  She used to take Aleve for pain intermittently but denies any use over the past 2 to 3 weeks.  Assessment & Plan:   Active Problems:   Heart failure with preserved ejection fraction (HCC)   Type 2 diabetes mellitus (HCC)   Essential hypertension   Melena   Gastrointestinal hemorrhage with melena   AKI (acute kidney injury) ([place])  Melena/upper GI bleed.  Patient had a 3 g drop in hemoglobin with increase in BUN and creatinine which is more consistent with GI bleed.  She was taken for EGD with GI today and found to have a nonbleeding, clear base gastric ulcer. -GI is recommending continuation of IV Protonix for another day. -She will need Prilosec 40 mg daily for next 8 weeks and will follow up with GI and 4 weeks. -Continue to monitor hemoglobin. -Possible use if below 7.  HFpEF.  Seems stable, no acute concern. -Continue home meds.  Diabetes mellitus.  -Continue with SSI  Hypertension.  Blood pressure within goal. -Continue home meds.  AKI.  Most likely secondary to GI bleed and volume depletion.  she did received some fluid overnight. -Continue to monitor. -Avoid  nephrotoxins. -Careful with IV fluid as patient with recent discharge from hospital due to acute on chronic heart failure.  Objective: Vitals:   12/23/19 1106 12/23/19 1133 12/23/19 1143 12/23/19 1325  BP: (!) 106/50 134/76 128/71 (!) 109/52  Pulse: 91   91  Resp: 16  19 18   Temp: (!) 97.3 F (36.3 C)   98 F (36.7 C)  TempSrc: Temporal   Oral  SpO2: 96%   100%  Weight: 90.9 kg     Height: 5\' 4"  (1.626 m)       Intake/Output Summary (Last 24 hours) at 12/23/2019 1432 Last data filed at 12/23/2019 1343 Gross per 24 hour  Intake 580 ml  Output --  Net 580 ml   Filed Weights   12/22/19 1310 12/23/19 1106  Weight: 90.9 kg 90.9 kg    Examination:  General exam: Appears calm and comfortable  Respiratory system: Clear to auscultation. Respiratory effort normal. Cardiovascular system: S1 & S2 heard, RRR. No JVD, murmurs, Gastrointestinal system: Soft, nontender, nondistended, bowel sounds positive. Central nervous system: Alert and oriented. No focal neurological deficits.Symmetric 5 x 5 power. Extremities: Trace LE edema, no cyanosis, pulses intact and symmetrical. Psychiatry: Judgement and insight appear normal. Mood & affect appropriate.    DVT prophylaxis: SCDs Code Status: Full Family Communication: Son was updated at bedside Disposition Plan:  Status is: Inpatient  Remains inpatient appropriate because:Inpatient level of care appropriate due to severity of illness   Dispo: The patient is  from: Home              Anticipated d/c is to: Home              Anticipated d/c date is: 1 day              Patient currently is not medically stable to d/c.   Consultants:   GI  Procedures:  EGD  Antimicrobials:   Data Reviewed: I have personally reviewed following labs and imaging studies  CBC: Recent Labs  Lab 12/22/19 1719 12/22/19 2210 12/23/19 0425 12/23/19 0954  WBC 15.8*  --   --   --   NEUTROABS 11.4*  --   --   --   HGB 9.2* 8.8* 8.5* 8.4*  HCT 29.3*  27.6* 27.2* 27.0*  MCV 82.5  --   --   --   PLT 452*  --   --   --    Basic Metabolic Panel: Recent Labs  Lab 12/22/19 1719 12/23/19 0425  NA 133* 136  K 4.5 4.2  CL 91* 93*  CO2 30 29  GLUCOSE 155* 174*  BUN 109* 100*  CREATININE 1.82* 1.70*  CALCIUM 9.2 9.1   GFR: Estimated Creatinine Clearance: 29.3 mL/min (A) (by C-G formula based on SCr of 1.7 mg/dL (H)). Liver Function Tests: No results for input(s): AST, ALT, ALKPHOS, BILITOT, PROT, ALBUMIN in the last 168 hours. No results for input(s): LIPASE, AMYLASE in the last 168 hours. No results for input(s): AMMONIA in the last 168 hours. Coagulation Profile: Recent Labs  Lab 12/22/19 1719  INR 1.0   Cardiac Enzymes: No results for input(s): CKTOTAL, CKMB, CKMBINDEX, TROPONINI in the last 168 hours. BNP (last 3 results) No results for input(s): PROBNP in the last 8760 hours. HbA1C: Recent Labs    12/22/19 1719  HGBA1C 7.1*   CBG: Recent Labs  Lab 12/16/19 1726 12/22/19 2235 12/23/19 0826 12/23/19 1210  GLUCAP 148* 175* 202* 168*   Lipid Profile: No results for input(s): CHOL, HDL, LDLCALC, TRIG, CHOLHDL, LDLDIRECT in the last 72 hours. Thyroid Function Tests: No results for input(s): TSH, T4TOTAL, FREET4, T3FREE, THYROIDAB in the last 72 hours. Anemia Panel: No results for input(s): VITAMINB12, FOLATE, FERRITIN, TIBC, IRON, RETICCTPCT in the last 72 hours. Sepsis Labs: No results for input(s): PROCALCITON, LATICACIDVEN in the last 168 hours.  Recent Results (from the past 240 hour(s))  SARS Coronavirus 2 by RT PCR (hospital order, performed in [facility] hospital lab) Nasopharyngeal Nasopharyngeal Swab     Status: None   Collection Time: 12/22/19  5:19 PM   Specimen: Nasopharyngeal Swab  Result Value Ref Range Status   SARS Coronavirus 2 NEGATIVE NEGATIVE Final    Comment: (NOTE) SARS-CoV-2 target nucleic acids are NOT DETECTED.  The SARS-CoV-2 RNA is generally detectable in upper and  lower respiratory specimens during the acute phase of infection. The lowest concentration of SARS-CoV-2 viral copies this assay can detect is 250 copies / mL. A negative result does not preclude SARS-CoV-2 infection and should not be used as the sole basis for treatment or other patient management decisions.  A negative result may occur with improper specimen collection / handling, submission of specimen other than nasopharyngeal swab, presence of viral mutation(s) within the areas targeted by this assay, and inadequate number of viral copies (<250 copies / mL). A negative result must be combined with clinical observations, patient history, and epidemiological information.  Fact Sheet for Patients:   StrictlyIdeas.no  Fact Sheet for  Healthcare Providers: BankingDealers.co.za  This test is not yet approved or  cleared by the [place] and has been authorized for detection and/or diagnosis of SARS-CoV-2 by FDA under an Emergency Use Authorization (EUA).  This EUA will remain in effect (meaning this test can be used) for the duration of the COVID-19 declaration under Section 564(b)(1) of the Act, 21 U.S.C. section 360bbb-3(b)(1), unless the authorization is terminated or revoked sooner.  Performed at [facility], [address] [place], [place] [address]      Radiology Studies: DG Chest Portable 1 View  Result Date: 12/22/2019 CLINICAL DATA:  Short of breath, dark stools, anemia EXAM: PORTABLE CHEST 1 VIEW COMPARISON:  12/11/2019 FINDINGS: Single frontal view of the chest demonstrates a stable cardiac silhouette. There is minimal residual left basilar consolidation and effusion, with marked improvement since prior study. Right chest is clear. No pneumothorax. Cardiac silhouette is unremarkable. IMPRESSION: 1. Minimal residual left basilar consolidation and effusion, with marked improvement since prior exam.  Electronically Signed   By: Randa Ngo M.D.   On: 12/22/2019 17:36    Scheduled Meds: . calcium-vitamin D  1 tablet Oral BID  . carvedilol  6.25 mg Oral BID WC  . insulin aspart  0-20 Units Subcutaneous TID WC  . insulin detemir  10 Units Subcutaneous BID  . levothyroxine  75 mcg Oral Q0600  . lisinopril  40 mg Oral Daily  . [START ON 12/26/2019] pantoprazole  40 mg Intravenous Q12H  . potassium chloride SA  40 mEq Oral Daily  . pravastatin  20 mg Oral q1800  . spironolactone  25 mg Oral Daily  . torsemide  40 mg Oral BID   Continuous Infusions: . pantoprozole (PROTONIX) infusion 8 mg/hr (12/23/19 0421)     LOS: 1 day   Time spent: 40 minutes.  Lorella Nimrod, MD [facility]  If 7PM-7AM, please contact night-coverage [email or web address]  12/23/2019, 2:32 PM   This record has been created using Systems analyst. Errors have been sought and corrected,but may not always be located. Such creation errors do not reflect on the standard of care.

## 2019-12-23 NOTE — Progress Notes (Signed)
Inpatient Diabetes Program Recommendations  AACE/ADA: New Consensus Statement on Inpatient Glycemic Control   Target Ranges:  Prepandial:   less than 140 mg/dL      Peak postprandial:   less than 180 mg/dL (1-2 hours)      Critically ill patients:  140 - 180 mg/dL   Results for KIANI, WURTZEL (MRN [id number]) as of 12/23/2019 08:56  Ref. Range 12/22/2019 22:35 12/23/2019 08:26  Glucose-Capillary Latest Ref Range: 70 - 99 mg/dL 175 (H) 202 (H)  Results for TAWONA, FILSINGER (MRN [id number]) as of 12/23/2019 08:56  Ref. Range 12/22/2019 17:19  Hemoglobin A1C Latest Ref Range: 4.8 - 5.6 % 7.1 (H)   Review of Glycemic Control  Diabetes history: DM2 Outpatient Diabetes medications: Levemir 10 units BID, Metformin 1000 mg BID Current orders for Inpatient glycemic control: Levemir 10 units BID, Novolog 0-20 units TID with meals  Inpatient Diabetes Program Recommendations:   Insulin-May want to consider decreasing Levemir to 10 units QHS.  HbgA1C:  A1C 7.1% on 12/22/19 indicating an average glucose of 157 mg/dl over the past 2-3 months. Goal A1C per ADA if 7% or less; therefore, current A1C indicating fairly good DM control.  NOTE: Noted consult for diabetes coordinator. Chart reviewed.  In reviewing chart, noted patient was recently inpatient 12/08/19-12/16/19 and no basal insulin was ordered (only ordered Novolog correction scale) and glucose remained fairly well controlled.   Thanks, Barnie Alderman, RN, MSN, CDE Diabetes Coordinator Inpatient Diabetes Program [phone] (Team Pager from 8am to 5pm)

## 2019-12-23 NOTE — Significant Event (Addendum)
Rapid Response Event Note  Overview: Time Called: 1519 Arrival Time: 1521 Event Type: Hypotension  Initial Focused Assessment:  Upon my arrival to room, patient is AA+Ox4. Trendelenberg position. Receiving 500 mL NS bolus. Patient is hypotensive 80s/40s.   Interventions:  - Complete 1/2 L Bolus. - Call lab to draw STAT H+H (already ordered).  - Dr. Reesa Chew paged to bedside. - 10 mg Midodrine given.   Plan of Care (if not transferred):  - Reassess @ 1645 hrs.  Event Summary: Name of Physician Notified: Dr. Reesa Chew at 1524    at    Outcome: Stayed in room and stabalized     Tammie [name]

## 2019-12-23 NOTE — Anesthesia Procedure Notes (Signed)
Procedure Name: General with mask airway Performed by: Fletcher-Harrison, Garnett Rekowski, CRNA Pre-anesthesia Checklist: Patient identified, Emergency Drugs available, Suction available and Patient being monitored Patient Re-evaluated:Patient Re-evaluated prior to induction Oxygen Delivery Method: Simple face mask Induction Type: IV induction Placement Confirmation: positive ETCO2 and CO2 detector Dental Injury: Teeth and Oropharynx as per pre-operative assessment        

## 2019-12-23 NOTE — Anesthesia Postprocedure Evaluation (Signed)
Anesthesia Post Note  Patient: Tammie [name]  Procedure(s) Performed: ESOPHAGOGASTRODUODENOSCOPY (EGD) WITH PROPOFOL (N/A )  Patient location during evaluation: Endoscopy Anesthesia Type: General Level of consciousness: awake and alert Pain management: pain level controlled Vital Signs Assessment: post-procedure vital signs reviewed and stable Respiratory status: spontaneous breathing, nonlabored ventilation, respiratory function stable and patient connected to nasal cannula oxygen Cardiovascular status: blood pressure returned to baseline and stable Postop Assessment: no apparent nausea or vomiting Anesthetic complications: no   No complications documented.   Last Vitals:  Vitals:   12/23/19 1143 12/23/19 1325  BP: 128/71 (!) 109/52  Pulse:  91  Resp: 19 18  Temp:  36.7 C  SpO2:  100%    Last Pain:  Vitals:   12/23/19 1325  TempSrc: Oral  PainSc:                  Arita Miss

## 2019-12-24 ENCOUNTER — Encounter: Payer: Self-pay | Admitting: Gastroenterology

## 2019-12-24 LAB — GLUCOSE, CAPILLARY
Glucose-Capillary: 105 mg/dL — ABNORMAL HIGH (ref 70–99)
Glucose-Capillary: 195 mg/dL — ABNORMAL HIGH (ref 70–99)
Glucose-Capillary: 264 mg/dL — ABNORMAL HIGH (ref 70–99)

## 2019-12-24 LAB — TYPE AND SCREEN
ABO/RH(D): B NEG
Antibody Screen: NEGATIVE
Unit division: 0

## 2019-12-24 LAB — BASIC METABOLIC PANEL
Anion gap: 8 (ref 5–15)
BUN: 85 mg/dL — ABNORMAL HIGH (ref 8–23)
CO2: 31 mmol/L (ref 22–32)
Calcium: 8.5 mg/dL — ABNORMAL LOW (ref 8.9–10.3)
Chloride: 99 mmol/L (ref 98–111)
Creatinine, Ser: 2.42 mg/dL — ABNORMAL HIGH (ref 0.44–1.00)
GFR calc Af Amer: 21 mL/min — ABNORMAL LOW (ref >60)
GFR calc non Af Amer: 18 mL/min — ABNORMAL LOW (ref >60)
Glucose, Bld: 110 mg/dL — ABNORMAL HIGH (ref 70–99)
Potassium: 3.9 mmol/L (ref 3.5–5.1)
Sodium: 138 mmol/L (ref 135–145)

## 2019-12-24 LAB — BPAM RBC
Blood Product Expiration Date: 202108122359
ISSUE DATE / TIME: 202107132345
Unit Type and Rh: 1700

## 2019-12-24 LAB — CBC
HCT: 27.7 % — ABNORMAL LOW (ref 36.0–46.0)
Hemoglobin: 8.6 g/dL — ABNORMAL LOW (ref 12.0–15.0)
MCH: 26.4 pg (ref 26.0–34.0)
MCHC: 31 g/dL (ref 30.0–36.0)
MCV: 85 fL (ref 80.0–100.0)
Platelets: 400 10*3/uL (ref 150–400)
RBC: 3.26 MIL/uL — ABNORMAL LOW (ref 3.87–5.11)
RDW: 16 % — ABNORMAL HIGH (ref 11.5–15.5)
WBC: 10.2 10*3/uL (ref 4.0–10.5)
nRBC: 0 % (ref 0.0–0.2)

## 2019-12-24 NOTE — TOC Progression Note (Addendum)
Transition of Care [facility]) - Progression Note    Patient Details  Name: CONYA ELLINWOOD MRN: [id number] Date of Birth: Nov 27, 1940  [facility] CM/SW [place], [place] Phone Number: [phone] 12/24/2019, 4:50 PM  Clinical Narrative:     Pt. expected d/c tomorrow 7/15.  Active with [facility],  this CSW called and left voicemail for [facility], to update on patient disposition.  Expected Discharge Plan: [place] Barriers to Discharge: Continued Medical Work up  Expected Discharge Plan and Services Expected Discharge Plan: [place] In-house Referral: Clinical Social Work   Post Acute Care Choice: [place] arrangements for the past 2 months: Single Family Home                                       Social Determinants of Health (SDOH) Interventions    Readmission Risk Interventions No flowsheet data found.

## 2019-12-24 NOTE — Progress Notes (Signed)
Jonathon Bellows , MD [address], [place], [place], [place], [address] [address], [place], [place], [place], [address] Phone: [phone]  Fax: [phone]   Tammie [name] is being followed for GI bleed  Day 1 of follow up   Subjective: Feels well, had a brown bowel movement   Objective: Vital signs in last 24 hours: Vitals:   12/24/19 0900 12/24/19 0915 12/24/19 0930 12/24/19 0945  BP: (!) 109/42 (!) 112/42 (!) 108/46 (!) 113/44  Pulse: 79 78 72 75  Resp: 17 18 (!) 21 19  Temp:      TempSrc:      SpO2: 98% 95% 97% 97%  Weight:      Height:       Weight change: 0 kg  Intake/Output Summary (Last 24 hours) at 12/24/2019 1050 Last data filed at 12/24/2019 0900 Gross per 24 hour  Intake 817.77 ml  Output 1150 ml  Net -332.23 ml     Exam:  Abdomen: soft, nontender, normal bowel sounds   Lab Results: @LABTEST2 @ Micro Results: Recent Results (from the past 240 hour(s))  SARS Coronavirus 2 by RT PCR (hospital order, performed in [facility] hospital lab) Nasopharyngeal Nasopharyngeal Swab     Status: None   Collection Time: 12/22/19  5:19 PM   Specimen: Nasopharyngeal Swab  Result Value Ref Range Status   SARS Coronavirus 2 NEGATIVE NEGATIVE Final    Comment: (NOTE) SARS-CoV-2 target nucleic acids are NOT DETECTED.  The SARS-CoV-2 RNA is generally detectable in upper and lower respiratory specimens during the acute phase of infection. The lowest concentration of SARS-CoV-2 viral copies this assay can detect is 250 copies / mL. A negative result does not preclude SARS-CoV-2 infection and should not be used as the sole basis for treatment or other patient management decisions.  A negative result may occur with improper specimen collection / handling, submission of specimen other than nasopharyngeal swab, presence of viral mutation(s) within the areas targeted by this assay, and inadequate number of viral copies (<250 copies / mL). A negative result  must be combined with clinical observations, patient history, and epidemiological information.  Fact Sheet for Patients:   StrictlyIdeas.no  Fact Sheet for Healthcare Providers: BankingDealers.co.za  This test is not yet approved or  cleared by the [place] FDA and has been authorized for detection and/or diagnosis of SARS-CoV-2 by FDA under an Emergency Use Authorization (EUA).  This EUA will remain in effect (meaning this test can be used) for the duration of the COVID-19 declaration under Section 564(b)(1) of the Act, 21 U.S.C. section 360bbb-3(b)(1), unless the authorization is terminated or revoked sooner.  Performed at [facility], [address] [place], [place] [address]    Studies/Results: DG Chest Portable 1 View  Result Date: 12/22/2019 CLINICAL DATA:  Short of breath, dark stools, anemia EXAM: PORTABLE CHEST 1 VIEW COMPARISON:  12/11/2019 FINDINGS: Single frontal view of the chest demonstrates a stable cardiac silhouette. There is minimal residual left basilar consolidation and effusion, with marked improvement since prior study. Right chest is clear. No pneumothorax. Cardiac silhouette is unremarkable. IMPRESSION: 1. Minimal residual left basilar consolidation and effusion, with marked improvement since prior exam. Electronically Signed   By: Randa Ngo M.D.   On: 12/22/2019 17:36   Medications: I have reviewed the patient's current medications. Scheduled Meds: . sodium chloride   Intravenous Once  . calcium-vitamin D  1 tablet Oral BID  . Chlorhexidine Gluconate Cloth  6 each Topical Daily  .  insulin aspart  0-20 Units Subcutaneous TID WC  . insulin detemir  10 Units Subcutaneous BID  . levothyroxine  75 mcg Oral Q0600  . [START ON 12/26/2019] pantoprazole  40 mg Intravenous Q12H  . potassium chloride SA  40 mEq Oral Daily  . pravastatin  20 mg Oral q1800   Continuous Infusions: . norepinephrine  (LEVOPHED) Adult infusion 3 mcg/min (12/24/19 0900)  . pantoprozole (PROTONIX) infusion 8 mg/hr (12/23/19 0810)  . sodium chloride     PRN Meds:.   Assessment: Active Problems:   Heart failure with preserved ejection fraction (HCC)   Type 2 diabetes mellitus (HCC)   Essential hypertension   Melena   Gastrointestinal hemorrhage with melena   AKI (acute kidney injury) (HCC)  Tammie [name] is a 79 y.o. y/o female admitted with melena, elevated BUN/Cr ratio EGD performed on 12/23/2019 demonstrated clear based nonbleeding gastric ulcer.  No blood was seen anywhere in the stomach or small bowel.  Overnight was hypotensive and drop in hemoglobin noted to 6.9 g.  No mention has been made of any melena.  Status post transfusion hemoglobin this morning is 8.6 g.  Creatinine bumped up to 2.42 suggestive of AKI.This morning had a brown bowel movement    Plan  1.  If there is concern for a GI bleed evidenced by hematochezia or melena suggest to obtain a tagged RBC scan.?  Bleeding elsewhere from the GI tract.  Do not check stool for occult blood which is a test for colon cancer screening and is inappropriate in an inpatient setting and not for GI bleeding.  GI bleeding is determined by the presence of visible blood in the stool with hematochezia or melena only .  2.  Continue IV PPI 3.  Check H. pylori stool antigen when stable 4. Advance diet if Hb is stable.     LOS: 2 days   Jonathon Bellows, MD 12/24/2019, 10:50 AM

## 2019-12-24 NOTE — Progress Notes (Signed)
Patient remains AOx4.  She was helped up to the chair earlier this morning.  She has consumed her meals roughly 90%.  Her son has come to visit her at the bedside twice this shift.  She received insulin per her sliding scale.  She has not complained of pain.  Her breathing is clear.  Her HR is NSR but her pressure is soft with MAP ranging around 60-63mmHg.  Levo 53mcg/min have been infusing for majority of the shift.  Hospitalist rounded and asked to wean patient from LEVO but pressures remain soft today.  PAC's marked on monitor.  UOP is WDL.

## 2019-12-24 NOTE — Progress Notes (Signed)
PROGRESS NOTE    Tammie [name]  WPY:[id number] DOB: 02-09-1941 DOA: 12/22/2019 PCP: Dion Body, MD   Brief Narrative:  Tammie [name] is a 79 y.o. female with medical history significant of DM, HTN, recent admission for acute respiratory failure d/c'd July 6th, 2021. She developed black, tarry stools on Friday.  She was sent to ED from her primary care physician.  Patient is just on baby aspirin, used to take some Aleve intermittently for pain but no NSAID for the past 2 to 3 weeks. Underwent EGD which was positive for a nonbleeding gastric ulcer.  Subjective: Patient had a normal brown color bowel movement this morning.  She was feeling better when seen today.  No dizziness.  Patient was moved to stepdown unit and was on pressors due to persistent hypotension overnight.  Assessment & Plan:   Active Problems:   Heart failure with preserved ejection fraction (HCC)   Type 2 diabetes mellitus (HCC)   Essential hypertension   Melena   Gastrointestinal hemorrhage with melena   AKI (acute kidney injury) (HCC)  Hypotension.  Patient developed hypotension after getting EGD.  Minimally responsive to IV bolus.  She was moved to stepdown unit and started on Levophed with improvement in her blood pressure.  Denies any dizziness. -Try weaning her off from Levophed.  Melena/upper GI bleed.  Patient had a 3 g drop in hemoglobin with increase in BUN and creatinine which is more consistent with GI bleed.  She was taken for EGD with GI  and found to have a nonbleeding, clear base gastric ulcer.  She also received 1 unit of PRBC yesterday evening due to decreased in hemoglobin to 6.9.  Hemoglobin improved to 8.6 this morning. -Continue IV Protonix while in the hospital. -She will need Prilosec 40 mg daily for next 8 weeks and will follow up with GI and 4 weeks. -Continue to monitor hemoglobin. -Transfuse if below 7.  HFpEF.  Seems stable, no acute concern. -Continue home meds,  holding currently due to softer blood pressure.  Diabetes mellitus.  -Continue with SSI  Hypertension.  Blood pressure well soft, patient is on Levophed. -Holding home antihypertensives. . AKI.  Most likely secondary to GI bleed and volume depletion.  she did received some fluid yesterday due to softer blood pressure. -Continue to monitor. -Avoid nephrotoxins. -Careful with IV fluid as patient with recent discharge from hospital due to acute on chronic heart failure.  Objective: Vitals:   12/24/19 0900 12/24/19 0915 12/24/19 0930 12/24/19 0945  BP: (!) 109/42 (!) 112/42 (!) 108/46 (!) 113/44  Pulse: 79 78 72 75  Resp: 17 18 (!) 21 19  Temp:      TempSrc:      SpO2: 98% 95% 97% 97%  Weight:      Height:        Intake/Output Summary (Last 24 hours) at 12/24/2019 1339 Last data filed at 12/24/2019 0900 Gross per 24 hour  Intake 717.77 ml  Output 1150 ml  Net -432.23 ml   Filed Weights   12/22/19 1310 12/23/19 1106  Weight: 90.9 kg 90.9 kg    Examination:  General exam: Well-developed pleasant lady, sitting in chair, in no acute distress. Respiratory system: Clear to auscultation. Respiratory effort normal. Cardiovascular system: Regular rate and rhythm, no murmur or rubs Gastrointestinal system: Soft, nontender, no distention, BS positive. Central nervous system: Alert and oriented. No focal neurological deficits. Extremities: No LE edema, no cyanosis, pulses intact and symmetrical. Psychiatry: Judgement and insight appear  normal. Mood & affect appropriate.    DVT prophylaxis: SCDs Code Status: Full Family Communication: Son was updated on phone. Disposition Plan:  Status is: Inpatient  Remains inpatient appropriate because:Inpatient level of care appropriate due to severity of illness   Dispo: The patient is from: Home              Anticipated d/c is to: Home              Anticipated d/c date is: 1 day              Patient currently is not medically stable to  d/c.  Patient developed persistent hypotension after EGD requiring pressor.   Consultants:   GI  Procedures:  EGD  Antimicrobials:   Data Reviewed: I have personally reviewed following labs and imaging studies  CBC: Recent Labs  Lab 12/22/19 1719 12/22/19 2210 12/23/19 0425 12/23/19 0954 12/23/19 1605 12/23/19 2200 12/24/19 0436  WBC 15.8*  --   --   --   --   --  10.2  NEUTROABS 11.4*  --   --   --   --   --   --   HGB 9.2*   < > 8.5* 8.4* 8.0* 6.9* 8.6*  HCT 29.3*   < > 27.2* 27.0* 26.1* 22.2* 27.7*  MCV 82.5  --   --   --   --   --  85.0  PLT 452*  --   --   --   --   --  400   < > = values in this interval not displayed.   Basic Metabolic Panel: Recent Labs  Lab 12/22/19 1719 12/23/19 0425 12/24/19 0436  NA 133* 136 138  K 4.5 4.2 3.9  CL 91* 93* 99  CO2 30 29 31   GLUCOSE 155* 174* 110*  BUN 109* 100* 85*  CREATININE 1.82* 1.70* 2.42*  CALCIUM 9.2 9.1 8.5*   GFR: Estimated Creatinine Clearance: 20.6 mL/min (A) (by C-G formula based on SCr of 2.42 mg/dL (H)). Liver Function Tests: No results for input(s): AST, ALT, ALKPHOS, BILITOT, PROT, ALBUMIN in the last 168 hours. No results for input(s): LIPASE, AMYLASE in the last 168 hours. No results for input(s): AMMONIA in the last 168 hours. Coagulation Profile: Recent Labs  Lab 12/22/19 1719  INR 1.0   Cardiac Enzymes: No results for input(s): CKTOTAL, CKMB, CKMBINDEX, TROPONINI in the last 168 hours. BNP (last 3 results) No results for input(s): PROBNP in the last 8760 hours. HbA1C: Recent Labs    12/22/19 1719  HGBA1C 7.1*   CBG: Recent Labs  Lab 12/23/19 1527 12/23/19 1705 12/23/19 2158 12/24/19 0712 12/24/19 1137  GLUCAP 151* 128* 108* 105* 195*   Lipid Profile: No results for input(s): CHOL, HDL, LDLCALC, TRIG, CHOLHDL, LDLDIRECT in the last 72 hours. Thyroid Function Tests: No results for input(s): TSH, T4TOTAL, FREET4, T3FREE, THYROIDAB in the last 72 hours. Anemia Panel: No  results for input(s): VITAMINB12, FOLATE, FERRITIN, TIBC, IRON, RETICCTPCT in the last 72 hours. Sepsis Labs: No results for input(s): PROCALCITON, LATICACIDVEN in the last 168 hours.  Recent Results (from the past 240 hour(s))  SARS Coronavirus 2 by RT PCR (hospital order, performed in [facility] hospital lab) Nasopharyngeal Nasopharyngeal Swab     Status: None   Collection Time: 12/22/19  5:19 PM   Specimen: Nasopharyngeal Swab  Result Value Ref Range Status   SARS Coronavirus 2 NEGATIVE NEGATIVE Final    Comment: (NOTE) SARS-CoV-2 target nucleic acids  are NOT DETECTED.  The SARS-CoV-2 RNA is generally detectable in upper and lower respiratory specimens during the acute phase of infection. The lowest concentration of SARS-CoV-2 viral copies this assay can detect is 250 copies / mL. A negative result does not preclude SARS-CoV-2 infection and should not be used as the sole basis for treatment or other patient management decisions.  A negative result may occur with improper specimen collection / handling, submission of specimen other than nasopharyngeal swab, presence of viral mutation(s) within the areas targeted by this assay, and inadequate number of viral copies (<250 copies / mL). A negative result must be combined with clinical observations, patient history, and epidemiological information.  Fact Sheet for Patients:   StrictlyIdeas.no  Fact Sheet for Healthcare Providers: BankingDealers.co.za  This test is not yet approved or  cleared by the [place] FDA and has been authorized for detection and/or diagnosis of SARS-CoV-2 by FDA under an Emergency Use Authorization (EUA).  This EUA will remain in effect (meaning this test can be used) for the duration of the COVID-19 declaration under Section 564(b)(1) of the Act, 21 U.S.C. section 360bbb-3(b)(1), unless the authorization is terminated or revoked sooner.  Performed at  [facility], [address], [place], [place] [address]      Radiology Studies: DG Chest Portable 1 View  Result Date: 12/22/2019 CLINICAL DATA:  Short of breath, dark stools, anemia EXAM: PORTABLE CHEST 1 VIEW COMPARISON:  12/11/2019 FINDINGS: Single frontal view of the chest demonstrates a stable cardiac silhouette. There is minimal residual left basilar consolidation and effusion, with marked improvement since prior study. Right chest is clear. No pneumothorax. Cardiac silhouette is unremarkable. IMPRESSION: 1. Minimal residual left basilar consolidation and effusion, with marked improvement since prior exam. Electronically Signed   By: Randa Ngo M.D.   On: 12/22/2019 17:36    Scheduled Meds: . sodium chloride   Intravenous Once  . calcium-vitamin D  1 tablet Oral BID  . Chlorhexidine Gluconate Cloth  6 each Topical Daily  . insulin aspart  0-20 Units Subcutaneous TID WC  . insulin detemir  10 Units Subcutaneous BID  . levothyroxine  75 mcg Oral Q0600  . [START ON 12/26/2019] pantoprazole  40 mg Intravenous Q12H  . potassium chloride SA  40 mEq Oral Daily  . pravastatin  20 mg Oral q1800   Continuous Infusions: . norepinephrine (LEVOPHED) Adult infusion 3 mcg/min (12/24/19 0900)  . pantoprozole (PROTONIX) infusion 8 mg/hr (12/23/19 0810)  . sodium chloride       LOS: 2 days   Time spent: 45 minutes.  Lorella Nimrod, MD [facility]  If 7PM-7AM, please contact night-coverage [email or web address]  12/24/2019, 1:39 PM   This record has been created using Systems analyst. Errors have been sought and corrected,but may not always be located. Such creation errors do not reflect on the standard of care.

## 2019-12-24 NOTE — Significant Event (Signed)
Patient remains hypotensive with blood pressure 79/36 mm Hg and pulse rate 71 beats/min. Patient is asymptomatic; she was alert and oriented x4, and in no acute distress. Rapid response was called earlier in the day hypertension and patient received 250 cc bolus and transferred to SDU for BP management.  Labs reporting hemoglobin 6.9 given persistent hypotension, will start patient low-dose Levophed to keep MAP > 65 mmHg and transfuse with 1 unit of PRBC.    Rufina Falco, BSN, MSN, DNP, [employer]  [facility] Nurse Practitioner  [facility]

## 2019-12-25 LAB — BASIC METABOLIC PANEL
Anion gap: 7 (ref 5–15)
BUN: 54 mg/dL — ABNORMAL HIGH (ref 8–23)
CO2: 29 mmol/L (ref 22–32)
Calcium: 8.7 mg/dL — ABNORMAL LOW (ref 8.9–10.3)
Chloride: 101 mmol/L (ref 98–111)
Creatinine, Ser: 1.74 mg/dL — ABNORMAL HIGH (ref 0.44–1.00)
GFR calc Af Amer: 32 mL/min — ABNORMAL LOW (ref >60)
GFR calc non Af Amer: 27 mL/min — ABNORMAL LOW (ref >60)
Glucose, Bld: 206 mg/dL — ABNORMAL HIGH (ref 70–99)
Potassium: 4.3 mmol/L (ref 3.5–5.1)
Sodium: 137 mmol/L (ref 135–145)

## 2019-12-25 LAB — CBC
HCT: 28.8 % — ABNORMAL LOW (ref 36.0–46.0)
Hemoglobin: 8.7 g/dL — ABNORMAL LOW (ref 12.0–15.0)
MCH: 25.9 pg — ABNORMAL LOW (ref 26.0–34.0)
MCHC: 30.2 g/dL (ref 30.0–36.0)
MCV: 85.7 fL (ref 80.0–100.0)
Platelets: 421 10*3/uL — ABNORMAL HIGH (ref 150–400)
RBC: 3.36 MIL/uL — ABNORMAL LOW (ref 3.87–5.11)
RDW: 16 % — ABNORMAL HIGH (ref 11.5–15.5)
WBC: 10.9 10*3/uL — ABNORMAL HIGH (ref 4.0–10.5)
nRBC: 0 % (ref 0.0–0.2)

## 2019-12-25 LAB — GLUCOSE, CAPILLARY
Glucose-Capillary: 175 mg/dL — ABNORMAL HIGH (ref 70–99)
Glucose-Capillary: 196 mg/dL — ABNORMAL HIGH (ref 70–99)
Glucose-Capillary: 131 mg/dL — ABNORMAL HIGH (ref 70–99)

## 2019-12-25 MED ORDER — POTASSIUM CHLORIDE 20 MEQ PO PACK
40.0000 meq | PACK | Freq: Once | ORAL | Status: AC
  Administered 2019-12-25: 40 meq via ORAL
  Filled 2019-12-25: qty 2

## 2019-12-25 MED ORDER — FERROUS SULFATE 325 (65 FE) MG PO TBEC
325.0000 mg | DELAYED_RELEASE_TABLET | Freq: Two times a day (BID) | ORAL | 3 refills | Status: DC
Start: 2019-12-25 — End: 2021-06-22

## 2019-12-25 MED ORDER — OMEPRAZOLE 20 MG PO CPDR: 20.0000 mg | DELAYED_RELEASE_CAPSULE | Freq: Two times a day (BID) | ORAL | 1 refills | Status: DC

## 2019-12-25 NOTE — Discharge Summary (Signed)
Physician Discharge Summary  Tammie [name] XKG:[id number] DOB: 1940-12-01 DOA: 12/22/2019  PCP: Dion Body, MD  Admit date: 12/22/2019 Discharge date: 12/25/2019  Admitted From: Home Disposition:  Home  Recommendations for Outpatient Follow-up:  1. Follow up with PCP in 1-2 weeks 2. Please obtain BMP/CBC in one week 3. Please follow up on the following pending results:None  Home Health:Yes Equipment/Devices:None Discharge Condition: Stable CODE STATUS: DNR Diet recommendation: Heart Healthy / Carb Modified   Brief/Interim Summary: Klaire B Satterfieldis a 79 y.o.femalewith medical history significant ofDM, HTN, recent admission for acute respiratory failure d/c'd July 6th, 2021. She developed black, tarry stoolson Friday.  She was sent to ED from her primary care physician.  Patient is just on baby aspirin, used to take some Aleve intermittently for pain but no NSAID for the past 2 to 3 weeks. Underwent EGD which was positive for a nonbleeding gastric ulcer.  GI advised continuation of PPI twice daily for 8 weeks and she will follow up with gastroenterology as an outpatient.  Will need a repeat EGD in 4 to 8 weeks. Globin on the day of discharge was 8.6.  He was also started on iron supplement due to acute blood loss anemia.  She received 1 unit of PRBC.  Patient developed persistent hypotension after the procedure, minimum response to fluid bolus and she was started on Levophed as we want to try not to make her volume overload due to her history of heart failure.  Patient was able to wean off from Levophed successfully.  She was feeling better and ready to go home.  She will follow up with her primary care physician.  She was advised to hold her antihypertensives for couple of days and keep monitoring her blood pressure.  She can resume her blood pressure medications once blood pressure becomes elevated.  Patient did developed some AKI most likely secondary to volume  depletion and GI bleed.  Which started improving before discharge.  She was advised to keep herself well-hydrated.  Her Aleve was discontinued on discharge.  She will continue rest of her home meds.  Discharge Diagnoses:  Active Problems:   Heart failure with preserved ejection fraction (HCC)   Type 2 diabetes mellitus (HCC)   Essential hypertension   Melena   Gastrointestinal hemorrhage with melena   AKI (acute kidney injury) [facility])  Discharge Instructions  Discharge Instructions    Diet - low sodium heart healthy   Complete by: As directed    Discharge instructions   Complete by: As directed    It was pleasure taking care of you. Please keep yourself well-hydrated and hold your blood pressure medications which include lisinopril, torsemide, spironolactone for next couple of days.  Check your blood pressure regularly and if it is started going up you can restart your blood pressure medication slowly.  Please see your primary care physician within the next few days so they can check your blood pressure and advise you how to restart your blood pressure medications. We also increased the dose of Prilosec from once daily to twice, please take it as directed for at least 8 weeks. Please follow-up with gastroenterology.   Increase activity slowly   Complete by: As directed    No wound care   Complete by: As directed      Allergies as of 12/25/2019      Reactions   Ampicillin Hives   Contrast Media [iodinated Diagnostic Agents] Hives   Penicillin G Hives   Sulfamethoxazole-trimethoprim Hives  Tetracyclines & Related Hives   Verapamil Hives   Amlodipine Rash      Medication List    STOP taking these medications   carvedilol 6.25 MG tablet Commonly known as: COREG   naproxen sodium 220 MG tablet Commonly known as: ALEVE     TAKE these medications   aspirin EC 81 MG tablet Take 81 mg by mouth daily.   Calcium 600-400 MG-UNIT Chew Chew 1 tablet by mouth 2 (two) times  daily.   ferrous sulfate 325 (65 FE) MG EC tablet Take 1 tablet (325 mg total) by mouth 2 (two) times daily.   furosemide 20 MG tablet Commonly known as: LASIX Take 20 mg by mouth daily as needed.   glimepiride 2 MG tablet Commonly known as: AMARYL Take 2 mg by mouth daily with breakfast.   insulin detemir 100 UNIT/ML injection Commonly known as: LEVEMIR Inject 0.1 mLs (10 Units total) into the skin 2 (two) times daily.   levothyroxine 75 MCG tablet Commonly known as: SYNTHROID Take 75 mcg by mouth daily.   lisinopril 40 MG tablet Commonly known as: ZESTRIL Take 40 mg by mouth daily.   lovastatin 20 MG tablet Commonly known as: MEVACOR Take 20 mg by mouth at bedtime.   metFORMIN 1000 MG tablet Commonly known as: GLUCOPHAGE Take 1,000 mg by mouth 2 (two) times daily.   omeprazole 20 MG capsule Commonly known as: PRILOSEC Take 1 capsule (20 mg total) by mouth 2 (two) times daily before a meal. What changed: when to take this   potassium chloride SA 20 MEQ tablet Commonly known as: KLOR-CON Take 2 tablets (40 mEq total) by mouth daily.   spironolactone 25 MG tablet Commonly known as: ALDACTONE Take 1 tablet (25 mg total) by mouth daily.   torsemide 20 MG tablet Commonly known as: DEMADEX Take 2 tablets (40 mg total) by mouth 2 (two) times daily.       Follow-up Information    Dion Body, MD. Schedule an appointment as soon as possible for a visit.   Specialty: Family Medicine Contact information: [place] [address] [phone]        Jonathon Bellows, MD. Schedule an appointment as soon as possible for a visit.   Specialty: Gastroenterology Contact information: [place] [address] [phone]              Allergies  Allergen Reactions  . Ampicillin Hives  . Contrast Media [Iodinated Diagnostic Agents] Hives  . Penicillin G Hives  .  Sulfamethoxazole-Trimethoprim Hives  . Tetracyclines & Related Hives  . Verapamil Hives  . Amlodipine Rash    Consultations:  Gastroenterology  Procedures/Studies: CT ABDOMEN PELVIS WO CONTRAST  Result Date: 12/09/2019 CLINICAL DATA:  Abdominal pain. Concern for probable liver failure and renal failure associated with severe hypoglycemia. EXAM: CT ABDOMEN AND PELVIS WITHOUT CONTRAST TECHNIQUE: Multidetector CT imaging of the abdomen and pelvis was performed following the standard protocol without IV contrast. COMPARISON:  None. FINDINGS: Lower chest: There are small to moderate bilateral pleural effusions. Consolidation/atelectasis of the left lower lobe is noted. Hepatobiliary: No focal liver abnormality identified. Calcified stones are identified layering within the dependent portion of the gallbladder. No biliary ductal dilatation identified. Pancreas: Unremarkable. No pancreatic ductal dilatation or surrounding inflammatory changes. Spleen: Normal in size without focal abnormality. Adrenals/Urinary Tract: Normal appearance of the adrenal glands. Bilateral kidney cysts. Incompletely characterized without IV contrast. The largest is exophytic arising from  the inferior pole of left kidney measuring 7.6 x 6.5 cm. No nephrolithiasis or hydronephrosis. The urinary bladder is normal. Stomach/Bowel: Stomach appears normal. There is no bowel wall thickening, inflammation or distension. Colonic diverticulosis noted. The appendix is visualized and appears normal. Vascular/Lymphatic: Aortic atherosclerosis. No aneurysm. No abdominopelvic adenopathy. Reproductive: Status post hysterectomy. No adnexal masses. Other: Trace ascites noted within the abdomen and pelvis. No focal fluid collections. Musculoskeletal: Skin thickening and subcutaneous fat stranding identified throughout the body wall compatible with anasarca. Mild degenerative disc disease within the thoracolumbar spine. Multi level, bilateral facet  arthropathy identified within the lumbar spine. Mild bilateral hip osteoarthritis. IMPRESSION: 1. No acute findings identified within the abdomen or pelvis. 2. Bilateral kidney cysts. Incompletely characterized without IV contrast. 3. Gallstones. 4. Small to moderate bilateral pleural effusions with left lower lobe atelectasis/consolidation. 5. Anasarca. 6. Aortic atherosclerosis. Aortic Atherosclerosis (ICD10-I70.0). Electronically Signed   By: Kerby Moors M.D.   On: 12/09/2019 09:31   DG Chest 2 View  Result Date: 12/08/2019 CLINICAL DATA:  Shortness of breath EXAM: CHEST - 2 VIEW COMPARISON:  December 02, 2019 FINDINGS: There is persistent airspace opacity with pleural effusion at the left base. There is a smaller right pleural effusion with ill-defined airspace opacity in the right base. Heart size and pulmonary vascularity are normal. No adenopathy. No bone lesions. IMPRESSION: Pleural effusions bilaterally, larger on the left than on the right ill-defined airspace opacity in the lung bases, more on the left than on the right. Changes on the right are new compared to most recent study. Changes on the left are similar. Stable cardiac silhouette. Electronically Signed   By: Lowella Grip III M.D.   On: 12/08/2019 11:01   DG Chest 2 View  Result Date: 12/02/2019 CLINICAL DATA:  Weight gain, shortness of breath EXAM: CHEST - 2 VIEW COMPARISON:  None. FINDINGS: The heart size and mediastinal contours are within normal limits. Small to moderate left pleural effusion. The visualized skeletal structures are unremarkable. IMPRESSION: Small to moderate left pleural effusion. Electronically Signed   By: Eddie Candle M.D.   On: 12/02/2019 12:56   CT CHEST WO CONTRAST  Result Date: 12/14/2019 CLINICAL DATA:  Shortness of breath. EXAM: CT CHEST WITHOUT CONTRAST TECHNIQUE: Multidetector CT imaging of the chest was performed following the standard protocol without IV contrast. COMPARISON:  December 08, 2019 FINDINGS:  Cardiovascular: There is moderate severity calcification of the aortic arch. Normal heart size. No pericardial effusion. Mediastinum/Nodes: No enlarged mediastinal or axillary lymph nodes. Thyroid gland, trachea, and esophagus demonstrate no significant findings. Lungs/Pleura: A 2 mm noncalcified lung nodule is seen within the right apex. Stable moderate severity areas of atelectasis and/or infiltrate are seen within the bilateral lung bases. Small bilateral pleural effusions are noted. These are mildly decreased in size when compared to the prior exam. No pneumothorax is identified. Upper Abdomen: No acute abnormality. Musculoskeletal: Moderate to marked severity multilevel degenerative changes seen throughout the thoracic spine. A chronic compression deformity of the T9 vertebral body is again noted. IMPRESSION: 1. Stable moderate severity bibasilar atelectasis and/or infiltrate. 2. Small bilateral pleural effusions, mildly decreased in size when compared to the prior exam. 3. 2 mm noncalcified lung nodule within the right apex. Correlation with 12 month follow-up chest CT is recommended to determine stability. 4. 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 5. Aortic atherosclerosis. Aortic Atherosclerosis (ICD10-I70.0). Electronically Signed   By: Virgina Norfolk M.D.   On: 12/14/2019 15:09   CT Chest Wo  Contrast  Result Date: 12/08/2019 CLINICAL DATA:  Follow-up abnormal chest x-ray EXAM: CT CHEST WITHOUT CONTRAST TECHNIQUE: Multidetector CT imaging of the chest was performed following the standard protocol without IV contrast. COMPARISON:  Chest x-ray from earlier in the same day. FINDINGS: Cardiovascular: Somewhat limited due to lack of IV contrast. Aortic calcifications are noted without aneurysmal dilatation. Heart is at the upper limits of normal in size. Coronary calcifications are seen. Mediastinum/Nodes: Thoracic inlet is within normal limits. No hilar or mediastinal adenopathy is  noted. Noted the esophagus as visualized is within normal limits. Lungs/Pleura: Bilateral pleural effusions are noted left slightly greater than right with some associated lower lobe atelectatic changes identified again left greater than right. Upper Abdomen: Visualized upper abdomen shows no acute abnormality. Musculoskeletal: Degenerative changes of the thoracic spine are noted. Chronic compression deformity at T9. IMPRESSION: Bilateral pleural effusions left greater than right with associated atelectatic changes. T9 compression deformity which appears chronic with increased sclerosis. No other focal abnormality is noted. Aortic Atherosclerosis (ICD10-I70.0). Electronically Signed   By: Inez Catalina M.D.   On: 12/08/2019 13:10   NM Pulmonary Perfusion  Result Date: 12/02/2019 CLINICAL DATA:  Bilateral leg edema EXAM: NUCLEAR MEDICINE PERFUSION LUNG SCAN TECHNIQUE: Perfusion images were obtained in multiple projections after intravenous injection of radiopharmaceutical. Ventilation scans intentionally deferred if perfusion scan and chest x-ray adequate for interpretation during COVID 19 epidemic. RADIOPHARMACEUTICALS:  4.05 mCi Tc-9m MAA IV COMPARISON:  Same day chest radiograph FINDINGS: Radiographically matched, nonsegmental defect of the left lung base, in keeping with pleural effusion seen on comparison chest radiograph. No evidence of segmental, wedge-shaped, or other suspicious perfusion defects. IMPRESSION: Radiographically matched, nonsegmental defect of the left lung base, in keeping with pleural effusion seen on comparison chest radiograph. Low probability examination for pulmonary embolism by modified perfusion only PIOPED criteria. Electronically Signed   By: Eddie Candle M.D.   On: 12/02/2019 12:55   US Venous Img Lower Bilateral (DVT)  Result Date: 12/02/2019 CLINICAL DATA:  Bilateral lower extremity edema EXAM: BILATERAL LOWER EXTREMITY VENOUS DOPPLER ULTRASOUND TECHNIQUE: Gray-scale  sonography with graded compression, as well as color Doppler and duplex ultrasound were performed to evaluate the lower extremity deep venous systems from the level of the common femoral vein and including the common femoral, femoral, profunda femoral, popliteal and calf veins including the posterior tibial, peroneal and gastrocnemius veins when visible. The superficial great saphenous vein was also interrogated. Spectral Doppler was utilized to evaluate flow at rest and with distal augmentation maneuvers in the common femoral, femoral and popliteal veins. COMPARISON:  None. FINDINGS: RIGHT LOWER EXTREMITY Common Femoral Vein: No evidence of thrombus. Normal compressibility, respiratory phasicity and response to augmentation. Saphenofemoral Junction: No evidence of thrombus. Normal compressibility and flow on color Doppler imaging. Profunda Femoral Vein: No evidence of thrombus. Normal compressibility and flow on color Doppler imaging. Femoral Vein: No evidence of thrombus. Normal compressibility, respiratory phasicity and response to augmentation. Popliteal Vein: No evidence of thrombus. Normal compressibility, respiratory phasicity and response to augmentation. Calf Veins: No evidence of thrombus. Normal compressibility and flow on color Doppler imaging. Superficial Great Saphenous Vein: No evidence of thrombus. Normal compressibility. Venous Reflux:  None. Other Findings:  None. LEFT LOWER EXTREMITY Common Femoral Vein: No evidence of thrombus. Normal compressibility, respiratory phasicity and response to augmentation. Saphenofemoral Junction: No evidence of thrombus. Normal compressibility and flow on color Doppler imaging. Profunda Femoral Vein: No evidence of thrombus. Normal compressibility and flow on color Doppler imaging. Femoral Vein:  No evidence of thrombus. Normal compressibility, respiratory phasicity and response to augmentation. Popliteal Vein: No evidence of thrombus. Normal compressibility,  respiratory phasicity and response to augmentation. Calf Veins: No evidence of thrombus. Normal compressibility and flow on color Doppler imaging. Superficial Great Saphenous Vein: No evidence of thrombus. Normal compressibility. Venous Reflux:  None. Other Findings:  None. IMPRESSION: No evidence of deep venous thrombosis in either lower extremity. Electronically Signed   By: Davina Poke D.O.   On: 12/02/2019 13:00   DG Chest Portable 1 View  Result Date: 12/22/2019 CLINICAL DATA:  Short of breath, dark stools, anemia EXAM: PORTABLE CHEST 1 VIEW COMPARISON:  12/11/2019 FINDINGS: Single frontal view of the chest demonstrates a stable cardiac silhouette. There is minimal residual left basilar consolidation and effusion, with marked improvement since prior study. Right chest is clear. No pneumothorax. Cardiac silhouette is unremarkable. IMPRESSION: 1. Minimal residual left basilar consolidation and effusion, with marked improvement since prior exam. Electronically Signed   By: Randa Ngo M.D.   On: 12/22/2019 17:36   DG Chest Port 1 View  Result Date: 12/11/2019 CLINICAL DATA:  Pleural effusion. EXAM: PORTABLE CHEST 1 VIEW COMPARISON:  CT 12/08/2019.  Chest x-ray 12/08/2019. FINDINGS: Mediastinum and hilar structures are normal. Cardiomegaly. Diffuse bilateral pulmonary infiltrates/edema noted on today's exam. Persistent bilateral pleural effusions. Findings most suggest CHF. Bilateral pneumonia cannot be excluded. No pneumothorax. IMPRESSION: Cardiomegaly. Diffuse bilateral pulmonary infiltrates/edema noted on today's exam. Persistent bilateral pleural effusions. Findings most consistent with CHF. Electronically Signed   By: Marcello Moores  Register   On: 12/11/2019 04:52   ECHOCARDIOGRAM COMPLETE  Result Date: 12/09/2019    ECHOCARDIOGRAM REPORT   Patient Name:   Tammie [name] Date of Exam: 12/08/2019 Medical Rec #:  [id number]             Height:       60.0 in Accession #:    [id number]             Weight:       221.0 lb Date of Birth:  05/15/1941             BSA:          1.948 m Patient Age:    79 years              BP:           131/72 mmHg Patient Gender: F                     HR:           58 bpm. Exam Location:  [facility] Procedure: 2D Echo, Cardiac Doppler and Color Doppler Indications:     Acute Respiratory Insufficiency 518.82 / R06.89  History:         Patient has no prior history of Echocardiogram examinations.                  Risk Factors:Hypertension and Diabetes.  Sonographer:     Alyse Low Roar Referring Phys:  [id number] Flora Lipps Diagnosing Phys: Nelva Bush MD IMPRESSIONS  1. Left ventricular ejection fraction, by estimation, is 60 to 65%. The left ventricle has normal function. The left ventricle has no regional wall motion abnormalities. There is mild left ventricular hypertrophy. Left ventricular diastolic parameters are consistent with Grade II diastolic dysfunction (pseudonormalization). Elevated left atrial pressure.  2. Pulmonary artery pressure is mildly to moderately elevated (RVSP 35 mmHg plus central venous pressure). Right ventricular systolic function is  normal. The right ventricular size is mildly enlarged. Mildly increased right ventricular wall thickness.  3. The mitral valve is degenerative. Trivial mitral valve regurgitation. No evidence of mitral stenosis.  4. Tricuspid valve regurgitation is mild to moderate.  5. The aortic valve was not well visualized. Aortic valve regurgitation is not visualized. No aortic stenosis is present. FINDINGS  Left Ventricle: Left ventricular ejection fraction, by estimation, is 60 to 65%. The left ventricle has normal function. The left ventricle has no regional wall motion abnormalities. The left ventricular internal cavity size was normal in size. There is  mild left ventricular hypertrophy. Left ventricular diastolic parameters are consistent with Grade II diastolic dysfunction (pseudonormalization). Elevated left atrial pressure. Right  Ventricle: Pulmonary artery pressure is mildly to moderately elevated (RVSP 35 mmHg plus central venous pressure). The right ventricular size is mildly enlarged. Mildly increased right ventricular wall thickness. Right ventricular systolic function  is normal. Left Atrium: Left atrial size was normal in size. Right Atrium: Right atrial size was normal in size. Pericardium: The pericardium was not well visualized. Mitral Valve: The mitral valve is degenerative in appearance. Mild mitral annular calcification. Trivial mitral valve regurgitation. No evidence of mitral valve stenosis. Tricuspid Valve: The tricuspid valve is not well visualized. Tricuspid valve regurgitation is mild to moderate. Aortic Valve: The aortic valve was not well visualized. Aortic valve regurgitation is not visualized. No aortic stenosis is present. Aortic valve mean gradient measures 5.0 mmHg. Aortic valve peak gradient measures 9.6 mmHg. Aortic valve area, by VTI measures 1.99 cm. Pulmonic Valve: The pulmonic valve was not well visualized. Pulmonic valve regurgitation is not visualized. No evidence of pulmonic stenosis. Aorta: The aortic root is normal in size and structure. Pulmonary Artery: The pulmonary artery is not well seen. Venous: The inferior vena cava was not well visualized. IAS/Shunts: The interatrial septum was not well visualized. Additional Comments: There is pleural effusion in the left lateral region.  LEFT VENTRICLE PLAX 2D LVIDd:         4.53 cm  Diastology LVIDs:         3.17 cm  LV e' lateral:   8.92 cm/s LV PW:         1.11 cm  LV E/e' lateral: 11.4 LV IVS:        1.16 cm  LV e' medial:    5.98 cm/s LVOT diam:     1.80 cm  LV E/e' medial:  17.1 LV SV:         67 LV SV Index:   34 LVOT Area:     2.54 cm  RIGHT VENTRICLE RV Mid diam:    4.30 cm RV S prime:     13.90 cm/s TAPSE (M-mode): 2.9 cm LEFT ATRIUM             Index       RIGHT ATRIUM           Index LA diam:        4.00 cm 2.05 cm/m  RA Area:     16.70 cm LA  Vol (A2C):   69.3 ml 35.58 ml/m RA Volume:   44.50 ml  22.85 ml/m LA Vol (A4C):   51.2 ml 26.29 ml/m LA Biplane Vol: 60.2 ml 30.91 ml/m  AORTIC VALVE                    PULMONIC VALVE AV Area (Vmax):    2.15 cm     PV Vmax:  1.02 m/s AV Area (Vmean):   1.89 cm     PV Peak grad:  4.2 mmHg AV Area (VTI):     1.99 cm AV Vmax:           155.00 cm/s AV Vmean:          102.000 cm/s AV VTI:            0.338 m AV Peak Grad:      9.6 mmHg AV Mean Grad:      5.0 mmHg LVOT Vmax:         131.00 cm/s LVOT Vmean:        75.900 cm/s LVOT VTI:          0.264 m LVOT/AV VTI ratio: 0.78  AORTA Ao Root diam: 2.70 cm MITRAL VALVE                TRICUSPID VALVE MV Area (PHT): 3.89 cm     TR Peak grad:   34.8 mmHg MV Decel Time: 195 msec     TR Vmax:        295.00 cm/s MV E velocity: 102.00 cm/s MV A velocity: 86.10 cm/s   SHUNTS MV E/A ratio:  1.18         Systemic VTI:  0.26 m MV A Prime:    10.9 cm/s    Systemic Diam: 1.80 cm Nelva Bush MD Electronically signed by Nelva Bush MD Signature Date/Time: 12/09/2019/10:09:34 AM    Final      Subjective: Patient was feeling better when seen today.  She wants to go home.  Denies any dizziness, chest pain or shortness of breath.  Discharge Exam: Vitals:   12/25/19 1330 12/25/19 1400  BP: (!) 127/44 (!) 129/48  Pulse: 78 79  Resp: (!) 21 18  Temp:    SpO2: 100% 99%   Vitals:   12/25/19 1230 12/25/19 1300 12/25/19 1330 12/25/19 1400  BP: (!) 137/53 (!) 128/44 (!) 127/44 (!) 129/48  Pulse: 81 74 78 79  Resp: 19 17 (!) 21 18  Temp:      TempSrc:      SpO2: 100% 100% 100% 99%  Weight:      Height:        General: Pt is alert, awake, not in acute distress Cardiovascular: RRR, S1/S2 +, no rubs, no gallops Respiratory: CTA bilaterally, no wheezing, no rhonchi Abdominal: Soft, NT, ND, bowel sounds + Extremities: no edema, no cyanosis   The results of significant diagnostics from this hospitalization (including imaging, microbiology, ancillary and  laboratory) are listed below for reference.    Microbiology: Recent Results (from the past 240 hour(s))  SARS Coronavirus 2 by RT PCR (hospital order, performed in [facility] hospital lab) Nasopharyngeal Nasopharyngeal Swab     Status: None   Collection Time: 12/22/19  5:19 PM   Specimen: Nasopharyngeal Swab  Result Value Ref Range Status   SARS Coronavirus 2 NEGATIVE NEGATIVE Final    Comment: (NOTE) SARS-CoV-2 target nucleic acids are NOT DETECTED.  The SARS-CoV-2 RNA is generally detectable in upper and lower respiratory specimens during the acute phase of infection. The lowest concentration of SARS-CoV-2 viral copies this assay can detect is 250 copies / mL. A negative result does not preclude SARS-CoV-2 infection and should not be used as the sole basis for treatment or other patient management decisions.  A negative result may occur with improper specimen collection / handling, submission of specimen other than nasopharyngeal swab, presence of viral mutation(s)  within the areas targeted by this assay, and inadequate number of viral copies (<250 copies / mL). A negative result must be combined with clinical observations, patient history, and epidemiological information.  Fact Sheet for Patients:   StrictlyIdeas.no  Fact Sheet for Healthcare Providers: BankingDealers.co.za  This test is not yet approved or  cleared by the [place] FDA and has been authorized for detection and/or diagnosis of SARS-CoV-2 by FDA under an Emergency Use Authorization (EUA).  This EUA will remain in effect (meaning this test can be used) for the duration of the COVID-19 declaration under Section 564(b)(1) of the Act, 21 U.S.C. section 360bbb-3(b)(1), unless the authorization is terminated or revoked sooner.  Performed at [facility], [address] [place], [place] [address]      Labs: BNP (last 3 results) Recent Labs     12/01/19 1253 12/08/19 1036  BNP 139.2* 157.2*   Basic Metabolic Panel: Recent Labs  Lab 12/22/19 1719 12/23/19 0425 12/24/19 0436 12/25/19 0343  NA 133* 136 138 137  K 4.5 4.2 3.9 4.3  CL 91* 93* 99 101  CO2 30 29 31 29   GLUCOSE 155* 174* 110* 206*  BUN 109* 100* 85* 54*  CREATININE 1.82* 1.70* 2.42* 1.74*  CALCIUM 9.2 9.1 8.5* 8.7*   Liver Function Tests: No results for input(s): AST, ALT, ALKPHOS, BILITOT, PROT, ALBUMIN in the last 168 hours. No results for input(s): LIPASE, AMYLASE in the last 168 hours. No results for input(s): AMMONIA in the last 168 hours. CBC: Recent Labs  Lab 12/22/19 1719 12/22/19 2210 12/23/19 0954 12/23/19 1605 12/23/19 2200 12/24/19 0436 12/25/19 0343  WBC 15.8*  --   --   --   --  10.2 10.9*  NEUTROABS 11.4*  --   --   --   --   --   --   HGB 9.2*   < > 8.4* 8.0* 6.9* 8.6* 8.7*  HCT 29.3*   < > 27.0* 26.1* 22.2* 27.7* 28.8*  MCV 82.5  --   --   --   --  85.0 85.7  PLT 452*  --   --   --   --  400 421*   < > = values in this interval not displayed.   Cardiac Enzymes: No results for input(s): CKTOTAL, CKMB, CKMBINDEX, TROPONINI in the last 168 hours. BNP: Invalid input(s): POCBNP CBG: Recent Labs  Lab 12/24/19 1137 12/24/19 1554 12/25/19 0732 12/25/19 1122 12/25/19 1535  GLUCAP 195* 264* 175* 196* 131*   D-Dimer No results for input(s): DDIMER in the last 72 hours. Hgb A1c Recent Labs    12/22/19 1719  HGBA1C 7.1*   Lipid Profile No results for input(s): CHOL, HDL, LDLCALC, TRIG, CHOLHDL, LDLDIRECT in the last 72 hours. Thyroid function studies No results for input(s): TSH, T4TOTAL, T3FREE, THYROIDAB in the last 72 hours.  Invalid input(s): FREET3 Anemia work up No results for input(s): VITAMINB12, FOLATE, FERRITIN, TIBC, IRON, RETICCTPCT in the last 72 hours. Urinalysis No results found for: COLORURINE, APPEARANCEUR, [place], [place], [place], [place], [place], [place], PROTEINUR, UROBILINOGEN, NITRITE,  LEUKOCYTESUR Sepsis Labs Invalid input(s): PROCALCITONIN,  WBC,  LACTICIDVEN Microbiology Recent Results (from the past 240 hour(s))  SARS Coronavirus 2 by RT PCR (hospital order, performed in [facility] hospital lab) Nasopharyngeal Nasopharyngeal Swab     Status: None   Collection Time: 12/22/19  5:19 PM   Specimen: Nasopharyngeal Swab  Result Value Ref Range Status   SARS Coronavirus 2 NEGATIVE NEGATIVE Final  Comment: (NOTE) SARS-CoV-2 target nucleic acids are NOT DETECTED.  The SARS-CoV-2 RNA is generally detectable in upper and lower respiratory specimens during the acute phase of infection. The lowest concentration of SARS-CoV-2 viral copies this assay can detect is 250 copies / mL. A negative result does not preclude SARS-CoV-2 infection and should not be used as the sole basis for treatment or other patient management decisions.  A negative result may occur with improper specimen collection / handling, submission of specimen other than nasopharyngeal swab, presence of viral mutation(s) within the areas targeted by this assay, and inadequate number of viral copies (<250 copies / mL). A negative result must be combined with clinical observations, patient history, and epidemiological information.  Fact Sheet for Patients:   StrictlyIdeas.no  Fact Sheet for Healthcare Providers: BankingDealers.co.za  This test is not yet approved or  cleared by the [place] FDA and has been authorized for detection and/or diagnosis of SARS-CoV-2 by FDA under an Emergency Use Authorization (EUA).  This EUA will remain in effect (meaning this test can be used) for the duration of the COVID-19 declaration under Section 564(b)(1) of the Act, 21 U.S.C. section 360bbb-3(b)(1), unless the authorization is terminated or revoked sooner.  Performed at [facility], [address] [place], [place] [address]     Time coordinating  discharge: Over 30 minutes  SIGNED:  Lorella Nimrod, MD  [facility] 12/25/2019, 3:37 PM  If 7PM-7AM, please contact night-coverage [email or web address]  This record has been created using Systems analyst. Errors have been sought and corrected,but may not always be located. Such creation errors do not reflect on the standard of care.

## 2019-12-25 NOTE — Progress Notes (Signed)
Patient left facility at this time.

## 2019-12-25 NOTE — TOC Progression Note (Addendum)
Transition of Care [facility]) - Progression Note    Patient Details  Name: Tammie [name] MRN: [id number] Date of Birth: 07/29/1940  Transition of Care [facility]) CM/SW [place], [place] Phone Number: [phone] 12/25/2019, 9:33 AM  Clinical Narrative:     CSW spoke with Melissa at [facility] [phone], the patient is active with a RN and PT, and will need resumption orders for continued care.  CSW sent Attending a message with an update on home health needs. . Expected Discharge Plan: [place] Barriers to Discharge: Continued Medical Work up  Expected Discharge Plan and Services Expected Discharge Plan: [place] In-house Referral: Clinical Social Work   Post Acute Care Choice: [place] arrangements for the past 2 months: Single Family Home                                       Social Determinants of Health (SDOH) Interventions    Readmission Risk Interventions No flowsheet data found.

## 2019-12-25 NOTE — TOC Transition Note (Signed)
Transition of Care [facility]) - CM/SW Discharge Note   Patient Details  Name: Tammie [name] MRN: [id number] Date of Birth: [birth year]  [facility]) CM/SW Contact:  Ova Freshwater Phone Number: [phone] 12/25/2019, 4:54 PM   Clinical Narrative:     CSW spoke with patient's son Leroy Sea, he stated they will pick up the patient in about 40 minutes. Brad requested for his mother to have something to eat before leaving the hospital. [place] contacted Melissa with [place] and advice on patient disposition.  No DME recommended. Consult complete    Barriers to Discharge: Continued Medical Work up   Patient Goals and CMS Choice Patient states their goals for this hospitalization and ongoing recovery are:: I am here for somehting else now. Working me up for GI bleed CMS Medicare.gov Compare Post Acute Care list provided to:: Patient Choice offered to / list presented to : Patient  Discharge Placement                       Discharge Plan and Services In-house Referral: Clinical Social Work   Post Acute Care Choice: Home Health                               Social Determinants of Health (SDOH) Interventions     Readmission Risk Interventions No flowsheet data found.

## 2019-12-25 NOTE — Progress Notes (Signed)
IVs removed, discharge instructions given to patient.  Awaiting for patient's ride to come and pick her up.

## 2020-01-09 ENCOUNTER — Other Ambulatory Visit: Payer: Self-pay

## 2020-01-09 ENCOUNTER — Ambulatory Visit: Payer: Medicare HMO | Admitting: Family

## 2020-01-09 ENCOUNTER — Encounter: Payer: Self-pay | Admitting: Family

## 2020-01-09 VITALS — BP 130/70 | HR 96 | Ht 64.0 in | Wt 189.6 lb

## 2020-01-09 DIAGNOSIS — E875 Hyperkalemia: Secondary | ICD-10-CM

## 2020-01-09 DIAGNOSIS — I10 Essential (primary) hypertension: Secondary | ICD-10-CM | POA: Diagnosis not present

## 2020-01-09 DIAGNOSIS — I5032 Chronic diastolic (congestive) heart failure: Secondary | ICD-10-CM

## 2020-01-09 DIAGNOSIS — I493 Ventricular premature depolarization: Secondary | ICD-10-CM | POA: Diagnosis not present

## 2020-01-09 DIAGNOSIS — N1832 Chronic kidney disease, stage 3b: Secondary | ICD-10-CM | POA: Diagnosis not present

## 2020-01-09 MED ORDER — TORSEMIDE 20 MG PO TABS: ORAL_TABLET | ORAL | 2 refills | Status: AC

## 2020-01-09 MED ORDER — METOPROLOL SUCCINATE ER 25 MG PO TB24
12.5000 mg | ORAL_TABLET | Freq: Every day | ORAL | 2 refills | Status: DC
Start: 2020-01-09 — End: 2020-02-20

## 2020-01-09 NOTE — Progress Notes (Signed)
Office Visit    Patient Name: Tammie [name] Date of Encounter: 01/09/2020  Primary Care Provider:  Donnamarie Rossetti, PA-C Primary Cardiologist:  Nelva Bush, MD Electrophysiologist:  None   Chief Complaint    Tammie [name] is a 79 y.o. female with a hx of diabetes mellitus, HTN, HLD, obesity, HFpEF, GI bleed presents today for hospital follow-up  Past Medical History    Past Medical History:  Diagnosis Date  . Cancer ([place])    skin  . CHF (congestive heart failure) ([place])   . Diabetes mellitus without complication ([place])   . Dysrhythmia   . Hypertension    Past Surgical History:  Procedure Laterality Date  . ABDOMINAL HYSTERECTOMY    . BREAST BIOPSY Left 2013   CORE W/CLIP - NEG  . ESOPHAGOGASTRODUODENOSCOPY (EGD) WITH PROPOFOL N/A 12/23/2019   Procedure: ESOPHAGOGASTRODUODENOSCOPY (EGD) WITH PROPOFOL;  Surgeon: Jonathon Bellows, MD;  Location: [facility] ENDOSCOPY;  Service: Gastroenterology;  Laterality: N/A;    Allergies  Allergies  Allergen Reactions  . Ampicillin Hives  . Contrast Media [Iodinated Diagnostic Agents] Hives  . Penicillin G Hives  . Sulfamethoxazole-Trimethoprim Hives  . Tetracyclines & Related Hives  . Verapamil Hives  . Amlodipine Rash   History of Present Illness    Tammie [name] is a 79 y.o. female with a hx of diabetes mellitus, HTN, HLD, obesity, HFpEF, GI bleed last seen while hospitalized.  She is admitted to [facility] 12/08/2019 after presenting with acute on chronic HFpEF and new bilateral pleural effusions, pulmonary edema, generalized anasarca.  She initially required BiPAP and Levophed.  Her echo 12/08/19 showed normal LVEF, LVH, trivial MR, mild to moderately elevated PASP, grade 2 diastolic dysfunction.  She was treated with IV Lasix and transitioned to p.o. torsemide and spironolactone at discharge.  Her Coreg, hydrochlorothiazide, lisinopril were discontinued at discharge due to hypotension.  She was recommended for  outpatient Myoview to be discussed at follow-up in clinic due to demand ischemia.  She was discharged 12/16/2019 with weight 200lbs.  Subsequently readmitted 12/22/2019 with GI bleed.  EGD was positive for nonbleeding gastric ulcer.  She was recommended for PPI for 2 weeks and outpatient follow-up with gastroenterology.  She required 1 unit of PRBC.  She was discharged 12/25/2019 with weight 200lbs.  When seen by PCP 7/60/21 she was noted to be holding torsemide, spironolactone, lisinopril.  She was recommended to hold Metformin due to poor kidney function.  Labs via care everywhere 12/29/2019  Hemoglobin 9.7, magnesium 1.7, potassium 5.4, creatinine 1.2, GFR 43, AST 9, ALT 10  Staying with her son and daughter in law. Her daughter in law is a Marine scientist and has been helping her with follow a low sodium and low carbohydrate diet. She is planning to go to her own home this weekend.   She has been taking Torsemide 20mg  daily in the morning. Her Spironolactone, Coreg, Lisinopril have previously been stopped for a combination of hypotension and hyperkalemia. She has resumed her Metformin at the direction of her PCP.   She is down an additional 11 lbs from discharge. Monitoring at home carefully with stable weights. Her Bp at home is 110s-120s/70s. HR routinely 90s-109 bpm.   Reports no shortness of breath at rest. Endorses mild dyspnea on exertion. Reports no chest pain, pressure, or tightness. No edema, orthopnea, PND. Reports no palpitations.   EKGs/Labs/Other Studies Reviewed:   The following studies were reviewed today:   EKG:  EKG is ordered today.  The ekg ordered  today demonstrates SR 96 bpm with frequent PVCs.  Recent Labs: 12/08/2019: ALT 13; B Natriuretic Peptide 395.6 12/16/2019: Magnesium 1.6 12/25/2019: BUN 54; Creatinine, Ser 1.74; Hemoglobin 8.7; Platelets 421; Potassium 4.3; Sodium 137  Recent Lipid Panel No results found for: CHOL, TRIG, HDL, CHOLHDL, VLDL, LDLCALC, LDLDIRECT  Home  Medications   Current Meds  Medication Sig  . ferrous sulfate 325 (65 FE) MG EC tablet Take 1 tablet (325 mg total) by mouth 2 (two) times daily.  [name] glimepiride (AMARYL) 2 MG tablet Take 2 mg by mouth daily with breakfast.   . insulin detemir (LEVEMIR) 100 UNIT/ML injection Inject 0.1 mLs (10 Units total) into the skin 2 (two) times daily.  [name] levothyroxine (SYNTHROID) 75 MCG tablet Take 75 mcg by mouth daily.  [name] lovastatin (MEVACOR) 20 MG tablet Take 20 mg by mouth at bedtime.  [name] MAGNESIUM OXIDE PO Take by mouth.  . metFORMIN (GLUCOPHAGE) 1000 MG tablet Take 1,000 mg by mouth 2 (two) times daily.  . pantoprazole (PROTONIX) 20 MG tablet Take by mouth.  . torsemide (DEMADEX) 20 MG tablet Take 20mg  (one tablet) daily. You may take an extra tablet in the afternoon as needed for weight gain of 3 pounds overnight or 5 pounds in one week.  . [DISCONTINUED] torsemide (DEMADEX) 20 MG tablet Take 2 tablets (40 mg total) by mouth 2 (two) times daily.      Review of Systems     Review of Systems  Constitutional: Negative for chills, fever and malaise/fatigue.  Cardiovascular: Positive for dyspnea on exertion. Negative for chest pain, irregular heartbeat, leg swelling, near-syncope, orthopnea, palpitations and syncope.  Respiratory: Negative for cough, shortness of breath and wheezing.   Gastrointestinal: Negative for melena, nausea and vomiting.  Genitourinary: Negative for hematuria.  Neurological: Negative for dizziness, light-headedness and weakness.   All other systems reviewed and are otherwise negative except as noted above.  Physical Exam    VS:  BP (!) 130/70 (BP Location: Left Arm, Patient Position: Sitting, Cuff Size: Normal)   Pulse 96   Ht 5\' 4"  (1.626 m)   Wt 189 lb 9.6 oz (86 kg)   SpO2 96%   BMI 32.54 kg/m  , BMI Body mass index is 32.54 kg/m. GEN: Well nourished, well developed, in no acute distress. HEENT: normal. Neck: Supple, no JVD, carotid bruits, or  masses. Cardiac: RRR, no murmurs, rubs, or gallops. No clubbing, cyanosis, edema.  Radials/DP/PT 2+ and equal bilaterally.  Respiratory:  Respirations regular and unlabored, clear to auscultation bilaterally. GI: Soft, nontender, nondistended, BS + x 4. MS: No deformity or atrophy. Skin: Warm and dry, no rash. Neuro:  Strength and sensation are intact. Psych: Normal affect.  Assessment & Plan    1. HFpEF - Euvolemic and well compensated on exam. Echo 12/08/19 EF 60-65%, grade II diastolic dysfunction.  Continue Torsemide 20mg  daily. No spironolactone as she is euvolemic and has had hyperkalemia. Low sodium diet encouraged. Recommend <2L fluid per day.   2. Hyperkalemia - Following closely with her primary care provider. Had labs done yesterday with PCP for monitoring. Encouraged to reduce her intake of salt substitute as it is likely contributory to hyperkalemia. Her Spironolactone has been discontinued and would not recommend resuming due to hyperkalemia.  3. CKD III - labs 12/29/19 creatinine 1.2, GFR 43, K 5.4. Repeat labs done yesterday but not yet available for review. Continue careful titration of diuretics and antihypertensives.   4. GI bleed - Recent GI bleed admission requiring 1u  PRBC. No recurrent bleeding. CBC 12/29/19 with Hb 9.7.   5. PVC - Noted on EKG today. Her Coreg has been discontinued due to hypotension. Due to HR routinely 90s-109 bpm at home and frequent PVC on EKG today, start Metoprolol Succinate 12.5mg  daily.   Disposition: Follow up in 6 week(s) with Dr. Saunders Revel or APP  Loel Dubonnet, NP 01/09/2020, 6:44 PM

## 2020-01-09 NOTE — Patient Instructions (Addendum)
Medication Instructions:  Your physician has recommended you make the following change in your medication:   CONTINUE Torsemide 20mg  daily  You may take an extra tablet in the afternoon as needed for weight gain of 3 pounds overnight or 5 pounds in one week.  START Metoprolol Succinate 12.5mg  (half tablet) daily  *If you need a refill on your cardiac medications before your next appointment, please call your pharmacy*  Lab Work: No lab work today.  Testing/Procedures: Your EKG today shows normal sinus rhythm with occasional PVC (premature ventricular contraction). These are early beats in the bottom chamber of your heart.   Follow-Up: At [facility], you and your health needs are our priority.  As part of our continuing mission to provide you with exceptional heart care, we have created designated Provider Care Teams.  These Care Teams include your primary Cardiologist (physician) and Advanced Practice Providers (APPs -  Physician Assistants and Nurse Practitioners) who all work together to provide you with the care you need, when you need it.  We recommend signing up for the patient portal called "MyChart".  Sign up information is provided on this After Visit Summary.  MyChart is used to connect with patients for Virtual Visits (Telemedicine).  Patients are able to view lab/test results, encounter notes, upcoming appointments, etc.  Non-urgent messages can be sent to your provider as well.   To learn more about what you can do with MyChart, go to [email or web address].    Your next appointment:   6 week(s)  The format for your next appointment:   In Person  Provider:   You may see Nelva Bush, MD or one of the following Advanced Practice Providers on your designated Care Team:    Murray Hodgkins, NP  Christell Faith, PA-C  Marrianne Mood, PA-C  Laurann [place], NP  Other Instructions  Continue low salt, heart healthy diet.   If you gain 3 pounds overnight or 5  pounds in one week, take an extra Torsemide.    Premature Ventricular Contraction  A premature ventricular contraction (PVC) is a common kind of irregular heartbeat (arrhythmia). These contractions are extra heartbeats that start in the ventricles of the heart and occur too early in the normal sequence. During the PVC, the heart's normal electrical pathway is not used, so the beat is shorter and less effective. In most cases, these contractions come and go and do not require treatment. What are the causes? Common causes of the condition include:  Smoking.  Drinking alcohol.  Certain medicines.  Some illegal drugs.  Stress.  Caffeine. Certain medical conditions can also cause PVCs:  Heart failure.  Heart attack, or coronary artery disease.  Heart valve problems.  Changes in minerals in the blood (electrolytes).  Low blood oxygen levels or high carbon dioxide levels. In many cases, the cause of this condition is not known. What are the signs or symptoms? The main symptom of this condition is fast or skipped heartbeats (palpitations). Other symptoms include:  Chest pain.  Shortness of breath.  Feeling tired.  Dizziness.  Difficulty exercising. In some cases, there are no symptoms. How is this diagnosed? This condition may be diagnosed based on:  Your medical history.  A physical exam. During the exam, the health care provider will check for irregular heartbeats.  Tests, such as: ? An ECG (electrocardiogram) to monitor the electrical activity of your heart. ? An ambulatory cardiac monitor. This device records your heartbeats for 24 hours or more. ? Stress tests to see  how exercise affects your heart rhythm and blood supply. ? An echocardiogram. This test uses sound waves (ultrasound) to produce an image of your heart. ? An electrophysiology study (EPS). This test checks for electrical problems in your heart. How is this treated? Treatment for this condition  depends on any underlying conditions, the type of PVCs that you are having, and how much the symptoms are interfering with your daily life. Possible treatments include:  Avoiding things that cause premature contractions (triggers). These include caffeine and alcohol.  Taking medicines if symptoms are severe or if the extra heartbeats are frequent.  Getting treatment for underlying conditions that cause PVCs.  Having an implantable cardioverter defibrillator (ICD), if you are at risk for a serious arrhythmia. The ICD is a small device that is inserted into your chest to monitor your heartbeat. When it senses an irregular heartbeat, it sends a shock to bring the heartbeat back to normal.  Having a procedure to destroy the portion of the heart tissue that sends out abnormal signals (catheter ablation). In some cases, no treatment is required. Follow these instructions at home: Lifestyle  Do not use any products that contain nicotine or tobacco, such as cigarettes, e-cigarettes, and chewing tobacco. If you need help quitting, ask your health care provider.  Do not use illegal drugs.  Exercise regularly. Ask your health care provider what type of exercise is safe for you.  Try to get at least 7-9 hours of sleep each night, or as much as recommended by your health care provider.  Find healthy ways to manage stress. Avoid stressful situations when possible. Alcohol use  Do not drink alcohol if: ? Your health care provider tells you not to drink. ? You are pregnant, may be pregnant, or are planning to become pregnant. ? Alcohol triggers your episodes.  If you drink alcohol: ? Limit how much you use to:  0-1 drink a day for women.  0-2 drinks a day for men.  Be aware of how much alcohol is in your drink. In the [place] one drink equals one 12 oz bottle of beer (355 mL), one 5 oz glass of wine (148 mL), or one 1 oz glass of hard liquor (44 mL). General instructions  Take  over-the-counter and prescription medicines only as told by your health care provider.  If caffeine triggers episodes of PVC, do not eat, drink, or use anything with caffeine in it.  Keep all follow-up visits as told by your health care provider. This is important. Contact a health care provider if you:  Feel palpitations. Get help right away if you:  Have chest pain.  Have shortness of breath.  Have sweating for no reason.  Have nausea and vomiting.  Become light-headed or you faint. Summary  A premature ventricular contraction (PVC) is a common kind of irregular heartbeat (arrhythmia).  In most cases, these contractions come and go and do not require treatment.  You may need to wear an ambulatory cardiac monitor. This records your heartbeats for 24 hours or more.  Treatment depends on any underlying conditions, the type of PVCs that you are having, and how much the symptoms are interfering with your daily life. This information is not intended to replace advice given to you by your health care provider. Make sure you discuss any questions you have with your health care provider. Document Revised: 02/21/2018 Document Reviewed: 02/21/2018 Elsevier Patient Education  2020 [employer].

## 2020-01-12 ENCOUNTER — Telehealth: Payer: Self-pay | Admitting: Internal Medicine

## 2020-01-12 NOTE — Telephone Encounter (Signed)
Likely PCP best option since was prescribed in hospital.   I'm not sure what DME company was utilized.  Loel Dubonnet, NP

## 2020-01-12 NOTE — Telephone Encounter (Signed)
Notified patient's daughter to contact her PCP for further advice on discontinuing the oxygen. She was appreciative and verbalized understanding.

## 2020-01-12 NOTE — Telephone Encounter (Signed)
Patients daughter calling in after recent visit with [employer]. During visit home oxygen was discussed and when patient was told she did not need it, daughter called and was told an order is needed for the oxygen company to pick up tanks at home. The oxygen was "prescribed" while in the hospital and now patient is curious if our office can write an order for it to be d/c'ed and picked up  [place] is [place] (ph: [phone])  Please advise

## 2020-02-09 MED FILL — Sodium Chloride Flush IV Soln 0.9%: INTRAVENOUS | Qty: 3 | Status: AC

## 2020-02-09 MED FILL — Levothyroxine Sodium Tab 25 MCG: ORAL | Qty: 25 | Status: AC

## 2020-02-09 MED FILL — Levothyroxine Sodium Tab 50 MCG: ORAL | Qty: 50 | Status: AC

## 2020-02-09 MED FILL — Enoxaparin Sodium Inj 40 MG/0.4ML: SUBCUTANEOUS | Qty: 0.4 | Status: AC

## 2020-02-20 ENCOUNTER — Other Ambulatory Visit: Payer: Self-pay

## 2020-02-20 ENCOUNTER — Encounter: Payer: Self-pay | Admitting: Family

## 2020-02-20 ENCOUNTER — Ambulatory Visit: Payer: Medicare HMO | Admitting: Family

## 2020-02-20 VITALS — BP 148/70 | HR 82 | Ht 64.0 in | Wt 186.4 lb

## 2020-02-20 DIAGNOSIS — I5032 Chronic diastolic (congestive) heart failure: Secondary | ICD-10-CM

## 2020-02-20 DIAGNOSIS — I493 Ventricular premature depolarization: Secondary | ICD-10-CM

## 2020-02-20 DIAGNOSIS — I10 Essential (primary) hypertension: Secondary | ICD-10-CM | POA: Diagnosis not present

## 2020-02-20 DIAGNOSIS — E875 Hyperkalemia: Secondary | ICD-10-CM

## 2020-02-20 DIAGNOSIS — N1832 Chronic kidney disease, stage 3b: Secondary | ICD-10-CM

## 2020-02-20 MED ORDER — METOPROLOL SUCCINATE ER 25 MG PO TB24: 25.0000 mg | ORAL_TABLET | Freq: Every day | ORAL | 1 refills | Status: DC

## 2020-02-20 NOTE — Progress Notes (Signed)
Office Visit    Patient Name: Tammie [name] Date of Encounter: 02/20/2020  Primary Care Provider:  Donnamarie Rossetti, PA-C Primary Cardiologist:  Nelva Bush, MD Electrophysiologist:  None   Chief Complaint    Tammie [name] is a 79 y.o. female with a hx of diabetes mellitus, HTN, HLD, obesity, HFpEF, GI bleed presents today for follow-up after addition of metoprolol  Past Medical History    Past Medical History:  Diagnosis Date  . Cancer ([place])    skin  . CHF (congestive heart failure) ([place])   . Diabetes mellitus without complication ([place])   . Dysrhythmia   . Hypertension    Past Surgical History:  Procedure Laterality Date  . ABDOMINAL HYSTERECTOMY    . BREAST BIOPSY Left 2013   CORE W/CLIP - NEG  . ESOPHAGOGASTRODUODENOSCOPY (EGD) WITH PROPOFOL N/A 12/23/2019   Procedure: ESOPHAGOGASTRODUODENOSCOPY (EGD) WITH PROPOFOL;  Surgeon: Jonathon Bellows, MD;  Location: [facility] ENDOSCOPY;  Service: Gastroenterology;  Laterality: N/A;    Allergies  Allergies  Allergen Reactions  . Ampicillin Hives  . Contrast Media [Iodinated Diagnostic Agents] Hives  . Penicillin G Hives  . Sulfamethoxazole-Trimethoprim Hives  . Tetracyclines & Related Hives  . Verapamil Hives  . Amlodipine Rash   History of Present Illness    Tammie [name] is a 79 y.o. female with a hx of diabetes mellitus, HTN, HLD, obesity, HFpEF, GI bleed last seen 01/09/2020  She is admitted to [facility] 12/08/2019 after presenting with acute on chronic HFpEF and new bilateral pleural effusions, pulmonary edema, generalized anasarca.  She initially required BiPAP and Levophed.  Her echo 12/08/19 showed normal LVEF, LVH, trivial MR, mild to moderately elevated PASP, grade 2 diastolic dysfunction.  She was treated with IV Lasix and transitioned to p.o. torsemide and spironolactone at discharge.  Her Coreg, hydrochlorothiazide, lisinopril were discontinued at discharge due to hypotension.  She was  recommended for outpatient Myoview to be discussed at follow-up in clinic due to demand ischemia.  She was discharged 12/16/2019 with weight 200lbs.  Subsequently readmitted 12/22/2019 with GI bleed.  EGD was positive for nonbleeding gastric ulcer.  She was recommended for PPI for 2 weeks and outpatient follow-up with gastroenterology.  She required 1 unit of PRBC.  She was discharged 12/25/2019 with weight 200lbs.  When seen by PCP 7/60/21 she was noted to be holding torsemide, spironolactone, lisinopril, coreg.  She was recommended to hold Metformin due to poor kidney function.  Her Metformin and torsemide have been reinitiated since that time.  She was seen in clinic 01/09/2020 and started on Toprol 12.5 mg daily due to PVCs and tachycardia.  Labs via care everywhere  12/29/19: Hemoglobin 9.7, magnesium 1.7, potassium 5.4, creatinine 1.2, GFR 43, AST 9, ALT 10  01/09/20: Hemoglobin 10.3, K4.0, creatinine 1.4, GFR 36  Her weight is down 3 pounds from her clinic visit 6 weeks ago.  She reports over the last week her blood pressure has gone from being routinely on the 109N systolic to greater than 235.  Reports no increased stress.  She was previously on carvedilol but tells me she did not tolerate it well.  She was on lisinopril but this was discontinued due to kidney injury.  She is interested in better control of her blood pressure.  Reports no shortness of breath at rest. Endorses mild dyspnea on exertion. Reports no chest pain, pressure, or tightness. No edema, orthopnea, PND. Reports no palpitations.   EKGs/Labs/Other Studies Reviewed:   The following studies  were reviewed today:   EKG:  EKG is ordered today.  The ekg ordered today demonstrates SR 85 bpm with no acute ST/T wave changes.   Recent Labs: 12/08/2019: ALT 13; B Natriuretic Peptide 395.6 12/16/2019: Magnesium 1.6 12/25/2019: BUN 54; Creatinine, Ser 1.74; Hemoglobin 8.7; Platelets 421; Potassium 4.3; Sodium 137  Recent Lipid Panel No  results found for: CHOL, TRIG, HDL, CHOLHDL, VLDL, LDLCALC, LDLDIRECT  Home Medications   Current Meds  Medication Sig  . ferrous sulfate 325 (65 FE) MG EC tablet Take 1 tablet (325 mg total) by mouth 2 (two) times daily.  [name] glimepiride (AMARYL) 2 MG tablet Take 2 mg by mouth daily with breakfast.   . insulin detemir (LEVEMIR) 100 UNIT/ML injection Inject 0.1 mLs (10 Units total) into the skin 2 (two) times daily. (Patient taking differently: Inject 15 Units into the skin 2 (two) times daily. )  . levothyroxine (SYNTHROID) 75 MCG tablet Take 75 mcg by mouth daily.  [name] lovastatin (MEVACOR) 20 MG tablet Take 20 mg by mouth at bedtime.  [name] MAGNESIUM OXIDE PO Take by mouth.  . metFORMIN (GLUCOPHAGE) 1000 MG tablet Take 1,000 mg by mouth 2 (two) times daily.  . metoprolol succinate (TOPROL XL) 25 MG 24 hr tablet Take 1 tablet (25 mg total) by mouth daily.  . pantoprazole (PROTONIX) 40 MG tablet Take 40 mg by mouth daily.  [name] torsemide (DEMADEX) 20 MG tablet Take 20mg  (one tablet) daily. You may take an extra tablet in the afternoon as needed for weight gain of 3 pounds overnight or 5 pounds in one week.  . [DISCONTINUED] metoprolol succinate (TOPROL XL) 25 MG 24 hr tablet Take 0.5 tablets (12.5 mg total) by mouth daily.      Review of Systems   Review of Systems  Constitutional: Negative for chills, fever and malaise/fatigue.  Cardiovascular: Positive for dyspnea on exertion. Negative for chest pain, irregular heartbeat, leg swelling, near-syncope, orthopnea, palpitations and syncope.  Respiratory: Negative for cough, shortness of breath and wheezing.   Gastrointestinal: Negative for melena, nausea and vomiting.  Genitourinary: Negative for hematuria.  Neurological: Negative for dizziness, light-headedness and weakness.    All other systems reviewed and are otherwise negative except as noted above.  Physical Exam    VS:  BP (!) 148/70 (BP Location: Left Arm, Patient Position: Sitting, Cuff  Size: Normal)   Pulse 82   Ht 5\' 4"  (1.626 m)   Wt 186 lb 6.4 oz (84.6 kg)   SpO2 96%   BMI 32.00 kg/m  , BMI Body mass index is 32 kg/m. GEN: Well nourished, well developed, in no acute distress. HEENT: normal. Neck: Supple, no JVD, carotid bruits, or masses. Cardiac: RRR, no murmurs, rubs, or gallops. No clubbing, cyanosis, edema.  Radials/DP/PT 2+ and equal bilaterally.  Respiratory:  Respirations regular and unlabored, clear to auscultation bilaterally. GI: Soft, nontender, nondistended, BS + x 4. MS: No deformity or atrophy. Skin: Warm and dry, no rash. Neuro:  Strength and sensation are intact. Psych: Normal affect.  Assessment & Plan    1. HFpEF - Euvolemic and well compensated on exam.  Weight down an additional 3 pounds.  Echo 12/08/19 EF 60-65%, grade II diastolic dysfunction.  Continue Torsemide 20mg  daily. Continue Toprol. No spironolactone as she is euvolemic and has had hyperkalemia, AKI. Low sodium diet encouraged. Recommend <2L fluid per day.   2. Hyperkalemia - Following closely with her primary care provider.  She has stopped using salt substitute and spironolactone has been  discontinued which has led to resolution of her hyperkalemia.    3. CKD III - Continue careful titration of diuretics and antihypertensives.   4. GI bleed - Recent GI bleed admission requiring 1u PRBC. No recurrent bleeding. CBC has been stable since discharge.  She has upcoming appoint with gastroenterology.  5. PVC -resolved since addition of metoprolol.  Reports no palpitations.  6. HTN -BP elevated at home with SBP readings at home routinely >140.  She wants better control of blood pressure but is understandably hesitant regarding additional medications.  As such, increase Toprol to 25 mg daily.  Disposition: Follow up in 3 month(s) with Dr. Saunders Revel or APP  Loel Dubonnet, NP 02/20/2020, 4:28 PM

## 2020-02-20 NOTE — Patient Instructions (Signed)
Medication Instructions:  Your physician has recommended you make the following change in your medication:   CHANGE your Toprol to 25mg  (one tablet) daily  *If you need a refill on your cardiac medications before your next appointment, please call your pharmacy*  Lab Work: No lab work today.  If you have labs (blood work) drawn today and your tests are completely normal, you will receive your results only by: [name] MyChart Message (if you have MyChart) OR . A paper copy in the mail If you have any lab test that is abnormal or we need to change your treatment, we will call you to review the results.   Testing/Procedures: Your EKG today was stable.   Follow-Up: At [facility], you and your health needs are our priority.  As part of our continuing mission to provide you with exceptional heart care, we have created designated Provider Care Teams.  These Care Teams include your primary Cardiologist (physician) and Advanced Practice Providers (APPs -  Physician Assistants and Nurse Practitioners) who all work together to provide you with the care you need, when you need it.  We recommend signing up for the patient portal called "MyChart".  Sign up information is provided on this After Visit Summary.  MyChart is used to connect with patients for Virtual Visits (Telemedicine).  Patients are able to view lab/test results, encounter notes, upcoming appointments, etc.  Non-urgent messages can be sent to your provider as well.   To learn more about what you can do with MyChart, go to [email or web address].    Your next appointment:   3 month(s)  The format for your next appointment:   In Person  Provider:    You may see Nelva Bush, MD or one of the following Advanced Practice Providers on your designated Care Team:    Murray Hodgkins, NP  Christell Faith, PA-C  Laurann Montana, NP  Marrianne Mood, PA-C    Other Instructions  Keep up the good work with diet and exercise!!

## 2020-03-11 ENCOUNTER — Ambulatory Visit (INDEPENDENT_AMBULATORY_CARE_PROVIDER_SITE_OTHER): Payer: Medicare HMO | Admitting: Gastroenterology

## 2020-03-11 VITALS — BP 165/82 | HR 80 | Temp 97.7°F | Ht 64.0 in | Wt 183.0 lb

## 2020-03-11 DIAGNOSIS — Z8719 Personal history of other diseases of the digestive system: Secondary | ICD-10-CM | POA: Diagnosis not present

## 2020-03-11 DIAGNOSIS — Z8711 Personal history of peptic ulcer disease: Secondary | ICD-10-CM

## 2020-03-11 NOTE — Addendum Note (Signed)
Addended by: Dorethea Clan on: 03/11/2020 02:03 PM   Modules accepted: Orders

## 2020-03-11 NOTE — Progress Notes (Signed)
Tammie Bellows MD, MRCP(U.K) [address]  [place], [place] [address]  Main: [phone]  Fax: [phone]   Primary Care Physician: Donnamarie Rossetti, PA-C  Primary Gastroenterologist:  Dr. Harland German follow-up  HPI: Tammie [name] is a 79 y.o. female    Summary of history :  She was admitted on 12/22/2019 with melena and I was consulted to see her.  It has been ongoing for 3 days.  She was on aspirin 81 mg a day , she had been taking Aleve on a daily basis for a long time .  EGD was performed demonstrated a clean-based nonbleeding gastric ulcer no active bleeding was seen.  Admission was complicated with AKI.On 12/25/2019 hemoglobin was 8.7 g.  Interval history   12/24/2019-03/11/2020  01/09/2020 hemoglobin 10.3 g.  No more melena on iron tablets.  Doing well taking Prilosec 1 tablet a day 40 mg   Current Outpatient Medications  Medication Sig Dispense Refill  . ferrous sulfate 325 (65 FE) MG EC tablet Take 1 tablet (325 mg total) by mouth 2 (two) times daily. 60 tablet 3  . glimepiride (AMARYL) 2 MG tablet Take 2 mg by mouth daily with breakfast.     . insulin detemir (LEVEMIR) 100 UNIT/ML injection Inject 0.1 mLs (10 Units total) into the skin 2 (two) times daily. (Patient taking differently: Inject 15 Units into the skin 2 (two) times daily. ) 10 mL 1  . levothyroxine (SYNTHROID) 75 MCG tablet Take 75 mcg by mouth daily.    [name] lovastatin (MEVACOR) 20 MG tablet Take 20 mg by mouth at bedtime.    [name] MAGNESIUM OXIDE PO Take by mouth.    . metFORMIN (GLUCOPHAGE) 1000 MG tablet Take 1,000 mg by mouth 2 (two) times daily.    . metoprolol succinate (TOPROL XL) 25 MG 24 hr tablet Take 1 tablet (25 mg total) by mouth daily. 90 tablet 1  . pantoprazole (PROTONIX) 40 MG tablet Take 40 mg by mouth daily.    [name] torsemide (DEMADEX) 20 MG tablet Take 20mg  (one tablet) daily. You may take an extra tablet in the afternoon as needed for weight gain of 3 pounds  overnight or 5 pounds in one week. 45 tablet 2   No current facility-administered medications for this visit.    Allergies as of 03/11/2020 - Review Complete 02/20/2020  Allergen Reaction Noted  . Ampicillin Hives 12/02/2019  . Contrast media [iodinated diagnostic agents] Hives 12/02/2019  . Penicillin g Hives 12/02/2019  . Sulfamethoxazole-trimethoprim Hives 12/02/2019  . Tetracyclines & related Hives 12/02/2019  . Verapamil Hives 12/02/2019  . Amlodipine Rash 12/02/2019    ROS:  General: Negative for anorexia, weight loss, fever, chills, fatigue, weakness. ENT: Negative for hoarseness, difficulty swallowing , nasal congestion. CV: Negative for chest pain, angina, palpitations, dyspnea on exertion, peripheral edema.  Respiratory: Negative for dyspnea at rest, dyspnea on exertion, cough, sputum, wheezing.  GI: See history of present illness. GU:  Negative for dysuria, hematuria, urinary incontinence, urinary frequency, nocturnal urination.  Endo: Negative for unusual weight change.    Physical Examination:   There were no vitals taken for this visit.  General: Well-nourished, well-developed in no acute distress.  Eyes: No icterus. Conjunctivae pink. Neuro: Alert and oriented x 3.  Grossly intact. Skin: Warm and dry, no jaundice.   Psych: Alert and cooperative, normal mood and affect.   Imaging Studies: No results found.  Assessment and Plan:   Tammie [name]  is a 79 y.o. y/o female presented to the emergency room in hospital in July 2021 with melena and was found to have a superficial gastric ulcer hemoglobin dropped significantly greater than 2 g.  She has been taking Aleve for a long time.  Very likely an NSAID related ulcer.  No further bleeding since then.   Plan 1.  Check H. pylori stool antigen 2.  Continue PPI as long as she is on aspirin or any antiplatelet agent 3.  Avoid NSAIDs      Dr Tammie Bellows  MD,MRCP [facility]) Follow up in as needed

## 2020-03-12 LAB — H. PYLORI BREATH TEST: H pylori Breath Test: NEGATIVE

## 2020-03-15 ENCOUNTER — Encounter: Payer: Self-pay | Admitting: Gastroenterology

## 2020-05-21 ENCOUNTER — Other Ambulatory Visit: Payer: Self-pay

## 2020-05-21 ENCOUNTER — Ambulatory Visit (INDEPENDENT_AMBULATORY_CARE_PROVIDER_SITE_OTHER): Payer: Medicare HMO | Admitting: Family

## 2020-05-21 ENCOUNTER — Encounter: Payer: Self-pay | Admitting: Family

## 2020-05-21 VITALS — BP 122/60 | HR 78 | Ht 64.0 in | Wt 175.0 lb

## 2020-05-21 DIAGNOSIS — E875 Hyperkalemia: Secondary | ICD-10-CM

## 2020-05-21 DIAGNOSIS — I493 Ventricular premature depolarization: Secondary | ICD-10-CM | POA: Diagnosis not present

## 2020-05-21 DIAGNOSIS — N1832 Chronic kidney disease, stage 3b: Secondary | ICD-10-CM

## 2020-05-21 DIAGNOSIS — I5032 Chronic diastolic (congestive) heart failure: Secondary | ICD-10-CM | POA: Diagnosis not present

## 2020-05-21 DIAGNOSIS — I10 Essential (primary) hypertension: Secondary | ICD-10-CM | POA: Diagnosis not present

## 2020-05-21 MED ORDER — METOPROLOL SUCCINATE ER 25 MG PO TB24
25.0000 mg | ORAL_TABLET | Freq: Every day | ORAL | 3 refills | Status: DC
Start: 2020-05-21 — End: 2020-08-13

## 2020-05-21 MED ORDER — METOPROLOL SUCCINATE ER 25 MG PO TB24: 25.0000 mg | ORAL_TABLET | Freq: Every day | ORAL | 0 refills | Status: DC

## 2020-05-21 NOTE — Patient Instructions (Addendum)
Medication Instructions:  No medication changes today.   *If you need a refill on your cardiac medications before your next appointment, please call your pharmacy*  Lab Work: None ordered today.  Testing/Procedures: Your EKG today showed normal sinus rhythm.   Follow-Up: At [facility], you and your health needs are our priority.  As part of our continuing mission to provide you with exceptional heart care, we have created designated Provider Care Teams.  These Care Teams include your primary Cardiologist (physician) and Advanced Practice Providers (APPs -  Physician Assistants and Nurse Practitioners) who all work together to provide you with the care you need, when you need it.   Your next appointment:   6 month(s)  The format for your next appointment:   In Person  Provider:   You may see Nelva Bush, MD or one of the following Advanced Practice Providers on your designated Care Team:    Murray Hodgkins, NP  Christell Faith, PA-C  Marrianne Mood, PA-C  Cadence Cordova, [place]  Laurann Montana, NP  Other Instructions  Keep up the good work with your diet and walking to the mailbox for exercise. You've lost 11 pounds since we saw you last time which is a great accomplishment!

## 2020-05-21 NOTE — Progress Notes (Signed)
Office Visit    Patient Name: Tammie [name] Date of Encounter: 05/21/2020  Primary Care Provider:  Donnamarie Rossetti, PA-C Primary Cardiologist:  Nelva Bush, MD Electrophysiologist:  None   Chief Complaint    Tammie [name] is a 79 y.o. female with a hx of diabetes mellitus, HTN, HLD, obesity, HFpEF, GI bleed presents today for follow-up of heart failure.  Past Medical History    Past Medical History:  Diagnosis Date  . Cancer ([place])    skin  . CHF (congestive heart failure) ([place])   . Diabetes mellitus without complication ([place])   . Dysrhythmia   . Hypertension    Past Surgical History:  Procedure Laterality Date  . ABDOMINAL HYSTERECTOMY    . BREAST BIOPSY Left 2013   CORE W/CLIP - NEG  . ESOPHAGOGASTRODUODENOSCOPY (EGD) WITH PROPOFOL N/A 12/23/2019   Procedure: ESOPHAGOGASTRODUODENOSCOPY (EGD) WITH PROPOFOL;  Surgeon: Jonathon Bellows, MD;  Location: [facility] ENDOSCOPY;  Service: Gastroenterology;  Laterality: N/A;    Allergies  Allergies  Allergen Reactions  . Ampicillin Hives  . Contrast Media [Iodinated Diagnostic Agents] Hives  . Penicillin G Hives  . Sulfamethoxazole-Trimethoprim Hives  . Tetracyclines & Related Hives  . Verapamil Hives  . Amlodipine Rash   History of Present Illness    Tammie [name] is a 79 y.o. female with a hx of diabetes mellitus, HTN, HLD, obesity, HFpEF, GI bleed last seen 02/20/20.  She was [facility] to [facility] 12/08/2019 after presenting with acute on chronic HFpEF and new bilateral pleural effusions, pulmonary edema, generalized anasarca.  She initially required BiPAP and Levophed.  Her echo 12/08/19 showed normal LVEF, LVH, trivial MR, mild to moderately elevated PASP, grade 2 diastolic dysfunction.  She was treated with IV Lasix and transitioned to p.o. torsemide and spironolactone at discharge.  Her Coreg, hydrochlorothiazide, lisinopril were discontinued at discharge due to hypotension.  She was recommended for  outpatient Myoview to be discussed at follow-up in clinic due to demand ischemia.  She was discharged 12/16/2019 with weight 200lbs.  Subsequently readmitted 12/22/2019 with GI bleed.  EGD was positive for nonbleeding gastric ulcer.  She was recommended for PPI for 2 weeks and outpatient follow-up with gastroenterology.  She required 1 unit of PRBC.  She was discharged 12/25/2019 with weight 200lbs.  When seen by PCP 7/60/21 she was noted to be holding torsemide, spironolactone, lisinopril, coreg.  She was recommended to hold Metformin due to poor kidney function.  Her Metformin and torsemide have been reinitiated since that time.  She was seen in clinic 01/09/2020 and started on Toprol 12.5 mg daily due to PVCs and tachycardia.  In clinic 1 her weight was down an additional 3 pounds.  Her Toprol was increased due to elevated blood pressure.  She was seen in follow-up by her primary care provider 04/20/20 and started on lisinopril 5 mg daily.  She presents today for follow-up with her daughter-in-law.  She has lost 11 pounds since she was last seen 3 months ago.  Tells me she has been watching her diet very carefully and avoiding salt and sugar.  Her exercise tolerance is limited due to orthopedic issues.  She reports no chest pain, pressure, tightness.  Ports no shortness of breath at rest nor dyspnea on exertion.  Reports her home blood pressure readings have been at goal of less than 130/80 since addition of lisinopril 5 mg by her primary care provider  EKGs/Labs/Other Studies Reviewed:   The following studies were reviewed today:  EKG:  EKG is ordered today.  The ekg ordered today demonstrates SR 78 bpm with no acute ST/T wave changes.   Recent Labs: 12/08/2019: ALT 13; B Natriuretic Peptide 395.6 12/16/2019: Magnesium 1.6 12/25/2019: BUN 54; Creatinine, Ser 1.74; Hemoglobin 8.7; Platelets 421; Potassium 4.3; Sodium 137  Recent Lipid Panel No results found for: CHOL, TRIG, HDL, CHOLHDL, VLDL, LDLCALC,  LDLDIRECT  Home Medications   Current Meds  Medication Sig  . ferrous sulfate 325 (65 FE) MG EC tablet Take 1 tablet (325 mg total) by mouth 2 (two) times daily.  [name] glimepiride (AMARYL) 2 MG tablet Take 2 mg by mouth daily with breakfast.   . insulin degludec (TRESIBA FLEXTOUCH) 100 UNIT/ML FlexTouch Pen Inject into the skin.  [name] levothyroxine (SYNTHROID) 75 MCG tablet Take 75 mcg by mouth daily.  [name] lisinopril (ZESTRIL) 5 MG tablet Take by mouth.  . lovastatin (MEVACOR) 20 MG tablet Take 20 mg by mouth at bedtime.  [name] MAGNESIUM OXIDE PO Take by mouth.  . metFORMIN (GLUCOPHAGE) 1000 MG tablet Take 1,000 mg by mouth 2 (two) times daily.  . pantoprazole (PROTONIX) 40 MG tablet Take 40 mg by mouth daily.  [name] torsemide (DEMADEX) 20 MG tablet Take 20mg  (one tablet) daily. You may take an extra tablet in the afternoon as needed for weight gain of 3 pounds overnight or 5 pounds in one week.  . [DISCONTINUED] metoprolol succinate (TOPROL XL) 25 MG 24 hr tablet Take 1 tablet (25 mg total) by mouth daily.    Review of Systems  All other systems reviewed and are otherwise negative except as noted above.  Physical Exam   VS:  BP 122/60 (BP Location: Left Arm, Patient Position: Sitting, Cuff Size: Normal)   Pulse 78   Ht 5\' 4"  (1.626 m)   Wt 175 lb (79.4 kg)   SpO2 97%   BMI 30.04 kg/m  , BMI Body mass index is 30.04 kg/m. GEN: Well nourished, well developed, in no acute distress. HEENT: normal. Neck: Supple, no JVD, carotid bruits, or masses. Cardiac: RRR, no murmurs, rubs, or gallops. No clubbing, cyanosis, edema.  Radials/DP/PT 2+ and equal bilaterally.  Respiratory:  Respirations regular and unlabored, clear to auscultation bilaterally. GI: Soft, nontender, nondistended, BS + x 4. MS: No deformity or atrophy. Skin: Warm and dry, no rash. Neuro:  Strength and sensation are intact. Psych: Normal affect.  Assessment & Plan   1. HFpEF - Euvolemic and well compensated on exam.  She has lost  11 pounds by focusing on her diet carefully and was congratulated.  Echo 12/08/19 EF 60-65%, grade II diastolic dysfunction.  No spironolactone due to previous hyperkalemia, AKI.  GDMT includes lisinopril, metoprolol, torsemide.  Low sodium diet encouraged. Recommend <2L fluid per day.   2. Hyperkalemia -In setting of previous spironolactone use.  Most recent lab work 04/13/2020 with K3.9.  Recommend avoiding salt substitute.  3. CKD III - Continue careful titration of diuretics and antihypertensives.   4. GI bleed - No evidence of recurrence.  Tolerating over-the-counter iron supplement without difficulty. Continue to follow with PCP/GI.  5. PVC - Resolved since addition of Metoprolol.  Reports no palpitations.  Refill of metoprolol provided.  6. HTN - BP well controlled. Continue current antihypertensive regimen including lisinopril 5 mg daily, Toprol 25 mg daily.   Disposition: Follow up in 6 month(s) with Dr. Saunders Revel or APP  Loel Dubonnet, NP 05/21/2020, 1:58 PM

## 2020-05-28 NOTE — Addendum Note (Signed)
Addended by: Britt Bottom on: 05/28/2020 04:08 PM   Modules accepted: Orders

## 2020-06-05 ENCOUNTER — Observation Stay
Admission: EM | Admit: 2020-06-05 | Discharge: 2020-06-06 | Disposition: A | Discharge status: Home / Self Care | Payer: Medicare HMO | Attending: Internal Medicine | Admitting: Internal Medicine

## 2020-06-05 ENCOUNTER — Other Ambulatory Visit: Payer: Self-pay

## 2020-06-05 ENCOUNTER — Emergency Department: Payer: Medicare HMO

## 2020-06-05 ENCOUNTER — Encounter: Payer: Self-pay | Admitting: Emergency Medicine

## 2020-06-05 DIAGNOSIS — Z79899 Other long term (current) drug therapy: Secondary | ICD-10-CM | POA: Insufficient documentation

## 2020-06-05 DIAGNOSIS — Z23 Encounter for immunization: Principal | ICD-10-CM | POA: Insufficient documentation

## 2020-06-05 DIAGNOSIS — Z794 Long term (current) use of insulin: Secondary | ICD-10-CM | POA: Diagnosis not present

## 2020-06-05 DIAGNOSIS — E119 Type 2 diabetes mellitus without complications: Secondary | ICD-10-CM | POA: Diagnosis not present

## 2020-06-05 DIAGNOSIS — I5030 Unspecified diastolic (congestive) heart failure: Secondary | ICD-10-CM | POA: Diagnosis present

## 2020-06-05 DIAGNOSIS — R55 Syncope and collapse: Secondary | ICD-10-CM

## 2020-06-05 DIAGNOSIS — I110 Hypertensive heart disease with heart failure: Secondary | ICD-10-CM | POA: Diagnosis not present

## 2020-06-05 DIAGNOSIS — I10 Essential (primary) hypertension: Secondary | ICD-10-CM | POA: Diagnosis present

## 2020-06-05 DIAGNOSIS — I509 Heart failure, unspecified: Secondary | ICD-10-CM | POA: Insufficient documentation

## 2020-06-05 DIAGNOSIS — U071 COVID-19: Secondary | ICD-10-CM | POA: Diagnosis not present

## 2020-06-05 DIAGNOSIS — N179 Acute kidney failure, unspecified: Secondary | ICD-10-CM | POA: Diagnosis present

## 2020-06-05 LAB — COMPREHENSIVE METABOLIC PANEL
ALT: 19 U/L (ref 0–44)
AST: 37 U/L (ref 15–41)
Albumin: 3.5 g/dL (ref 3.5–5.0)
Alkaline Phosphatase: 47 U/L (ref 38–126)
Anion gap: 15 (ref 5–15)
BUN: 26 mg/dL — ABNORMAL HIGH (ref 8–23)
CO2: 26 mmol/L (ref 22–32)
Calcium: 8.6 mg/dL — ABNORMAL LOW (ref 8.9–10.3)
Chloride: 100 mmol/L (ref 98–111)
Creatinine, Ser: 1.52 mg/dL — ABNORMAL HIGH (ref 0.44–1.00)
GFR, Estimated: 34 mL/min — ABNORMAL LOW (ref >60)
Glucose, Bld: 107 mg/dL — ABNORMAL HIGH (ref 70–99)
Potassium: 3.4 mmol/L — ABNORMAL LOW (ref 3.5–5.1)
Sodium: 141 mmol/L (ref 135–145)
Total Bilirubin: 0.3 mg/dL (ref 0.3–1.2)
Total Protein: 6.4 g/dL — ABNORMAL LOW (ref 6.5–8.1)

## 2020-06-05 LAB — CBC WITH DIFFERENTIAL/PLATELET
Abs Immature Granulocytes: 0.02 10*3/uL (ref 0.00–0.07)
Basophils Absolute: 0 10*3/uL (ref 0.0–0.1)
Basophils Relative: 0 %
Eosinophils Absolute: 0 10*3/uL (ref 0.0–0.5)
Eosinophils Relative: 0 %
HCT: 38.7 % (ref 36.0–46.0)
Hemoglobin: 11.7 g/dL — ABNORMAL LOW (ref 12.0–15.0)
Immature Granulocytes: 0 %
Lymphocytes Relative: 30 %
Lymphs Abs: 1.8 10*3/uL (ref 0.7–4.0)
MCH: 25.7 pg — ABNORMAL LOW (ref 26.0–34.0)
MCHC: 30.2 g/dL (ref 30.0–36.0)
MCV: 85.1 fL (ref 80.0–100.0)
Monocytes Absolute: 0.7 10*3/uL (ref 0.1–1.0)
Monocytes Relative: 11 %
Neutro Abs: 3.5 10*3/uL (ref 1.7–7.7)
Neutrophils Relative %: 59 %
Platelets: 222 10*3/uL (ref 150–400)
RBC: 4.55 MIL/uL (ref 3.87–5.11)
RDW: 19.6 % — ABNORMAL HIGH (ref 11.5–15.5)
WBC: 6 10*3/uL (ref 4.0–10.5)
nRBC: 0 % (ref 0.0–0.2)

## 2020-06-05 LAB — TROPONIN I (HIGH SENSITIVITY)
Troponin I (High Sensitivity): 16 ng/L (ref <18)
Troponin I (High Sensitivity): 16 ng/L (ref <18)

## 2020-06-05 LAB — FIBRIN DERIVATIVES D-DIMER (ARMC ONLY): Fibrin derivatives D-dimer (ARMC): 745.88 ng/mL (FEU) — ABNORMAL HIGH (ref 0.00–499.00)

## 2020-06-05 LAB — PROCALCITONIN: Procalcitonin: 0.11 ng/mL

## 2020-06-05 LAB — RESP PANEL BY RT-PCR (FLU A&B, COVID) ARPGX2
Influenza A by PCR: NEGATIVE
Influenza B by PCR: NEGATIVE
SARS Coronavirus 2 by RT PCR: POSITIVE — AB

## 2020-06-05 LAB — CBG MONITORING, ED
Glucose-Capillary: 94 mg/dL (ref 70–99)
Glucose-Capillary: 177 mg/dL — ABNORMAL HIGH (ref 70–99)

## 2020-06-05 LAB — BRAIN NATRIURETIC PEPTIDE: B Natriuretic Peptide: 50.7 pg/mL (ref 0.0–100.0)

## 2020-06-05 MED ORDER — INSULIN ASPART 100 UNIT/ML ~~LOC~~ SOLN
0.0000 [IU] | Freq: Three times a day (TID) | SUBCUTANEOUS | Status: DC
  Administered 2020-06-06: 12:00:00 3 [IU] via SUBCUTANEOUS
  Filled 2020-06-05: qty 1

## 2020-06-05 MED ORDER — HYDROCOD POLST-CPM POLST ER 10-8 MG/5ML PO SUER
5.0000 mL | Freq: Two times a day (BID) | ORAL | Status: DC | PRN
  Administered 2020-06-05: 5 mL via ORAL
  Filled 2020-06-05: qty 5

## 2020-06-05 MED ORDER — ENOXAPARIN SODIUM 40 MG/0.4ML ~~LOC~~ SOLN
40.0000 mg | SUBCUTANEOUS | Status: DC
  Administered 2020-06-05: 22:00:00 40 mg via SUBCUTANEOUS
  Filled 2020-06-05: qty 0.4

## 2020-06-05 MED ORDER — ASCORBIC ACID 500 MG PO TABS
500.0000 mg | ORAL_TABLET | Freq: Every day | ORAL | Status: DC
  Administered 2020-06-05 – 2020-06-06 (×2): 500 mg via ORAL
  Filled 2020-06-05 (×2): qty 1

## 2020-06-05 MED ORDER — SODIUM CHLORIDE 0.9 % IV SOLN
100.0000 mg | Freq: Every day | INTRAVENOUS | Status: DC
  Administered 2020-06-06: 10:00:00 100 mg via INTRAVENOUS
  Filled 2020-06-05: qty 20

## 2020-06-05 MED ORDER — GLIMEPIRIDE 2 MG PO TABS
2.0000 mg | ORAL_TABLET | Freq: Every day | ORAL | Status: DC
  Administered 2020-06-06: 10:00:00 2 mg via ORAL
  Filled 2020-06-05: qty 1

## 2020-06-05 MED ORDER — DEXAMETHASONE SODIUM PHOSPHATE 10 MG/ML IJ SOLN
10.0000 mg | Freq: Once | INTRAMUSCULAR | Status: AC
  Administered 2020-06-05: 17:00:00 10 mg via INTRAVENOUS
  Filled 2020-06-05: qty 1

## 2020-06-05 MED ORDER — LEVOTHYROXINE SODIUM 50 MCG PO TABS
75.0000 ug | ORAL_TABLET | Freq: Every day | ORAL | Status: DC
  Administered 2020-06-06: 06:00:00 75 ug via ORAL
  Filled 2020-06-05 (×2): qty 2

## 2020-06-05 MED ORDER — SODIUM CHLORIDE 0.9 % IV SOLN
200.0000 mg | Freq: Once | INTRAVENOUS | Status: AC
  Administered 2020-06-05: 17:00:00 200 mg via INTRAVENOUS
  Filled 2020-06-05: qty 200

## 2020-06-05 MED ORDER — ZINC SULFATE 220 (50 ZN) MG PO CAPS
220.0000 mg | ORAL_CAPSULE | Freq: Every day | ORAL | Status: DC
  Administered 2020-06-05 – 2020-06-06 (×2): 220 mg via ORAL
  Filled 2020-06-05 (×2): qty 1

## 2020-06-05 MED ORDER — ONDANSETRON HCL 4 MG/2ML IJ SOLN: 4.0000 mg | Freq: Four times a day (QID) | INTRAMUSCULAR | Status: DC | PRN

## 2020-06-05 MED ORDER — PRAVASTATIN SODIUM 20 MG PO TABS
20.0000 mg | ORAL_TABLET | Freq: Every day | ORAL | Status: DC
  Administered 2020-06-05: 18:00:00 20 mg via ORAL
  Filled 2020-06-05 (×2): qty 1

## 2020-06-05 MED ORDER — ONDANSETRON HCL 4 MG PO TABS: 4.0000 mg | ORAL_TABLET | Freq: Four times a day (QID) | ORAL | Status: DC | PRN

## 2020-06-05 MED ORDER — FERROUS SULFATE 325 (65 FE) MG PO TABS
325.0000 mg | ORAL_TABLET | Freq: Two times a day (BID) | ORAL | Status: DC
  Administered 2020-06-06: 10:00:00 325 mg via ORAL
  Filled 2020-06-05 (×3): qty 1

## 2020-06-05 MED ORDER — SENNOSIDES-DOCUSATE SODIUM 8.6-50 MG PO TABS: 1.0000 | ORAL_TABLET | Freq: Every evening | ORAL | Status: DC | PRN

## 2020-06-05 MED ORDER — SODIUM CHLORIDE 0.9 % IV SOLN: INTRAVENOUS | Status: DC

## 2020-06-05 MED ORDER — POTASSIUM CHLORIDE 20 MEQ PO PACK
40.0000 meq | PACK | Freq: Once | ORAL | Status: AC
  Administered 2020-06-05: 16:00:00 40 meq via ORAL
  Filled 2020-06-05: qty 2

## 2020-06-05 MED ORDER — GUAIFENESIN-DM 100-10 MG/5ML PO SYRP: 10.0000 mL | ORAL_SOLUTION | ORAL | Status: DC | PRN

## 2020-06-05 MED ORDER — INSULIN DEGLUDEC 100 UNIT/ML ~~LOC~~ SOPN: 10.0000 [IU] | PEN_INJECTOR | Freq: Every day | SUBCUTANEOUS | Status: DC

## 2020-06-05 MED ORDER — DEXAMETHASONE SODIUM PHOSPHATE 10 MG/ML IJ SOLN
6.0000 mg | INTRAMUSCULAR | Status: DC
  Administered 2020-06-06: 08:00:00 6 mg via INTRAVENOUS
  Filled 2020-06-05: qty 1

## 2020-06-05 MED ORDER — SODIUM CHLORIDE 0.9 % IV SOLN: 100.0000 mg | Freq: Every day | INTRAVENOUS | Status: DC

## 2020-06-05 MED ORDER — SODIUM CHLORIDE 0.9 % IV SOLN: 200.0000 mg | Freq: Once | INTRAVENOUS | Status: DC

## 2020-06-05 NOTE — ED Notes (Signed)
Medication administered per order. Pt sat up in bed and eating meal tray at this time. NAD noted, call bell within reach at this time.

## 2020-06-05 NOTE — Consult Note (Signed)
Remdesivir - Pharmacy Brief Note   O:  ALT: 19 CXR: No acute abnormalities. SpO2: 97% on room air   A/P:  Remdesivir 200 mg IVPB once followed by 100 mg IVPB daily x 4 days.   Benn Moulder, PharmD Pharmacy Resident  06/05/2020 3:30 PM

## 2020-06-05 NOTE — ED Notes (Signed)
Admitting MD at bedside at this time.

## 2020-06-05 NOTE — ED Triage Notes (Signed)
Pt to ED via [facility] from her son's house, per EMS pt had +home covid test earlier today, was at a family function today and had 2 syncopal episodes. Per EMS pt with diarrhea x 2 days. Per EMS pt had syncopal episode while sitting, +LOC for approx 10-12 seconds, had another episode with EMS. Per EMS pt noted to be diaphoretic upon EMS arrival.   84/31 -> 95/47 after 250cc's NS CBG 141

## 2020-06-05 NOTE — ED Provider Notes (Signed)
[facility] Emergency Department Provider Note   ____________________________________________   Event Date/Time   First MD Initiated Contact with Patient 06/05/20 1355     (approximate)  I have reviewed the triage vital signs and the nursing notes.   HISTORY  Chief Complaint Loss of Consciousness    HPI Tammie [name] is a 79 y.o. female who had a positive Covid test earlier today.  She was at a family function and then passed out once prior to EMS and then once with EMS there.  Patient has had diarrhea for 2 days.  Syncope was while sitting and lasted 10 to 12 seconds.  Patient was somewhat diaphoretic when EMS arrived.  Currently she feels well.  She says she got lightheaded before it happened but did not have any chest pain or shortness of breath or other complaints.         Past Medical History:  Diagnosis Date  . Cancer (HCC)    skin  . CHF (congestive heart failure) (HCC)   . Diabetes mellitus without complication (HCC)   . Dysrhythmia   . Hypertension     Patient Active Problem List   Diagnosis Date Noted  . Melena 12/22/2019  . Gastrointestinal hemorrhage with melena 12/22/2019  . AKI (acute kidney injury) (HCC) 12/22/2019  . Pressure injury of skin 12/16/2019  . Hypercarbia 12/16/2019  . Demand ischemia (HCC)   . Acute on chronic heart failure with preserved ejection fraction (HFpEF) (HCC) 12/10/2019  . Elevated troponin 12/10/2019  . Type 2 diabetes mellitus (HCC) 12/10/2019  . Essential hypertension 12/10/2019  . Heart failure with preserved ejection fraction (HCC)   . Acute respiratory failure with hypoxia and hypercapnia (HCC) 12/08/2019    Past Surgical History:  Procedure Laterality Date  . ABDOMINAL HYSTERECTOMY    . BREAST BIOPSY Left 2013   CORE W/CLIP - NEG  . ESOPHAGOGASTRODUODENOSCOPY (EGD) WITH PROPOFOL N/A 12/23/2019   Procedure: ESOPHAGOGASTRODUODENOSCOPY (EGD) WITH PROPOFOL;  Surgeon: Wyline Mood, MD;   Location: [facility] ENDOSCOPY;  Service: Gastroenterology;  Laterality: N/A;    Prior to Admission medications   Medication Sig Start Date End Date Taking? Authorizing Provider  ferrous sulfate 325 (65 FE) MG EC tablet Take 1 tablet (325 mg total) by mouth 2 (two) times daily. 12/25/19 12/24/20 Yes Arnetha Courser, MD  glimepiride (AMARYL) 2 MG tablet Take 2 mg by mouth daily with breakfast.    Yes [provider]  insulin degludec (TRESIBA FLEXTOUCH) 100 UNIT/ML FlexTouch Pen Inject 10-15 Units into the skin at bedtime. 04/26/20 04/26/21 Yes [provider]  levothyroxine (SYNTHROID) 75 MCG tablet Take 75 mcg by mouth daily. 10/24/19  Yes [provider]  lisinopril (ZESTRIL) 5 MG tablet Take 5 mg by mouth at bedtime. 04/20/20 04/20/21 Yes [provider]  lovastatin (MEVACOR) 20 MG tablet Take 20 mg by mouth at bedtime. 10/24/19  Yes [provider]  MAGNESIUM OXIDE PO Take 400 mg by mouth daily.   Yes [provider]  metFORMIN (GLUCOPHAGE) 1000 MG tablet Take 1,000 mg by mouth 2 (two) times daily. 10/24/19  Yes [provider]  metoprolol succinate (TOPROL XL) 25 MG 24 hr tablet Take 1 tablet (25 mg total) by mouth daily. 05/21/20  Yes Alver Sorrow, NP  pantoprazole (PROTONIX) 40 MG tablet Take 40 mg by mouth daily. 02/14/20  Yes [provider]  torsemide (DEMADEX) 20 MG tablet Take 20mg  (one tablet) daily. You may take an extra tablet in the afternoon  as needed for weight gain of 3 pounds overnight or 5 pounds in one week. 01/09/20  Yes Loel Dubonnet, NP    Allergies Ampicillin, Contrast media [iodinated diagnostic agents], Penicillin g, Sulfamethoxazole-trimethoprim, Tetracyclines & related, Verapamil, and Amlodipine  Family History  Problem Relation Age of Onset  . Breast cancer Other   . Breast cancer Sister 64    Social History Social History   Tobacco Use  . Smoking status: Never Smoker  . Smokeless tobacco:  Never Used  Substance Use Topics  . Alcohol use: Not Currently  . Drug use: Never    Review of Systems  Constitutional: No fever/chills Eyes: No visual changes. ENT: No sore throat. Cardiovascular: Denies chest pain. Respiratory: Denies shortness of breath. Gastrointestinal: No abdominal pain.  No nausea, no vomiting.   diarrhea.  No constipation. Genitourinary: Negative for dysuria. Musculoskeletal: Negative for back pain. Skin: Negative for rash. Neurological: Negative for headaches, focal weakness   ____________________________________________   PHYSICAL EXAM:  VITAL SIGNS: ED Triage Vitals  Enc Vitals Group     BP 06/05/20 1352 (!) 104/49     Pulse Rate 06/05/20 1352 69     Resp 06/05/20 1352 16     Temp 06/05/20 1352 97.8 F (36.6 C)     Temp Source 06/05/20 1352 Oral     SpO2 06/05/20 1352 100 %     Weight 06/05/20 1342 175 lb (79.4 kg)     Height 06/05/20 1342 5\' 4"  (1.626 m)     Head Circumference --      Peak Flow --      Pain Score 06/05/20 1342 0     Pain Loc --      Pain Edu? --      Excl. in [place]? --     Constitutional: Alert and oriented. Well appearing and in no acute distress. Eyes: Conjunctivae are normal. PER Head: Atraumatic. Nose: No congestion/rhinnorhea. Mouth/Throat: Mucous membranes are moist.  Oropharynx non-erythematous. Neck: No stridor.  Cardiovascular: Normal rate, regular rhythm. Grossly normal heart sounds.  Good peripheral circulation. Respiratory: Normal respiratory effort.  No retractions. Lungs CTAB. Gastrointestinal: Soft and nontender. No distention. No abdominal bruits. No CVA tenderness. Musculoskeletal: No lower extremity tenderness trace bilateral edema.   Neurologic:  Normal speech and language. No gross focal neurologic deficits are appreciated.  Skin:  Skin is warm, dry and intact. No rash noted.   ____________________________________________   LABS (all labs ordered are listed, but only abnormal results are  displayed)  Labs Reviewed  COMPREHENSIVE METABOLIC PANEL - Abnormal; Notable for the following components:      Result Value   Potassium 3.4 (*)    Glucose, Bld 107 (*)    BUN 26 (*)    Creatinine, Ser 1.52 (*)    Calcium 8.6 (*)    Total Protein 6.4 (*)    GFR, Estimated 34 (*)    All other components within normal limits  CBC WITH DIFFERENTIAL/PLATELET - Abnormal; Notable for the following components:   Hemoglobin 11.7 (*)    MCH 25.7 (*)    RDW 19.6 (*)    All other components within normal limits  RESP PANEL BY RT-PCR (FLU A&B, COVID) ARPGX2  BRAIN NATRIURETIC PEPTIDE  TROPONIN I (HIGH SENSITIVITY)   ____________________________________________  EKG  EKG read interpreted by me shows normal sinus rhythm rate of 66 normal axis no acute ST-T changes ____________________________________________  RADIOLOGY Gertha Calkin, personally viewed and evaluated these images (plain radiographs) as  part of my medical decision making, as well as reviewing the written report by the radiologist.  ED MD interpretation: EKG read by radiology reviewed by me is negative  Official radiology report(s): DG Chest Portable 1 View  Result Date: 06/05/2020 CLINICAL DATA:  Syncope, COVID-19 positive, diarrhea for 2 days, loss of consciousness today while sitting with episode of approximately 10-12 seconds in duration, history CHF, diabetes mellitus, hypertension EXAM: PORTABLE CHEST 1 VIEW COMPARISON:  Portable exam 1401 hours compared to 12/22/2019 FINDINGS: Normal heart size, mediastinal contours, and pulmonary vascularity. Atherosclerotic calcification aorta. Lungs clear. No infiltrate, pleural effusion or pneumothorax. Bones demineralized. IMPRESSION: No acute abnormalities. Electronically Signed   By: Lavonia Dana M.D.   On: 06/05/2020 14:18    ____________________________________________   PROCEDURES  Procedure(s) performed (including Critical  Care):  Procedures   ____________________________________________   INITIAL IMPRESSION / ASSESSMENT AND PLAN / ED COURSE  Patient reported Covid positive by home test.  She had 2 episodes of syncope with diarrhea today.  We will plan on giving her gentle hydration as she has a history of CHF and in getting her in the hospital.  I will send stool samples.  She may have just been dehydrated which is old enough that she could have had an arrhythmia easily.  She again has a history of CHF which should be a risk factor for that.  She does not have any chest pain or shortness of breath.           ____________________________________________   FINAL CLINICAL IMPRESSION(S) / ED DIAGNOSES  Final diagnoses:  Syncope and collapse  COVID     ED Discharge Orders    None      *Please note:  Vallerie B Decelles was evaluated in Emergency Department on 06/05/2020 for the symptoms described in the history of present illness. She was evaluated in the context of the global COVID-19 pandemic, which necessitated consideration that the patient might be at risk for infection with the SARS-CoV-2 virus that causes COVID-19. Institutional protocols and algorithms that pertain to the evaluation of patients at risk for COVID-19 are in a state of rapid change based on information released by regulatory bodies including the CDC and federal and state organizations. These policies and algorithms were followed during the patient's care in the ED.  Some ED evaluations and interventions may be delayed as a result of limited staffing during and the pandemic.*   Note:  This document was prepared using Dragon voice recognition software and may include unintentional dictation errors.    Nena Polio, MD 06/05/20 [phone]

## 2020-06-05 NOTE — ED Notes (Signed)
Admitting MD messaged via secure chat regarding patient's family requesting to speak with him regarding Remdesivir vs MAB infusion.

## 2020-06-05 NOTE — H&P (Addendum)
History and Physical    LONDIN ANTONE IHK:[id number] DOB: 1941-03-11 DOA: 06/05/2020  PCP: Donnamarie Rossetti, PA-C   Patient coming from:  Home  I have personally briefly reviewed patient's old medical records in [facility].  Chief Complaint: Diarrhea followed by syncope. COVD +.  HPI: Tammie [name] is a 79 y.o. female with medical history significant for type 2 diabetes, hypertension, heart failure with preserved EF, history of GI bleed in the past, presents today with 2 syncopal episodes after having diarrhea.  Patient reports going to a party where she was hugged by one of the family member who got positive with Covid.  She reports having diarrhea for last 3 days which is getting worse.  She reports having 5-6 loose watery stools , She denies any blood in the stools.  She reports feeling weak and tired.  Today after having bowel movement she passed out twice,  lasting 10 to 15 seconds.  She denies any fall, head injury, cough, chest pain, shortness of breath, fever, chills. Her home Covid test is positive.  She was brought in the emergency room for generalized weakness, diarrhea and syncopal episodes.  Her Covid test is positive in the hospital.  Patient is vaccinated against Covid.  ED Course: She was slightly hypotensive other vitals were stable.  Heart rate 66, respiratory rate 16, blood pressure 104/49,  O2 saturation 98% on room air. Labs includes sodium 141, potassium 3.4, chloride 100, bicarb 26, BUN 26, creatinine 1.52, glucose 107, calcium 8.6, anion gap 15, alkaline phosphatase 47, AST 37, ALT 19, total protein 6.4, BNP 50.7, troponin 15, WBC 6.0, hemoglobin 11.7, hematocrit 38.7, platelet 222, RSV negative, influenza negative, Covid positive.  Chest x-ray no infiltrate, no pneumothorax no abnormalities.  Review of Systems: Review of Systems  Constitutional: Negative.        Generalized weakness tiredness  HENT: Negative.   Eyes: Negative.    Respiratory: Negative.   Cardiovascular: Negative.   Gastrointestinal: Positive for diarrhea.  Genitourinary: Negative.   Musculoskeletal: Negative.   Skin: Negative.   Neurological: Positive for dizziness.       Syncope  Endo/Heme/Allergies: Negative.   Psychiatric/Behavioral: Negative.     Past Medical History:  Diagnosis Date  . Cancer ([place])    skin  . CHF (congestive heart failure) ([place])   . Diabetes mellitus without complication ([place])   . Dysrhythmia   . Hypertension     Past Surgical History:  Procedure Laterality Date  . ABDOMINAL HYSTERECTOMY    . BREAST BIOPSY Left 2013   CORE W/CLIP - NEG  . ESOPHAGOGASTRODUODENOSCOPY (EGD) WITH PROPOFOL N/A 12/23/2019   Procedure: ESOPHAGOGASTRODUODENOSCOPY (EGD) WITH PROPOFOL;  Surgeon: Jonathon Bellows, MD;  Location: [facility] ENDOSCOPY;  Service: Gastroenterology;  Laterality: N/A;     reports that she has never smoked. She has never used smokeless tobacco. She reports previous alcohol use. She reports that she does not use drugs.  Allergies  Allergen Reactions  . Ampicillin Hives  . Contrast Media [Iodinated Diagnostic Agents] Hives  . Penicillin G Hives  . Sulfamethoxazole-Trimethoprim Hives  . Tetracyclines & Related Hives  . Verapamil Hives  . Amlodipine Rash    Family History  Problem Relation Age of Onset  . Breast cancer Other   . Breast cancer Sister 20    Family history reviewed and not pertinent .  Prior to Admission medications   Medication Sig Start Date End Date Taking? Authorizing Provider  ferrous sulfate 325 (65 FE) MG EC  tablet Take 1 tablet (325 mg total) by mouth 2 (two) times daily. 12/25/19 12/24/20 Yes Lorella Nimrod, MD  glimepiride (AMARYL) 2 MG tablet Take 2 mg by mouth daily with breakfast.    Yes [provider]  insulin degludec (TRESIBA FLEXTOUCH) 100 UNIT/ML FlexTouch Pen Inject 10-15 Units into the skin at bedtime. 04/26/20 04/26/21 Yes [provider]  levothyroxine  (SYNTHROID) 75 MCG tablet Take 75 mcg by mouth daily. 10/24/19  Yes [provider]  lisinopril (ZESTRIL) 5 MG tablet Take 5 mg by mouth at bedtime. 04/20/20 04/20/21 Yes [provider]  lovastatin (MEVACOR) 20 MG tablet Take 20 mg by mouth at bedtime. 10/24/19  Yes [provider]  MAGNESIUM OXIDE PO Take 400 mg by mouth daily.   Yes [provider]  metFORMIN (GLUCOPHAGE) 1000 MG tablet Take 1,000 mg by mouth 2 (two) times daily. 10/24/19  Yes [provider]  metoprolol succinate (TOPROL XL) 25 MG 24 hr tablet Take 1 tablet (25 mg total) by mouth daily. 05/21/20  Yes Loel Dubonnet, NP  pantoprazole (PROTONIX) 40 MG tablet Take 40 mg by mouth daily. 02/14/20  Yes [provider]  torsemide (DEMADEX) 20 MG tablet Take 20mg  (one tablet) daily. You may take an extra tablet in the afternoon as needed for weight gain of 3 pounds overnight or 5 pounds in one week. 01/09/20  Yes Loel Dubonnet, NP    Physical Exam: Vitals:   06/05/20 1352 06/05/20 1400 06/05/20 1430 06/05/20 1500  BP: (!) 104/49 (!) 101/53 (!) 111/52 (!) 110/59  Pulse: 69 61 65 66  Resp: 16 13 15 16   Temp: 97.8 F (36.6 C)     TempSrc: Oral     SpO2: 100% 100% 100% 98%  Weight:      Height:        Constitutional: NAD, calm, comfortable Vitals:   06/05/20 1352 06/05/20 1400 06/05/20 1430 06/05/20 1500  BP: (!) 104/49 (!) 101/53 (!) 111/52 (!) 110/59  Pulse: 69 61 65 66  Resp: 16 13 15 16   Temp: 97.8 F (36.6 C)     TempSrc: Oral     SpO2: 100% 100% 100% 98%  Weight:      Height:       Eyes: PERRL, lids and conjunctivae normal ENMT: Mucous membranes are moist. Posterior pharynx clear of any exudate or lesions.Normal dentition.  Neck: normal, supple, no masses, no thyromegaly Respiratory: clear to auscultation bilaterally, no wheezing, no crackles. Normal respiratory effort. No accessory muscle use.  Cardiovascular: Regular rate and rhythm, no murmurs / rubs /  gallops. No extremity edema. 2+ pedal pulses. No carotid bruits.  Abdomen: no tenderness, no masses palpated. No hepatosplenomegaly. Bowel sounds positive.  Musculoskeletal: no clubbing / cyanosis. No joint deformity upper and lower extremities. Good ROM, no contractures. Normal muscle tone.  Skin: no rashes, lesions, ulcers. No induration Neurologic: CN 2-12 grossly intact. Sensation intact, DTR normal. Strength 5/5 in all 4.  Psychiatric: Normal judgment and insight. Alert and oriented x 3. Normal mood.     Labs on Admission: I have personally reviewed following labs and imaging studies  CBC: Recent Labs  Lab 06/05/20 1353  WBC 6.0  NEUTROABS 3.5  HGB 11.7*  HCT 38.7  MCV 85.1  PLT [id number]   Basic Metabolic Panel: Recent Labs  Lab 06/05/20 1353  NA 141  K 3.4*  CL 100  CO2 26  GLUCOSE 107*  BUN 26*  CREATININE 1.52*  CALCIUM 8.6*  GFR: Estimated Creatinine Clearance: 30.1 mL/min (A) (by C-G formula based on SCr of 1.52 mg/dL (H)). Liver Function Tests: Recent Labs  Lab 06/05/20 1353  AST 37  ALT 19  ALKPHOS 47  BILITOT 0.3  PROT 6.4*  ALBUMIN 3.5   No results for input(s): LIPASE, AMYLASE in the last 168 hours. No results for input(s): AMMONIA in the last 168 hours. Coagulation Profile: No results for input(s): INR, PROTIME in the last 168 hours. Cardiac Enzymes: No results for input(s): CKTOTAL, CKMB, CKMBINDEX, TROPONINI in the last 168 hours. BNP (last 3 results) No results for input(s): PROBNP in the last 8760 hours. HbA1C: No results for input(s): HGBA1C in the last 72 hours. CBG: No results for input(s): GLUCAP in the last 168 hours. Lipid Profile: No results for input(s): CHOL, HDL, LDLCALC, TRIG, CHOLHDL, LDLDIRECT in the last 72 hours. Thyroid Function Tests: No results for input(s): TSH, T4TOTAL, FREET4, T3FREE, THYROIDAB in the last 72 hours. Anemia Panel: No results for input(s): VITAMINB12, FOLATE, FERRITIN, TIBC, IRON, RETICCTPCT in the  last 72 hours. Urine analysis: No results found for: COLORURINE, APPEARANCEUR, LABSPEC, [place], GLUCOSEU, HGBUR, BILIRUBINUR, KETONESUR, PROTEINUR, UROBILINOGEN, NITRITE, LEUKOCYTESUR  Radiological Exams on Admission: DG Chest Portable 1 View  Result Date: 06/05/2020 CLINICAL DATA:  Syncope, COVID-19 positive, diarrhea for 2 days, loss of consciousness today while sitting with episode of approximately 10-12 seconds in duration, history CHF, diabetes mellitus, hypertension EXAM: PORTABLE CHEST 1 VIEW COMPARISON:  Portable exam 1401 hours compared to 12/22/2019 FINDINGS: Normal heart size, mediastinal contours, and pulmonary vascularity. Atherosclerotic calcification aorta. Lungs clear. No infiltrate, pleural effusion or pneumothorax. Bones demineralized. IMPRESSION: No acute abnormalities. Electronically Signed   By: Lavonia Dana M.D.   On: 06/05/2020 14:18    EKG: Independently reviewed.  Normal sinus rhythm.  No ST-T wave changes.  Assessment/Plan Principal Problem:   Syncope and collapse Active Problems:   Heart failure with preserved ejection fraction (HCC)   Type 2 diabetes mellitus (HCC)   Essential hypertension   AKI (acute kidney injury) ([place])   COVID-19 virus infection    COVID+ Diarrhea, generalized weakness, could be secondary to Covid: Patient seems hemodynamically stable, denies any fever, chills, cough and shortness of breath. Patient is on room air, sats 98%. Not hypoxic. She reports diarrhea for 3 days which is progressively getting worse associated with weakness. Her Covid test at home is positive, Covid test in the hospital is positive We will continue droplet and airborne precautions. Continue remdesivir and Decadron per protocol. We will continue to monitor clinical status. C. difficile and GI panel pending. Patient has received 2 doses of Covid vaccine.  Syncopal episode could be sec. to dehydration/volume loss: Patient reports syncopal episode twice after  diarrhea. Will give gentle IV hydration. 2D echo 6/21: LVEF 60 to 65%.  No regional wall motion abnormalities.  Acute kidney injury could be secondary to diarrhea: Continue gentle hydration, avoid nephrotoxic medications,  Hold torsemide and lisinopril. will recheck labs tomorrow.  Hypokalemia:  Recheck labs,  potassium replacement in progress  Hypertension: We will hold lisinopril and metoprolol.   Blood pressure is on soft side.  Diabetes mellitus: Hold Metformin. Regular insulin sliding scale,  Obtain hemoglobin A1c.  Hyperlipidemia Continue statins.  CHF with preserved EF: Echo 06/21: LVEF 60 to 65%. BNP 50.7, Trop 16   Hypothyroidism: Continue levothyroxine  GERD: Continue famotidine.   DVT prophylaxis: Lovenox Code Status: Full code Family Communication: No family at bedside  Disposition Plan:   Status is:  Inpatient  Remains inpatient appropriate because:Inpatient level of care appropriate due to severity of illness   Dispo: The patient is from: Home              Anticipated d/c is to: Home              Anticipated d/c date is: 3 days              Patient currently is not medically stable to d/c.  Consults called: None Admission status: Inpatient   Shawna Clamp MD [facility]   If 7PM-7AM, please contact night-coverage [email or web address]   06/05/2020, 3:59 PM

## 2020-06-05 NOTE — ED Notes (Signed)
Pt assisted to be repositioned in bed at this time. NAD noted. VSS. Covid swab collected by this RN and sent to lab.

## 2020-06-05 NOTE — ED Notes (Signed)
Pt provided with water with MD permission at this time. Pt gave verbal permission for pt's son Tammie [name] to be updated.

## 2020-06-05 NOTE — ED Notes (Signed)
  Patient assisted to commode in room.  No SOB or difficulty ambulating.

## 2020-06-06 ENCOUNTER — Encounter: Payer: Self-pay | Admitting: Family Medicine

## 2020-06-06 DIAGNOSIS — U071 COVID-19: Secondary | ICD-10-CM

## 2020-06-06 DIAGNOSIS — R55 Syncope and collapse: Secondary | ICD-10-CM | POA: Diagnosis not present

## 2020-06-06 DIAGNOSIS — N179 Acute kidney failure, unspecified: Secondary | ICD-10-CM | POA: Diagnosis not present

## 2020-06-06 DIAGNOSIS — I10 Essential (primary) hypertension: Secondary | ICD-10-CM | POA: Diagnosis not present

## 2020-06-06 DIAGNOSIS — I5032 Chronic diastolic (congestive) heart failure: Secondary | ICD-10-CM

## 2020-06-06 LAB — COMPREHENSIVE METABOLIC PANEL
ALT: 20 U/L (ref 0–44)
AST: 26 U/L (ref 15–41)
Albumin: 3.5 g/dL (ref 3.5–5.0)
Alkaline Phosphatase: 49 U/L (ref 38–126)
Anion gap: 13 (ref 5–15)
BUN: 23 mg/dL (ref 8–23)
CO2: 27 mmol/L (ref 22–32)
Calcium: 8.6 mg/dL — ABNORMAL LOW (ref 8.9–10.3)
Chloride: 100 mmol/L (ref 98–111)
Creatinine, Ser: 1.09 mg/dL — ABNORMAL HIGH (ref 0.44–1.00)
GFR, Estimated: 51 mL/min — ABNORMAL LOW (ref >60)
Glucose, Bld: 124 mg/dL — ABNORMAL HIGH (ref 70–99)
Potassium: 4.2 mmol/L (ref 3.5–5.1)
Sodium: 140 mmol/L (ref 135–145)
Total Bilirubin: 0.4 mg/dL (ref 0.3–1.2)
Total Protein: 6.7 g/dL (ref 6.5–8.1)

## 2020-06-06 LAB — CBG MONITORING, ED
Glucose-Capillary: 129 mg/dL — ABNORMAL HIGH (ref 70–99)
Glucose-Capillary: 101 mg/dL — ABNORMAL HIGH (ref 70–99)
Glucose-Capillary: 230 mg/dL — ABNORMAL HIGH (ref 70–99)

## 2020-06-06 LAB — HEMOGLOBIN A1C
Hgb A1c MFr Bld: 6.3 % — ABNORMAL HIGH (ref 4.8–5.6)
Mean Plasma Glucose: 134.11 mg/dL

## 2020-06-06 LAB — C-REACTIVE PROTEIN: CRP: 0.8 mg/dL (ref <1.0)

## 2020-06-06 LAB — FERRITIN: Ferritin: 60 ng/mL (ref 11–307)

## 2020-06-06 MED ORDER — SODIUM CHLORIDE 0.9 % IV SOLN: INTRAVENOUS | Status: DC | PRN

## 2020-06-06 MED ORDER — METHYLPREDNISOLONE SODIUM SUCC 125 MG IJ SOLR: 125.0000 mg | Freq: Once | INTRAMUSCULAR | Status: DC | PRN

## 2020-06-06 MED ORDER — FAMOTIDINE IN NACL 20-0.9 MG/50ML-% IV SOLN: 20.0000 mg | Freq: Once | INTRAVENOUS | Status: DC | PRN

## 2020-06-06 MED ORDER — SODIUM CHLORIDE 0.9 % IV SOLN
Freq: Once | INTRAVENOUS | Status: AC
  Filled 2020-06-06: qty 20

## 2020-06-06 MED ORDER — SODIUM CHLORIDE 0.9 % IV SOLN: 1200.0000 mg | Freq: Once | INTRAVENOUS | Status: DC

## 2020-06-06 MED ORDER — EPINEPHRINE 0.3 MG/0.3ML IJ SOAJ: 0.3000 mg | Freq: Once | INTRAMUSCULAR | Status: DC | PRN

## 2020-06-06 MED ORDER — DIPHENHYDRAMINE HCL 50 MG/ML IJ SOLN: 50.0000 mg | Freq: Once | INTRAMUSCULAR | Status: DC | PRN

## 2020-06-06 MED ORDER — METOPROLOL SUCCINATE ER 50 MG PO TB24
25.0000 mg | ORAL_TABLET | Freq: Every day | ORAL | Status: DC
Start: 2020-06-06 — End: 2020-06-06
  Administered 2020-06-06: 11:00:00 25 mg via ORAL
  Filled 2020-06-06: qty 1

## 2020-06-06 MED ORDER — ALBUTEROL SULFATE HFA 108 (90 BASE) MCG/ACT IN AERS
2.0000 | INHALATION_SPRAY | Freq: Once | RESPIRATORY_TRACT | Status: DC | PRN
  Filled 2020-06-06: qty 6.7

## 2020-06-06 NOTE — Discharge Summary (Signed)
Physician Discharge Summary  Tammie [name] [id number] DOB: [birth year] DOA: 06/05/2020  PCP: Donnamarie Rossetti, PA-C  Admit date: 06/05/2020 Discharge date: 06/06/2020  Discharge disposition: Home   Recommendations for Outpatient Follow-Up:   Follow-up with PCP in 1 week   Discharge Diagnosis:   Principal Problem:   Syncope and collapse Active Problems:   Heart failure with preserved ejection fraction (HCC)   Type 2 diabetes mellitus ([place])   Essential hypertension   AKI (acute kidney injury) ([place])   COVID-19 virus infection    Discharge Condition: Stable.  Diet recommendation:  Diet Order            Diet heart healthy/carb modified Room service appropriate? Yes; Fluid consistency: Thin  Diet effective now           Diet - low sodium heart healthy           Diet Carb Modified                   Code Status: DNR     Hospital Course:   Ms. Tammie [name] is a 79 year old woman with medical history significant for type II DM, CKD stage III, hypertension, chronic diastolic CHF, history of GI bleed, history of skin cancer, who presented to the hospital because of syncope and diarrhea.  She said she had attended a party and had interacted with people who were tested positive for Covid.  She complained of diarrhea which had gotten worse over the past 3 days.  She passed out twice after moving her bowels on the day of admission.  She complained of generalized weakness and fatigue.  She was brought to the hospital for further evaluation.  She tested positive for COVID-19 infection in the ED.  She was found to have dehydration and AKI and she was given IV fluids for this.  She was initially given IV remdesivir and IV dexamethasone.  However these were discontinued because she did not have any evidence of pneumonia on chest x-ray and she was not hypoxic.  She was given monoclonal antibody infusion (bamlanivimab/etesevimab infusion) in the emergency  room.  Risks and benefits of the monoclonal antibody infusion were discussed and patient was agreeable to treatment.  She tolerated the infusion well without any adverse effects in the emergency room.  She feels much better.  Diarrhea has resolved.  Creatinine has improved and she is deemed stable for discharge to home today.  Discharge plan was discussed with his son, Mr. Alison Antrim.     Discharge Exam:    Vitals:   06/06/20 1247 06/06/20 1318 06/06/20 1330 06/06/20 1400  BP: 134/71 138/66 (!) 166/81 (!) 161/67  Pulse: 69 62 68 62  Resp: 18 18 18 16   Temp:      TempSrc:      SpO2: 95% 94% 95% 97%  Weight:      Height:         GEN: NAD SKIN: No rash EYES: EOMI, hearing impairment ENT: MMM CV: RRR PULM: CTA B ABD: soft, obese, NT, +BS CNS: AAO x 3, non focal EXT: No edema or tenderness   The results of significant diagnostics from this hospitalization (including imaging, microbiology, ancillary and laboratory) are listed below for reference.     Procedures and Diagnostic Studies:   DG Chest Portable 1 View  Result Date: 06/05/2020 CLINICAL DATA:  Syncope, COVID-19 positive, diarrhea for 2 days, loss of consciousness today while sitting with episode of approximately 10-12 seconds in duration,  history CHF, diabetes mellitus, hypertension EXAM: PORTABLE CHEST 1 VIEW COMPARISON:  Portable exam 1401 hours compared to 12/22/2019 FINDINGS: Normal heart size, mediastinal contours, and pulmonary vascularity. Atherosclerotic calcification aorta. Lungs clear. No infiltrate, pleural effusion or pneumothorax. Bones demineralized. IMPRESSION: No acute abnormalities. Electronically Signed   By: Lavonia Dana M.D.   On: 06/05/2020 14:18     Labs:   Basic Metabolic Panel: Recent Labs  Lab 06/05/20 1353 06/06/20 0540  NA 141 140  K 3.4* 4.2  CL 100 100  CO2 26 27  GLUCOSE 107* 124*  BUN 26* 23  CREATININE 1.52* 1.09*  CALCIUM 8.6* 8.6*   GFR Estimated Creatinine  Clearance: 42 mL/min (A) (by C-G formula based on SCr of 1.09 mg/dL (H)). Liver Function Tests: Recent Labs  Lab 06/05/20 1353 06/06/20 0540  AST 37 26  ALT 19 20  ALKPHOS 47 49  BILITOT 0.3 0.4  PROT 6.4* 6.7  ALBUMIN 3.5 3.5   No results for input(s): LIPASE, AMYLASE in the last 168 hours. No results for input(s): AMMONIA in the last 168 hours. Coagulation profile No results for input(s): INR, PROTIME in the last 168 hours.  CBC: Recent Labs  Lab 06/05/20 1353  WBC 6.0  NEUTROABS 3.5  HGB 11.7*  HCT 38.7  MCV 85.1  PLT 222   Cardiac Enzymes: No results for input(s): CKTOTAL, CKMB, CKMBINDEX, TROPONINI in the last 168 hours. BNP: Invalid input(s): POCBNP CBG: Recent Labs  Lab 06/05/20 1726 06/05/20 2201 06/06/20 0526 06/06/20 0740 06/06/20 1142  GLUCAP 94 177* 129* 101* 230*   D-Dimer No results for input(s): DDIMER in the last 72 hours. Hgb A1c Recent Labs    06/05/20 1632  HGBA1C 6.3*   Lipid Profile No results for input(s): CHOL, HDL, LDLCALC, TRIG, CHOLHDL, LDLDIRECT in the last 72 hours. Thyroid function studies No results for input(s): TSH, T4TOTAL, T3FREE, THYROIDAB in the last 72 hours.  Invalid input(s): FREET3 Anemia work up Recent Labs    06/06/20 0540  FERRITIN 60   Microbiology Recent Results (from the past 240 hour(s))  Resp Panel by RT-PCR (Flu A&B, Covid) Nasopharyngeal Swab     Status: Abnormal   Collection Time: 06/05/20  2:51 PM   Specimen: Nasopharyngeal Swab; Nasopharyngeal(NP) swabs in vial transport medium  Result Value Ref Range Status   SARS Coronavirus 2 by RT PCR POSITIVE (A) NEGATIVE Final    Comment: RESULT CALLED TO, READ BACK BY AND VERIFIED WITH: MEGAN JONES 06/05/20 AT 1552 BY ACR (NOTE) SARS-CoV-2 target nucleic acids are DETECTED.  The SARS-CoV-2 RNA is generally detectable in upper respiratory specimens during the acute phase of infection. Positive results are indicative of the presence of the identified  virus, but do not rule out bacterial infection or co-infection with other pathogens not detected by the test. Clinical correlation with patient history and other diagnostic information is necessary to determine patient infection status. The expected result is Negative.  Fact Sheet for Patients: EntrepreneurPulse.com.au  Fact Sheet for Healthcare Providers: IncredibleEmployment.be  This test is not yet approved or cleared by the [place] FDA and  has been authorized for detection and/or diagnosis of SARS-CoV-2 by FDA under an Emergency Use Authorization (EUA).  This EUA will remain in effect (meaning this test can  be used) for the duration of  the COVID-19 declaration under Section 564(b)(1) of the Act, 21 U.S.C. section 360bbb-3(b)(1), unless the authorization is terminated or revoked sooner.     Influenza A by PCR NEGATIVE NEGATIVE Final  Influenza B by PCR NEGATIVE NEGATIVE Final    Comment: (NOTE) The Xpert Xpress SARS-CoV-2/FLU/RSV plus assay is intended as an aid in the diagnosis of influenza from Nasopharyngeal swab specimens and should not be used as a sole basis for treatment. Nasal washings and aspirates are unacceptable for Xpert Xpress SARS-CoV-2/FLU/RSV testing.  Fact Sheet for Patients: EntrepreneurPulse.com.au  Fact Sheet for Healthcare Providers: IncredibleEmployment.be  This test is not yet approved or cleared by the [place] FDA and has been authorized for detection and/or diagnosis of SARS-CoV-2 by FDA under an Emergency Use Authorization (EUA). This EUA will remain in effect (meaning this test can be used) for the duration of the COVID-19 declaration under Section 564(b)(1) of the Act, 21 U.S.C. section 360bbb-3(b)(1), unless the authorization is terminated or revoked.  Performed at [facility], [address] [place], [place] [address]      Discharge  Instructions:   Discharge Instructions    Diet - low sodium heart healthy   Complete by: As directed    Diet Carb Modified   Complete by: As directed    Discharge instructions   Complete by: As directed    Isolate at home for 10 days.   ?   Person Under Monitoring Name: Tammie [name]  Location: [place] [address]   Infection Prevention Recommendations for Individuals Confirmed to have, or Being Evaluated for, 2019 Novel Coronavirus (COVID-19) Infection Who Receive Care at Home  Individuals who are confirmed to have, or are being evaluated for, COVID-19 should follow the prevention steps below until a healthcare provider or local or state health department says they can return to normal activities.  Stay home except to get medical care You should restrict activities outside your home, except for getting medical care. Do not go to work, school, or public areas, and do not use public transportation or taxis.  Call ahead before visiting your doctor Before your medical appointment, call the healthcare provider and tell them that you have, or are being evaluated for, COVID-19 infection. This will help the healthcare provider's office take steps to keep other people from getting infected. Ask your healthcare provider to call the local or state health department.  Monitor your symptoms Seek prompt medical attention if your illness is worsening (e.g., difficulty breathing). Before going to your medical appointment, call the healthcare provider and tell them that you have, or are being evaluated for, COVID-19 infection. Ask your healthcare provider to call the local or state health department.  Wear a facemask You should wear a facemask that covers your nose and mouth when you are in the same room with other people and when you visit a healthcare provider. People who live with or visit you should also wear a facemask while they are in the same room  with you.  Separate yourself from other people in your home As much as possible, you should stay in a different room from other people in your home. Also, you should use a separate bathroom, if available.  Avoid sharing household items You should not share dishes, drinking glasses, cups, eating utensils, towels, bedding, or other items with other people in your home. After using these items, you should wash them thoroughly with soap and water.  Cover your coughs and sneezes Cover your mouth and nose with a tissue when you cough or sneeze, or you can cough or sneeze into your sleeve. Throw used tissues in a lined trash can, and immediately wash your hands  with soap and water for at least 20 seconds or use an alcohol-based hand rub.  Wash your [facility] your hands often and thoroughly with soap and water for at least 20 seconds. You can use an alcohol-based hand sanitizer if soap and water are not available and if your hands are not visibly dirty. Avoid touching your eyes, nose, and mouth with unwashed hands.   Prevention Steps for Caregivers and Household Members of Individuals Confirmed to have, or Being Evaluated for, COVID-19 Infection Being Cared for in the Home  If you live with, or provide care at home for, a person confirmed to have, or being evaluated for, COVID-19 infection please follow these guidelines to prevent infection:  Follow healthcare provider's instructions Make sure that you understand and can help the patient follow any healthcare provider instructions for all care.  Provide for the patient's basic needs You should help the patient with basic needs in the home and provide support for getting groceries, prescriptions, and other personal needs.  Monitor the patient's symptoms If they are getting sicker, call his or her medical provider and tell them that the patient has, or is being evaluated for, COVID-19 infection. This will help the healthcare provider's  office take steps to keep other people from getting infected. Ask the healthcare provider to call the local or state health department.  Limit the number of people who have contact with the patient If possible, have only one caregiver for the patient. Other household members should stay in another home or place of residence. If this is not possible, they should stay in another room, or be separated from the patient as much as possible. Use a separate bathroom, if available. Restrict visitors who do not have an essential need to be in the home.  Keep older adults, very young children, and other sick people away from the patient Keep older adults, very young children, and those who have compromised immune systems or chronic health conditions away from the patient. This includes people with chronic heart, lung, or kidney conditions, diabetes, and cancer.  Ensure good ventilation Make sure that shared spaces in the home have good air flow, such as from an air conditioner or an opened window, weather permitting.  Wash your hands often Wash your hands often and thoroughly with soap and water for at least 20 seconds. You can use an alcohol based hand sanitizer if soap and water are not available and if your hands are not visibly dirty. Avoid touching your eyes, nose, and mouth with unwashed hands. Use disposable paper towels to dry your hands. If not available, use dedicated cloth towels and replace them when they become wet.  Wear a facemask and gloves Wear a disposable facemask at all times in the room and gloves when you touch or have contact with the patient's blood, body fluids, and/or secretions or excretions, such as sweat, saliva, sputum, nasal mucus, vomit, urine, or feces.  Ensure the mask fits over your nose and mouth tightly, and do not touch it during use. Throw out disposable facemasks and gloves after using them. Do not reuse. Wash your hands immediately after removing your facemask  and gloves. If your personal clothing becomes contaminated, carefully remove clothing and launder. Wash your hands after handling contaminated clothing. Place all used disposable facemasks, gloves, and other waste in a lined container before disposing them with other household waste. Remove gloves and wash your hands immediately after handling these items.  Do not share dishes, glasses, or  other household items with the patient Avoid sharing household items. You should not share dishes, drinking glasses, cups, eating utensils, towels, bedding, or other items with a patient who is confirmed to have, or being evaluated for, COVID-19 infection. After the person uses these items, you should wash them thoroughly with soap and water.  Wash laundry thoroughly Immediately remove and wash clothes or bedding that have blood, body fluids, and/or secretions or excretions, such as sweat, saliva, sputum, nasal mucus, vomit, urine, or feces, on them. Wear gloves when handling laundry from the patient. Read and follow directions on labels of laundry or clothing items and detergent. In general, wash and dry with the warmest temperatures recommended on the label.  Clean all areas the individual has used often Clean all touchable surfaces, such as counters, tabletops, doorknobs, bathroom fixtures, toilets, phones, keyboards, tablets, and bedside tables, every day. Also, clean any surfaces that may have blood, body fluids, and/or secretions or excretions on them. Wear gloves when cleaning surfaces the patient has come in contact with. Use a diluted bleach solution (e.g., dilute bleach with 1 part bleach and 10 parts water) or a household disinfectant with a label that says EPA-registered for coronaviruses. To make a bleach solution at home, add 1 tablespoon of bleach to 1 quart (4 cups) of water. For a larger supply, add  cup of bleach to 1 gallon (16 cups) of water. Read labels of cleaning products and follow  recommendations provided on product labels. Labels contain instructions for safe and effective use of the cleaning product including precautions you should take when applying the product, such as wearing gloves or eye protection and making sure you have good ventilation during use of the product. Remove gloves and wash hands immediately after cleaning.  Monitor yourself for signs and symptoms of illness Caregivers and household members are considered close contacts, should monitor their health, and will be asked to limit movement outside of the home to the extent possible. Follow the monitoring steps for close contacts listed on the symptom monitoring form.   ? If you have additional questions, contact your local health department or call the epidemiologist on call at [phone] (available 24/7). ? This guidance is subject to change. For the most up-to-date guidance from [facility], please refer to their website: YouBlogs.pl   Increase activity slowly   Complete by: As directed      Allergies as of 06/06/2020      Reactions   Ampicillin Hives   Contrast Media [iodinated Diagnostic Agents] Hives   Penicillin G Hives   Sulfamethoxazole-trimethoprim Hives   Tetracyclines & Related Hives   Verapamil Hives   Amlodipine Rash      Medication List    TAKE these medications   ferrous sulfate 325 (65 FE) MG EC tablet Take 1 tablet (325 mg total) by mouth 2 (two) times daily.   glimepiride 2 MG tablet Commonly known as: AMARYL Take 2 mg by mouth daily with breakfast.   levothyroxine 75 MCG tablet Commonly known as: SYNTHROID Take 75 mcg by mouth daily.   lisinopril 5 MG tablet Commonly known as: ZESTRIL Take 5 mg by mouth at bedtime.   lovastatin 20 MG tablet Commonly known as: MEVACOR Take 20 mg by mouth at bedtime.   MAGNESIUM OXIDE PO Take 400 mg by mouth daily.   metFORMIN 1000 MG tablet Commonly known as:  GLUCOPHAGE Take 1,000 mg by mouth 2 (two) times daily.   metoprolol succinate 25 MG 24 hr tablet Commonly known  as: Toprol XL Take 1 tablet (25 mg total) by mouth daily.   pantoprazole 40 MG tablet Commonly known as: PROTONIX Take 40 mg by mouth daily.   torsemide 20 MG tablet Commonly known as: DEMADEX Take 20mg  (one tablet) daily. You may take an extra tablet in the afternoon as needed for weight gain of 3 pounds overnight or 5 pounds in one week.   Tyler Aas FlexTouch 100 UNIT/ML FlexTouch Pen Generic drug: insulin degludec Inject 10-15 Units into the skin at bedtime.         Time coordinating discharge: 35 minutes  Signed:  Altha Sweitzer  [facility] 06/06/2020, 2:26 PM   Pager on [email or web address]. If 7PM-7AM, please contact night-coverage at [email or web address]

## 2020-06-06 NOTE — Consult Note (Addendum)
Pharmacy COVID-19 Monoclonal Antibody Screening  Tammie [name] was identified as being not hospitalized with symptoms from Covid-19 on admission but an incidental positive PCR has been documented.  The patient may qualify for the use of monoclonal antibodies (mAB) for COVID-19 viral infection to prevent worsening symptoms stemming from Covid-19 infection.  The patient was identified based on a positive COVID-19 PCR and not requiring the use of supplemental oxygen at this time.  This patient meets the FDA criteria for Emergency Use Authorization of casirivimab/imdevimab or bamlanivimab/etesevimab.  Has a (+) direct SARS-CoV-2 viral test result  Is NOT hospitalized due to COVID-19  Is within 10 days of symptom onset  Has at least one of the high risk factor(s) for progression to severe COVID-19 and/or hospitalization as defined in EUA.  Specific high risk criteria : Older age (>/= 80 yo), Diabetes and Cardiovascular disease or hypertension  Additionally: The patient  HAS NOT had a positive COVID-19 PCR in the last 90 days.  The patient is unvaccinated/not documented (pharmacist unable to reach nurse) in computer system against COVID-19.  The patient does meet criteria for mAB administration as she is not admitted into the hospital for COVID-19 pneumonia. Patient currently does not require oxygen. Per Dr. Mal Misty, the plan is for the patient to receive this medication and discharge.   This eligibility and indication for treatment was discussed with the patient's physician: Dr. Mal Misty  Plan: Based on the above discussion, it was decided that the patient will receive one dose of the available COVID-19 mAB combination. Pharmacy will coordinate administration timing with patient's nurse. Recommended infusion monitoring parameters communicated to the nursing team.   D/w MD via secure chat obtaining consent for medication.    Rowland Lathe 06/06/2020  10:20 AM

## 2020-06-06 NOTE — ED Notes (Signed)
Patient ambulated without any issues. Oxygen saturation remained 97-98% on room air. MD made aware.

## 2020-06-06 NOTE — ED Notes (Addendum)
Patient tolerated infusion and vitals stable.

## 2020-06-06 NOTE — ED Notes (Signed)
  Patient had PIV infiltrate in L wrist.  Site was swollen but not painful to patient.  PIV was removed and hand elevated to help with swelling.

## 2020-06-06 NOTE — Care Management CC44 (Addendum)
Condition Code 44 Documentation Completed  Patient Details  Name: Tammie [name] MRN: [id number] Date of Birth: Aug 11, 1940   Condition Code 44 given:  Yes Patient signature on Condition Code 44 notice:  Yes CSW spoke to patient over the telephone.  Verbal permission to sign x-as patient's signature.  Copy of letter given to pt.  Documentation of 2 MD's agreement:  Yes Code 44 added to claim:  Yes    [place], LCSW 06/06/2020, 3:10 PM

## 2020-06-06 NOTE — ED Notes (Signed)
Patient tolerating infusion. Vitals stable.

## 2020-06-06 NOTE — ED Notes (Signed)
Provider at bedside

## 2020-08-13 ENCOUNTER — Other Ambulatory Visit: Payer: Self-pay | Admitting: Family

## 2020-08-13 DIAGNOSIS — I493 Ventricular premature depolarization: Secondary | ICD-10-CM

## 2020-08-13 DIAGNOSIS — I10 Essential (primary) hypertension: Secondary | ICD-10-CM

## 2020-08-13 DIAGNOSIS — I5032 Chronic diastolic (congestive) heart failure: Secondary | ICD-10-CM

## 2020-11-10 ENCOUNTER — Other Ambulatory Visit: Payer: Self-pay | Admitting: Family

## 2020-11-10 DIAGNOSIS — I493 Ventricular premature depolarization: Secondary | ICD-10-CM

## 2020-11-10 DIAGNOSIS — I10 Essential (primary) hypertension: Secondary | ICD-10-CM

## 2020-11-10 DIAGNOSIS — I5032 Chronic diastolic (congestive) heart failure: Secondary | ICD-10-CM

## 2020-12-01 ENCOUNTER — Ambulatory Visit: Payer: Medicare HMO | Admitting: Internal Medicine

## 2020-12-01 ENCOUNTER — Other Ambulatory Visit: Payer: Self-pay

## 2020-12-01 ENCOUNTER — Encounter: Payer: Self-pay | Admitting: Nurse Practitioner

## 2020-12-01 VITALS — BP 120/70 | HR 77 | Ht 64.0 in | Wt 179.0 lb

## 2020-12-01 DIAGNOSIS — I5032 Chronic diastolic (congestive) heart failure: Secondary | ICD-10-CM | POA: Diagnosis not present

## 2020-12-01 DIAGNOSIS — E785 Hyperlipidemia, unspecified: Secondary | ICD-10-CM

## 2020-12-01 DIAGNOSIS — I493 Ventricular premature depolarization: Secondary | ICD-10-CM | POA: Diagnosis not present

## 2020-12-01 DIAGNOSIS — E1169 Type 2 diabetes mellitus with other specified complication: Secondary | ICD-10-CM

## 2020-12-01 DIAGNOSIS — I10 Essential (primary) hypertension: Secondary | ICD-10-CM

## 2020-12-01 DIAGNOSIS — N1831 Chronic kidney disease, stage 3a: Secondary | ICD-10-CM

## 2020-12-01 NOTE — Progress Notes (Signed)
Follow-up Outpatient Visit Date: 12/01/2020  Primary Care Provider: Grayland Ormond [facility], PA-C [address] [place] [address]  Chief Complaint: Follow-up HFpEF  HPI:  Tammie [name] is a 80 y.o. female with history of chronic HFpEF, hypertension, hyperlipidemia, type 2 diabetes mellitus, obesity, and GI bleed, who presents for follow-up of heart failure.  She was last seen in our office in 05/2020 by Laurann Montana, NP, at which time she was feeling relatively well without chest pain or dyspnea.  She has been making some dietary changes but reported continued limited exercise tolerance due to orthopedic issues.  No medication changes or additional testing were pursued.  She was subsequently seen in the ED and await 05/2020 due to syncope in the setting of COVID-19 infection and GI symptoms.  It was thought that dehydration in the setting of COVID-19 contributed to her episode.  Today, Tammie [name] reports that she is feeling relatively well.  Her mobility remains limited by low back and knee pain.  She has mild dependent edema from time to time.  She remains compliant with her torsemide and takes an additional dose once or twice a week.  She has not had any chest pain, shortness of breath, or palpitations.  She notes occasional lightheadedness associated with hypoglycemia.  --------------------------------------------------------------------------------------------------  Past Medical History:  Diagnosis Date   Cancer ([place])    skin   CHF (congestive heart failure) ([place])    Diabetes mellitus without complication ([place])    Dysrhythmia    Hypertension    Past Surgical History:  Procedure Laterality Date   ABDOMINAL HYSTERECTOMY     BREAST BIOPSY Left 2013   CORE W/CLIP - NEG   ESOPHAGOGASTRODUODENOSCOPY (EGD) WITH PROPOFOL N/A 12/23/2019   Procedure: ESOPHAGOGASTRODUODENOSCOPY (EGD) WITH PROPOFOL;  Surgeon: Jonathon Bellows, MD;  Location[facility] ENDOSCOPY;  Service:  Gastroenterology;  Laterality: N/A;    Current Meds  Medication Sig   ferrous sulfate 325 (65 FE) MG EC tablet Take 1 tablet (325 mg total) by mouth 2 (two) times daily.   glimepiride (AMARYL) 2 MG tablet Take 2 mg by mouth daily with breakfast.    insulin degludec (TRESIBA FLEXTOUCH) 100 UNIT/ML FlexTouch Pen Inject 10-15 Units into the skin at bedtime.   levothyroxine (SYNTHROID) 75 MCG tablet Take 75 mcg by mouth daily.   lisinopril (ZESTRIL) 5 MG tablet Take 5 mg by mouth at bedtime.   lovastatin (MEVACOR) 20 MG tablet Take 20 mg by mouth at bedtime.   MAGNESIUM OXIDE PO Take 400 mg by mouth daily.   metFORMIN (GLUCOPHAGE) 1000 MG tablet Take 1,000 mg by mouth 2 (two) times daily.   metoprolol succinate (TOPROL-XL) 25 MG 24 hr tablet TAKE 1 TABLET BY MOUTH EVERY DAY   pantoprazole (PROTONIX) 40 MG tablet Take 40 mg by mouth daily.   torsemide (DEMADEX) 20 MG tablet Take 20mg  (one tablet) daily. You may take an extra tablet in the afternoon as needed for weight gain of 3 pounds overnight or 5 pounds in one week.    Allergies: Ampicillin, Contrast media [iodinated diagnostic agents], Penicillin g, Sulfamethoxazole-trimethoprim, Tetracyclines & related, Verapamil, and Amlodipine  Social History   Tobacco Use   Smoking status: Never   Smokeless tobacco: Never  Substance Use Topics   Alcohol use: Not Currently   Drug use: Never    Family History  Problem Relation Age of Onset   Breast cancer Sister 51   Breast cancer Other     Review of Systems: A 12-system review  of systems was performed and was negative except as noted in the HPI.  --------------------------------------------------------------------------------------------------  Physical Exam: BP 120/70 (BP Location: Left Arm, Patient Position: Sitting, Cuff Size: Large)   Pulse 77   Ht 5\' 4"  (1.626 m)   Wt 179 lb (81.2 kg)   SpO2 95%   BMI 30.73 kg/m   General:  NAD. Neck: No JVD or HJR. Lungs: Clear to  auscultation bilaterally without wheezes or crackles. Heart: Regular rate and rhythm without murmurs, rubs, or gallops. Abdomen: Soft, nontender, nondistended. Extremities: 1+ bilateral ankle edema.  EKG: Normal sinus rhythm without abnormality.  Lab Results  Component Value Date   WBC 6.0 06/05/2020   HGB 11.7 (L) 06/05/2020   HCT 38.7 06/05/2020   MCV 85.1 06/05/2020   PLT 222 06/05/2020    Lab Results  Component Value Date   NA 140 06/06/2020   K 4.2 06/06/2020   CL 100 06/06/2020   CO2 27 06/06/2020   BUN 23 06/06/2020   CREATININE 1.09 (H) 06/06/2020   GLUCOSE 124 (H) 06/06/2020   ALT 20 06/06/2020    --------------------------------------------------------------------------------------------------  ASSESSMENT AND PLAN: Chronic HFpEF: Overall, Tammie [name] feels well with NYHA class I/II symptoms (functional capacity primarily limited by orthopedic problems).  She has mild leg edema, which she attributes to her legs hanging off the exam table.  We will continue her current regimen of torsemide 20 mg daily, with an additional as needed dose for weight gain/swelling.  PVCs: No palpitations reported.  EKG without PVCs.  We will continue with low-dose metoprolol.  Hypertension: Blood pressure well controlled.  No medication changes at this time.  Chronic kidney disease stage III: Creatinine stable at 1.1 (GFR 48) on last check through [facility] system in March.  Continue current medications with avoidance of nephrotoxic agents.  Hyperlipidemia associated with type 2 diabetes mellitus: Most recent lipid panel in 04/2020 through [facility] notable for excellent LDL at 40 and triglycerides of 110.  We will continue lovastatin 20 mg nightly.  Ongoing management of diabetes per Mr. Whitaker.  Follow-up: Return to clinic in 6 months.  Nelva Bush, MD 12/01/2020 2:57 PM

## 2020-12-01 NOTE — Patient Instructions (Signed)
  Medication Instructions: Your physician recommends that you continue on your current medications as directed. Please refer to the Current Medication list given to you today.  *If you need a refill on your cardiac medications before your next appointment, please call your pharmacy*   Lab Work:  No LABS   If you have labs (blood work) drawn today and your tests are completely normal, you will receive your results only by: [place] (if you have MyChart) OR A paper copy in the mail If you have any lab test that is abnormal or we need to change your treatment, we will call you to review the results.   Testing/Procedures: NONE    Follow-Up: At [facility], you and your health needs are our priority.  As part of our continuing mission to provide you with exceptional heart care, we have created designated Provider Care Teams.  These Care Teams include your primary Cardiologist (physician) and Advanced Practice Providers (APPs -  Physician Assistants and Nurse Practitioners) who all work together to provide you with the care you need, when you need it.  We recommend signing up for the patient portal called "MyChart".  Sign up information is provided on this After Visit Summary.  MyChart is used to connect with patients for Virtual Visits (Telemedicine).  Patients are able to view lab/test results, encounter notes, upcoming appointments, etc.  Non-urgent messages can be sent to your provider as well.   To learn more about what you can do with MyChart, go to [email or web address].    Your next appointment:   6 month(s)  The format for your next appointment:   In Person  Provider:   You may see Nelva Bush, MD or one of the following Advanced Practice Providers on your designated Care Team:   Murray Hodgkins, NP Christell Faith, PA-C Marrianne Mood, PA-C Cadence Calpine, [place] Laurann Montana, NP

## 2020-12-02 ENCOUNTER — Ambulatory Visit: Payer: Medicare HMO | Admitting: Internal Medicine

## 2020-12-02 ENCOUNTER — Encounter: Payer: Self-pay | Admitting: Internal Medicine

## 2020-12-02 DIAGNOSIS — I493 Ventricular premature depolarization: Secondary | ICD-10-CM | POA: Insufficient documentation

## 2020-12-02 DIAGNOSIS — E785 Hyperlipidemia, unspecified: Secondary | ICD-10-CM | POA: Insufficient documentation

## 2020-12-02 DIAGNOSIS — N1831 Chronic kidney disease, stage 3a: Secondary | ICD-10-CM | POA: Insufficient documentation

## 2020-12-02 DIAGNOSIS — E1169 Type 2 diabetes mellitus with other specified complication: Secondary | ICD-10-CM | POA: Insufficient documentation

## 2021-02-02 ENCOUNTER — Other Ambulatory Visit: Payer: Self-pay | Admitting: Family

## 2021-02-02 DIAGNOSIS — I493 Ventricular premature depolarization: Secondary | ICD-10-CM

## 2021-02-02 DIAGNOSIS — I10 Essential (primary) hypertension: Secondary | ICD-10-CM

## 2021-02-02 DIAGNOSIS — I5032 Chronic diastolic (congestive) heart failure: Secondary | ICD-10-CM

## 2021-02-18 ENCOUNTER — Other Ambulatory Visit: Payer: Self-pay | Admitting: Family Medicine

## 2021-02-18 DIAGNOSIS — R918 Other nonspecific abnormal finding of lung field: Secondary | ICD-10-CM

## 2021-03-15 ENCOUNTER — Other Ambulatory Visit: Payer: Self-pay

## 2021-03-15 ENCOUNTER — Ambulatory Visit
Admission: RE | Admit: 2021-03-15 | Discharge: 2021-03-15 | Disposition: A | Discharge status: Home / Self Care | Payer: Medicare HMO | Source: Ambulatory Visit | Attending: Family Medicine | Admitting: Family Medicine

## 2021-03-15 ENCOUNTER — Other Ambulatory Visit: Payer: Medicare HMO

## 2021-03-15 DIAGNOSIS — R918 Other nonspecific abnormal finding of lung field: Secondary | ICD-10-CM | POA: Diagnosis present

## 2021-06-22 ENCOUNTER — Other Ambulatory Visit: Payer: Self-pay

## 2021-06-22 ENCOUNTER — Encounter: Payer: Self-pay | Admitting: Internal Medicine

## 2021-06-22 ENCOUNTER — Ambulatory Visit: Payer: Medicare HMO | Admitting: Internal Medicine

## 2021-06-22 VITALS — BP 144/80 | HR 75 | Ht 64.0 in | Wt 181.0 lb

## 2021-06-22 DIAGNOSIS — E1169 Type 2 diabetes mellitus with other specified complication: Secondary | ICD-10-CM

## 2021-06-22 DIAGNOSIS — E1159 Type 2 diabetes mellitus with other circulatory complications: Secondary | ICD-10-CM

## 2021-06-22 DIAGNOSIS — I152 Hypertension secondary to endocrine disorders: Secondary | ICD-10-CM | POA: Diagnosis not present

## 2021-06-22 DIAGNOSIS — I5032 Chronic diastolic (congestive) heart failure: Secondary | ICD-10-CM

## 2021-06-22 DIAGNOSIS — Z79899 Other long term (current) drug therapy: Secondary | ICD-10-CM

## 2021-06-22 DIAGNOSIS — N1831 Chronic kidney disease, stage 3a: Secondary | ICD-10-CM

## 2021-06-22 DIAGNOSIS — I10 Essential (primary) hypertension: Secondary | ICD-10-CM | POA: Diagnosis not present

## 2021-06-22 DIAGNOSIS — E785 Hyperlipidemia, unspecified: Secondary | ICD-10-CM

## 2021-06-22 MED ORDER — LISINOPRIL 10 MG PO TABS: 10.0000 mg | ORAL_TABLET | Freq: Every day | ORAL | 1 refills | Status: DC

## 2021-06-22 MED ORDER — LISINOPRIL 10 MG PO TABS: 10.0000 mg | ORAL_TABLET | Freq: Every day | ORAL | 2 refills | Status: DC

## 2021-06-22 NOTE — Progress Notes (Signed)
Follow-up Outpatient Visit Date: 06/22/2021  Primary Care Provider: Grayland Ormond [facility], PA-C [address] [place] [address]  Chief Complaint: Follow-up heart failure and hypertension  HPI:  Tammie [name] is a 81 y.o. female with history of chronic HFpEF, hypertension, hyperlipidemia, type 2 diabetes mellitus, obesity, and GI bleed, who presents for follow-up of heart failure.  I last saw her in June, at which time she was feeling relatively well.  Mobility remains limited due to low back and knee pain.  No further testing or intervention was pursued.  Today, Tammie [name] reports feeling well from a heart standpoint.  She denies chest pain, shortness of breath, orthopnea, and edema.  Activity remains limited due to back and neck pain.  She walks about 100 yards to her mailbox once or twice a day without limitations.  She has not had to take any additional torsemide beyond her daily 20 mg dose.  She recently acquired a new automated blood pressure cuff and reports that her blood pressures have been running a bit high since Christmas.  She tries to limit her sodium intake.  --------------------------------------------------------------------------------------------------  Past Medical History:  Diagnosis Date   Cancer ([place])    skin   CHF (congestive heart failure) ([place])    Diabetes mellitus without complication ([place])    Dysrhythmia    Hypertension    Past Surgical History:  Procedure Laterality Date   ABDOMINAL HYSTERECTOMY     BREAST BIOPSY Left 2013   CORE W/CLIP - NEG   ESOPHAGOGASTRODUODENOSCOPY (EGD) WITH PROPOFOL N/A 12/23/2019   Procedure: ESOPHAGOGASTRODUODENOSCOPY (EGD) WITH PROPOFOL;  Surgeon: Jonathon Bellows, MD;  Location: [facility] ENDOSCOPY;  Service: Gastroenterology;  Laterality: N/A;    Current Meds  Medication Sig   ferrous sulfate 325 (65 FE) MG tablet Take 325 mg by mouth daily with breakfast.   glimepiride (AMARYL) 2 MG tablet Take 2 mg by mouth  daily with breakfast.    Insulin Degludec (TRESIBA) 100 UNIT/ML SOLN Inject 10 Units into the skin 2 (two) times daily.   levothyroxine (SYNTHROID) 75 MCG tablet Take 75 mcg by mouth daily.   lovastatin (MEVACOR) 20 MG tablet Take 20 mg by mouth at bedtime.   MAGNESIUM OXIDE PO Take 400 mg by mouth daily.   metFORMIN (GLUCOPHAGE) 1000 MG tablet Take 1,000 mg by mouth 2 (two) times daily.   metoprolol succinate (TOPROL-XL) 25 MG 24 hr tablet TAKE 1 TABLET BY MOUTH EVERY DAY   pantoprazole (PROTONIX) 40 MG tablet Take 40 mg by mouth daily.   torsemide (DEMADEX) 20 MG tablet Take 49m (one tablet) daily. You may take an extra tablet in the afternoon as needed for weight gain of 3 pounds overnight or 5 pounds in one week.    Allergies: Ampicillin, Contrast media [iodinated contrast media], Penicillin g, Sulfamethoxazole-trimethoprim, Tetracyclines & related, Verapamil, and Amlodipine  Social History   Tobacco Use   Smoking status: Never   Smokeless tobacco: Never  Vaping Use   Vaping Use: Never used  Substance Use Topics   Alcohol use: Not Currently   Drug use: Never    Family History  Problem Relation Age of Onset   Breast cancer Sister 713  Breast cancer Other     Review of Systems: A 12-system review of systems was performed and was negative except as noted in the HPI.  --------------------------------------------------------------------------------------------------  Physical Exam: BP (!) 144/80 (BP Location: Left Arm, Patient Position: Sitting, Cuff Size: Large)    Pulse 75  Ht '5\' 4"'  (1.626 m)    Wt 181 lb (82.1 kg)    SpO2 98%    BMI 31.07 kg/m  Repeat BP 150/78  General:  NAD.  She is accompanied by her daughter-in-law. Neck: No JVD or HJR. Lungs: Clear to auscultation bilaterally without wheezes or crackles. Heart: Regular rate and rhythm without murmurs, rubs, or gallops. Abdomen: Soft, nontender, nondistended. Extremities: Trace pretibial edema  bilaterally.  EKG: Normal sinus rhythm without abnormality.  Lab Results  Component Value Date   WBC 6.0 06/05/2020   HGB 11.7 (L) 06/05/2020   HCT 38.7 06/05/2020   MCV 85.1 06/05/2020   PLT 222 06/05/2020    Lab Results  Component Value Date   NA 140 06/06/2020   K 4.2 06/06/2020   CL 100 06/06/2020   CO2 27 06/06/2020   BUN 23 06/06/2020   CREATININE 1.09 (H) 06/06/2020   GLUCOSE 124 (H) 06/06/2020   ALT 20 06/06/2020    No results found for: CHOL, HDL, LDLCALC, LDLDIRECT, TRIG, CHOLHDL  --------------------------------------------------------------------------------------------------  ASSESSMENT AND PLAN: Chronic HFpEF: Tammie [name] has trace pretibial edema but otherwise appears euvolemic.  Functional capacity is largely limited by her back and neck pain though she does not have any significant exertional dyspnea.  Blood pressure is suboptimally controlled today; we will increase lisinopril to 10 mg daily.  Continue current doses of metoprolol and torsemide.  We will check a BMP today and again in 2 weeks.  Hypertension associated with type 2 diabetes mellitus: Blood pressure suboptimally controlled today and also at home over the last few weeks.  Importance of sodium restriction was reinforced.  We will increase lisinopril from 5 mg daily to 10 mg daily, with a BMP today and again in 2 weeks.  I have encouraged Tammie [name] to continue monitoring her blood pressure at home and to alert [place] if it is consistently above 130/80.  Further medication adjustment may also need to be pursued when she follows up with her PCP in March.  Chronic kidney disease stage IIIa: Most recent labs in 02/2021 through [facility] showed stable renal function with EGFR 48.  In the setting of escalating lisinopril, we will check a BMP today and again in 2 weeks.  Continue torsemide, as above.  Hyperlipidemia associated with type 2 diabetes mellitus: Ongoing management per  PCP.  Follow-up: Return to clinic in 6 months.  Nelva Bush, MD 06/22/2021 9:27 AM

## 2021-06-22 NOTE — Patient Instructions (Signed)
Medication Instructions:   Your physician has recommended you make the following change in your medication:   INCREASE Lisinopril 10 mg daily - You may take TWO tablets of current dose (5 mg) until you pick up new Rx  *If you need a refill on your cardiac medications before your next appointment, please call your pharmacy*   Lab Work:  1) BMET today  2) Your physician recommends that you return for lab work in: [place] (BMET)      - This lab is not fasting  If you have labs (blood work) drawn today and your tests are completely normal, you will receive your results only by: [place] (if you have [place]) OR A paper copy in the mail If you have any lab test that is abnormal or we need to change your treatment, we will call you to review the results.   Testing/Procedures:  None ordered   Follow-Up: At [facility], you and your health needs are our priority.  As part of our continuing mission to provide you with exceptional heart care, we have created designated Provider Care Teams.  These Care Teams include your primary Cardiologist (physician) and Advanced Practice Providers (APPs -  Physician Assistants and Nurse Practitioners) who all work together to provide you with the care you need, when you need it.  We recommend signing up for the patient portal called "MyChart".  Sign up information is provided on this After Visit Summary.  MyChart is used to connect with patients for Virtual Visits (Telemedicine).  Patients are able to view lab/test results, encounter notes, upcoming appointments, etc.  Non-urgent messages can be sent to your provider as well.   To learn more about what you can do with MyChart, go to [email or web address].    Your next appointment:   6 month(s)  The format for your next appointment:   In Person  Provider:   You may see Nelva Bush, MD or one of the following Advanced Practice Providers on your designated Care Team:   Murray Hodgkins, NP Christell Faith, PA-C Cadence Kathlen Mody, [place]

## 2021-06-23 LAB — BASIC METABOLIC PANEL
BUN/Creatinine Ratio: 18 (ref 12–28)
BUN: 20 mg/dL (ref 8–27)
CO2: 25 mmol/L (ref 20–29)
Calcium: 9.9 mg/dL (ref 8.7–10.3)
Chloride: 95 mmol/L — ABNORMAL LOW (ref 96–106)
Creatinine, Ser: 1.1 mg/dL — ABNORMAL HIGH (ref 0.57–1.00)
Glucose: 146 mg/dL — ABNORMAL HIGH (ref 70–99)
Potassium: 4.9 mmol/L (ref 3.5–5.2)
Sodium: 141 mmol/L (ref 134–144)
eGFR: 50 mL/min/{1.73_m2} — ABNORMAL LOW (ref >59)

## 2021-07-06 ENCOUNTER — Other Ambulatory Visit: Payer: Self-pay

## 2021-07-06 ENCOUNTER — Other Ambulatory Visit (INDEPENDENT_AMBULATORY_CARE_PROVIDER_SITE_OTHER): Payer: Medicare HMO

## 2021-07-06 DIAGNOSIS — I152 Hypertension secondary to endocrine disorders: Secondary | ICD-10-CM | POA: Diagnosis not present

## 2021-07-06 DIAGNOSIS — E1159 Type 2 diabetes mellitus with other circulatory complications: Secondary | ICD-10-CM | POA: Diagnosis not present

## 2021-07-06 DIAGNOSIS — I5032 Chronic diastolic (congestive) heart failure: Secondary | ICD-10-CM

## 2021-07-06 DIAGNOSIS — Z79899 Other long term (current) drug therapy: Secondary | ICD-10-CM

## 2021-07-07 LAB — BASIC METABOLIC PANEL
BUN/Creatinine Ratio: 21 (ref 12–28)
BUN: 24 mg/dL (ref 8–27)
CO2: 28 mmol/L (ref 20–29)
Calcium: 9.7 mg/dL (ref 8.7–10.3)
Chloride: 98 mmol/L (ref 96–106)
Creatinine, Ser: 1.16 mg/dL — ABNORMAL HIGH (ref 0.57–1.00)
Glucose: 133 mg/dL — ABNORMAL HIGH (ref 70–99)
Potassium: 4.1 mmol/L (ref 3.5–5.2)
Sodium: 142 mmol/L (ref 134–144)
eGFR: 47 mL/min/{1.73_m2} — ABNORMAL LOW (ref >59)

## 2021-09-01 IMAGING — NM NM PULMONARY PERF PARTICULATE
1 series · 8 of 8 positions shown · non-contrast
Comparison: Same day chest radiograph

CLINICAL DATA: Bilateral leg edema

EXAM:
NUCLEAR MEDICINE PERFUSION LUNG SCAN
TECHNIQUE: Perfusion images were obtained in multiple projections after
intravenous injection of radiopharmaceutical.
Ventilation scans intentionally deferred if perfusion scan and chest
x-ray adequate for interpretation during COVID 19 epidemic.
RADIOPHARMACEUTICALS:  4.05 mCi Ic-UUm MAA IV

[Series 1000: lung perfusion · 1.65mm/px · 4 acquisitions, 8 frames shown]
[im 1/4]
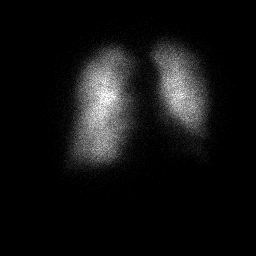
[im 1/4]
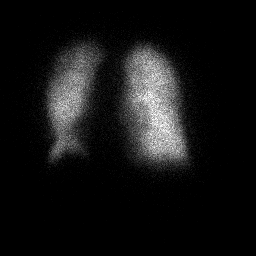
[im 2/4]
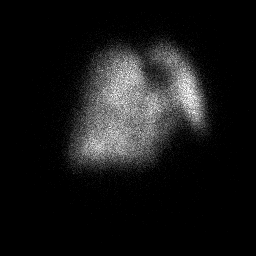
[im 2/4]
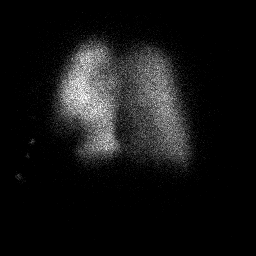
[im 3/4  full-range]
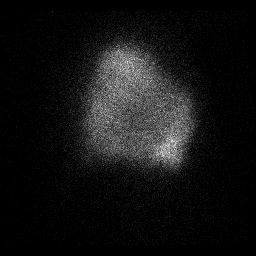
[im 3/4  full-range]
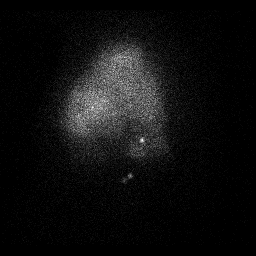
[im 4/4]
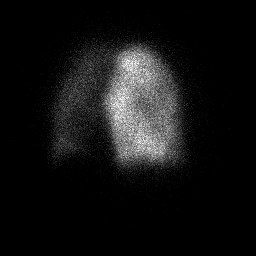
[im 4/4]
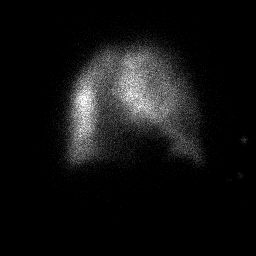

[8 of 8 positions shown; findings below may reference images not displayed]

FINDINGS: Radiographically matched, nonsegmental defect of the left lung base,
in keeping with pleural effusion seen on comparison chest
radiograph. No evidence of segmental, wedge-shaped, or other
suspicious perfusion defects.
IMPRESSION: Radiographically matched, nonsegmental defect of the left lung base,
in keeping with pleural effusion seen on comparison chest
radiograph. Low probability examination for pulmonary embolism by
modified perfusion only PIOPED criteria.

## 2021-09-01 IMAGING — US US EXTREM LOW VENOUS
1 series · 13 of 24 positions shown · non-contrast
Comparison: None.

CLINICAL DATA: Bilateral lower extremity edema



[Series 1: us venous img lower bilat (dvt) · portal-venous · 13 of 52 slices shown]
[im 1/52]
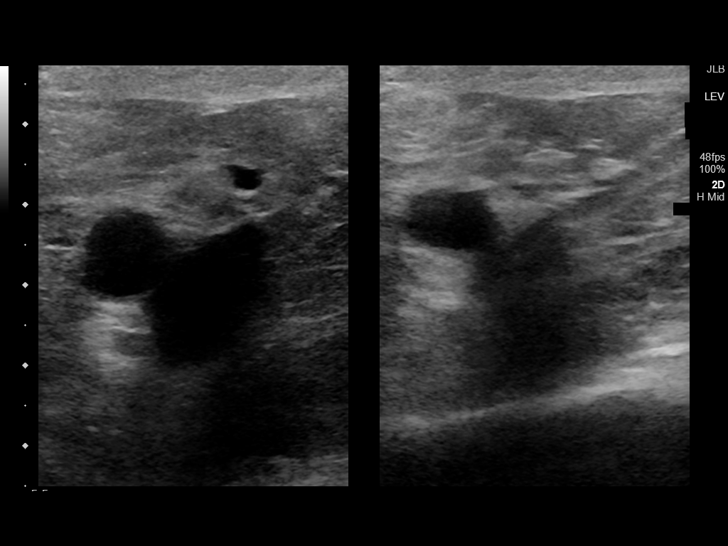
[im 5/52]
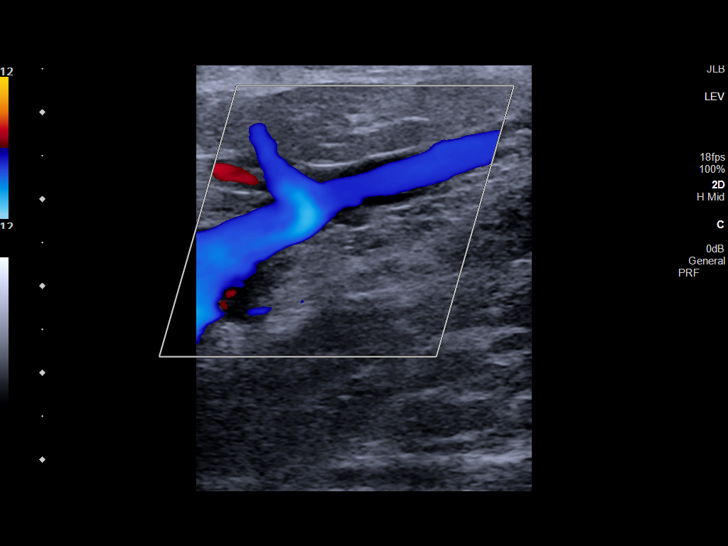
[im 9/52]
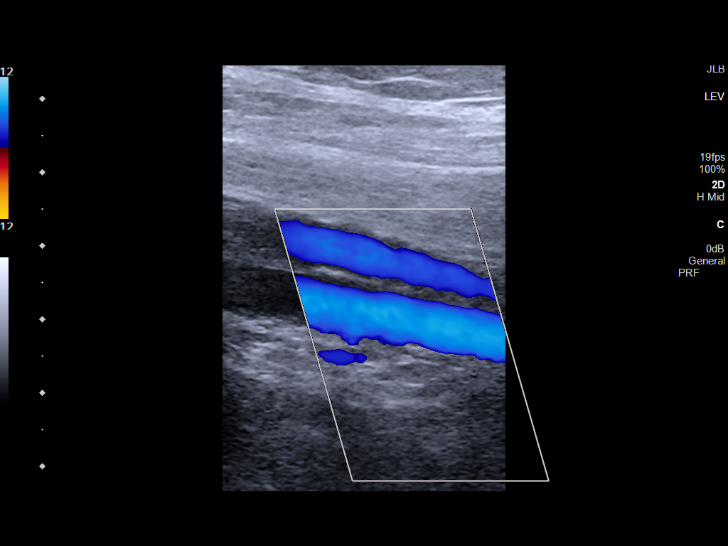
[im 14/52]
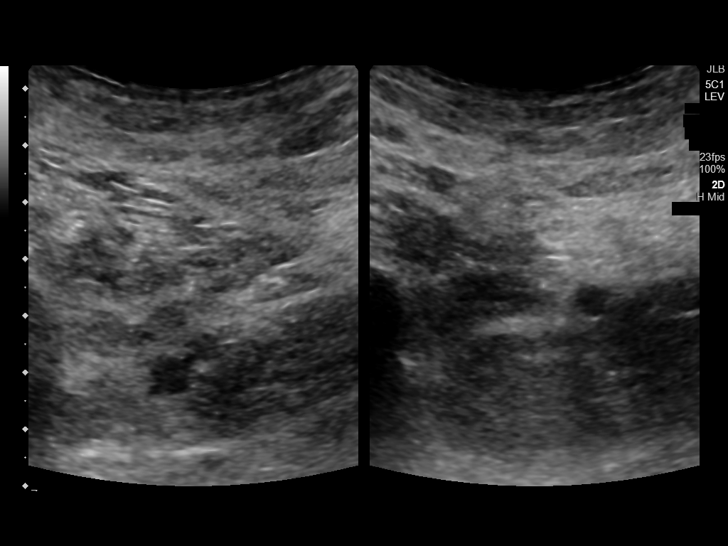
[im 18/52]
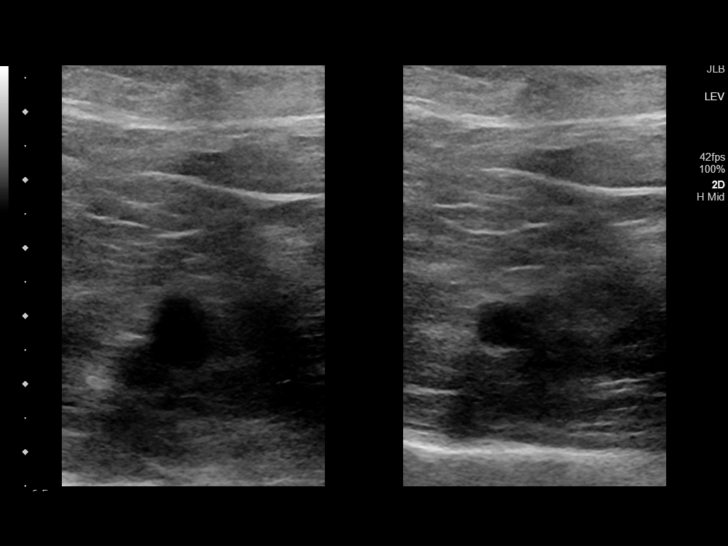
[im 23/52]
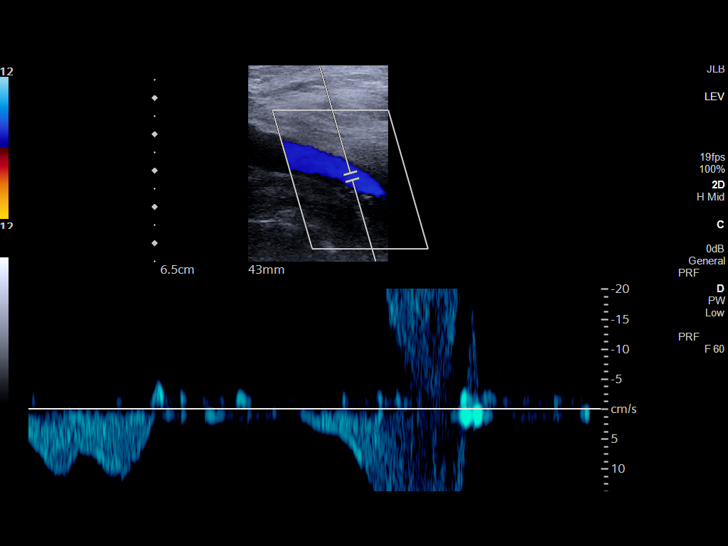
[im 27/52]
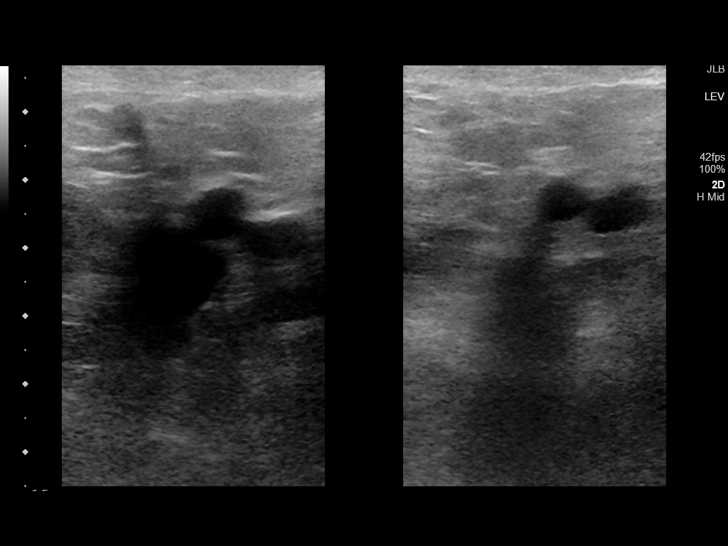
[im 29/52]
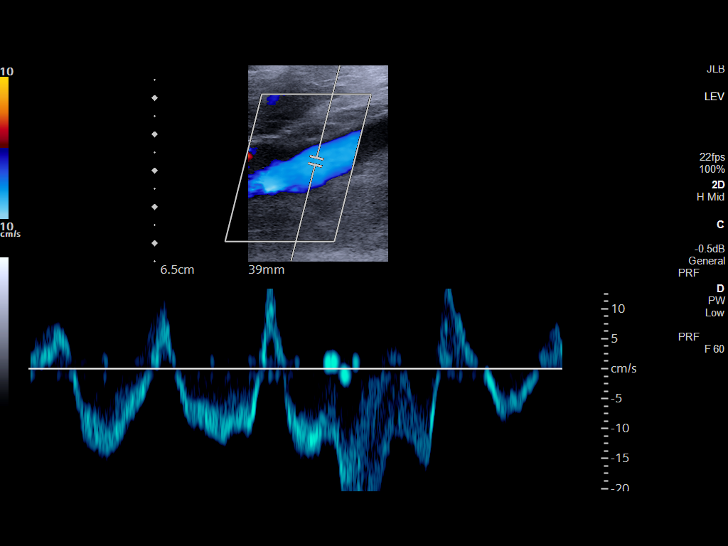
[im 34/52]
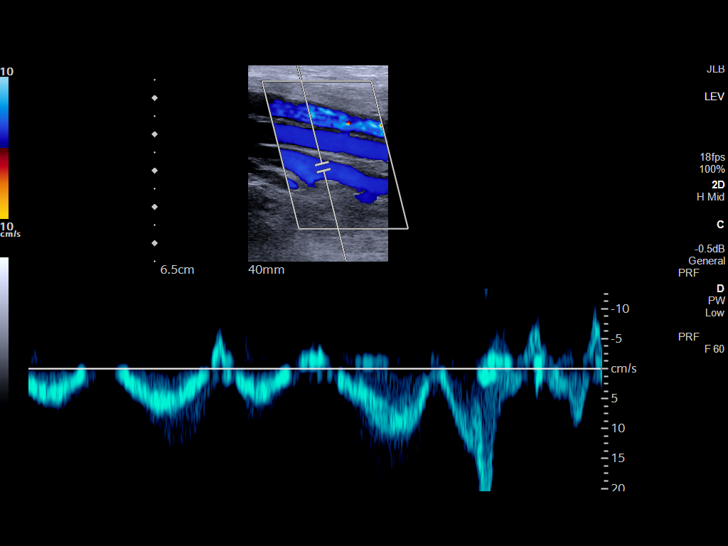
[im 38/52]
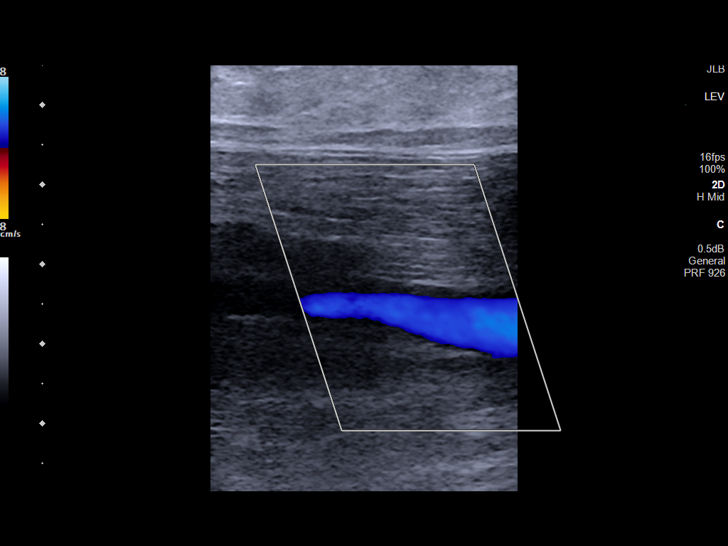
[im 43/52]
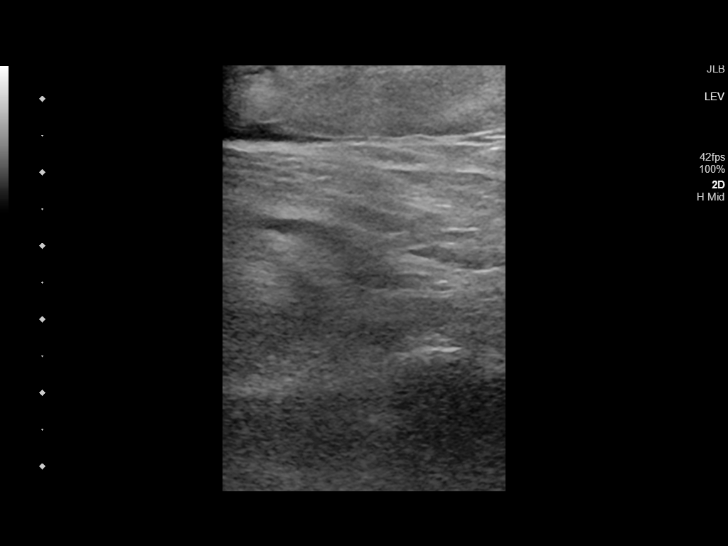
[im 47/52]
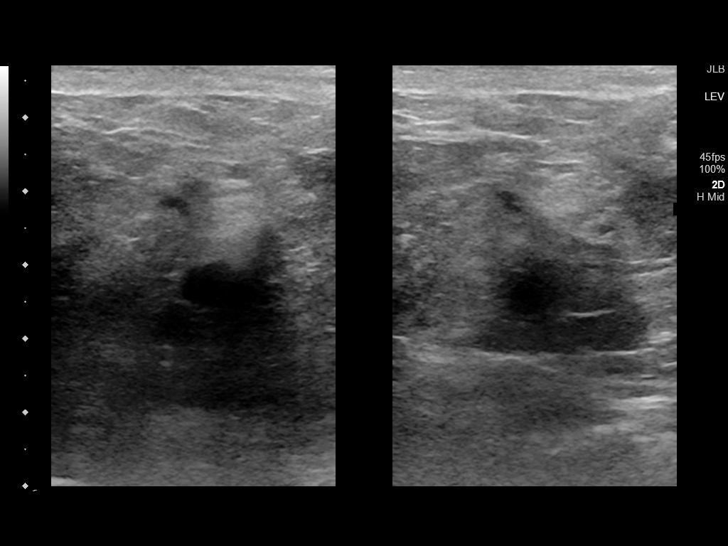
[im 52/52]
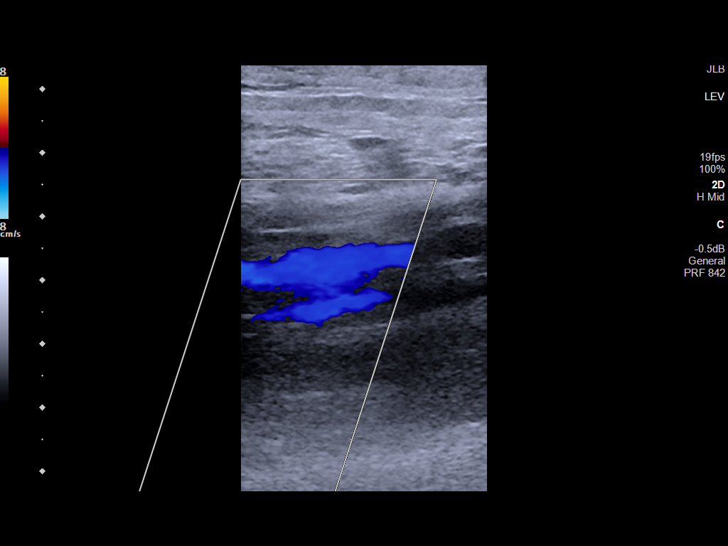

[13 of 24 positions shown; findings below may reference images not displayed]

FINDINGS: RIGHT LOWER EXTREMITY

Common Femoral Vein: No evidence of thrombus. Normal
compressibility, respiratory phasicity and response to augmentation.

Saphenofemoral Junction: No evidence of thrombus. Normal
compressibility and flow on color Doppler imaging.

Profunda Femoral Vein: No evidence of thrombus. Normal
compressibility and flow on color Doppler imaging.

Femoral Vein: No evidence of thrombus. Normal compressibility,
respiratory phasicity and response to augmentation.

Popliteal Vein: No evidence of thrombus. Normal compressibility,
respiratory phasicity and response to augmentation.

Calf Veins: No evidence of thrombus. Normal compressibility and flow
on color Doppler imaging.

Superficial Great Saphenous Vein: No evidence of thrombus. Normal
compressibility.

Venous Reflux:  None.

Other Findings:  None.

LEFT LOWER EXTREMITY

Common Femoral Vein: No evidence of thrombus. Normal
compressibility, respiratory phasicity and response to augmentation.

Saphenofemoral Junction: No evidence of thrombus. Normal
compressibility and flow on color Doppler imaging.

Profunda Femoral Vein: No evidence of thrombus. Normal
compressibility and flow on color Doppler imaging.

Femoral Vein: No evidence of thrombus. Normal compressibility,
respiratory phasicity and response to augmentation.

Popliteal Vein: No evidence of thrombus. Normal compressibility,
respiratory phasicity and response to augmentation.

Calf Veins: No evidence of thrombus. Normal compressibility and flow
on color Doppler imaging.

Superficial Great Saphenous Vein: No evidence of thrombus. Normal
compressibility.

Venous Reflux:  None.

Other Findings:  None.
IMPRESSION: No evidence of deep venous thrombosis in either lower extremity.

## 2021-09-13 IMAGING — CT CT CHEST W/O CM
2 of 3 series · 15 of 36 positions shown, 18 images · non-contrast
Comparison: December 08, 2019

CLINICAL DATA: Shortness of breath.

EXAM:
CT CHEST WITHOUT CONTRAST
TECHNIQUE: Multidetector CT imaging of the chest was performed following the
standard protocol without IV contrast.

[Series 3: thorax · axial · 0.76mm/px · z∈[-199,+57]mm · 12 of 152 slices shown, 15 images]
[im 12/152  mediastinal]
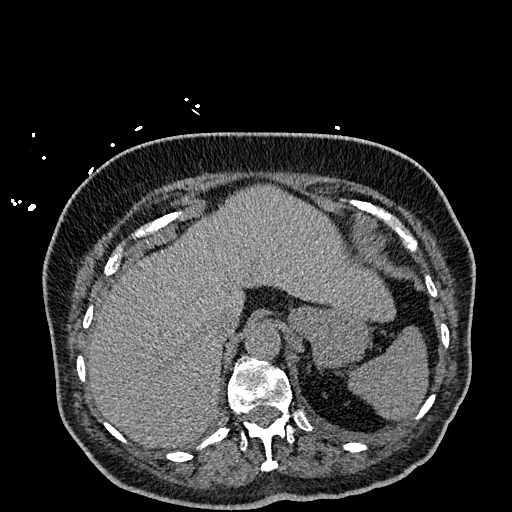
[im 12/152  lung]
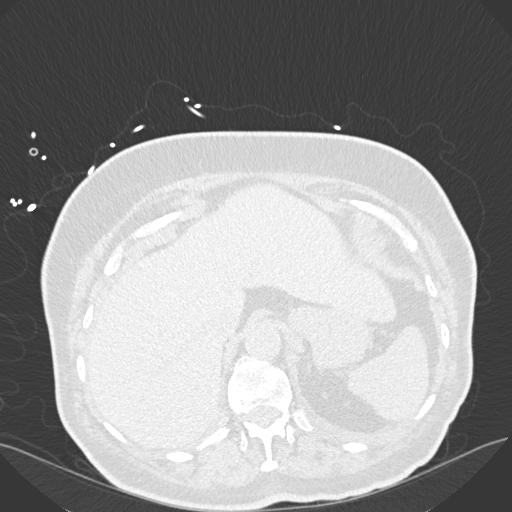
[im 23/152  lung]
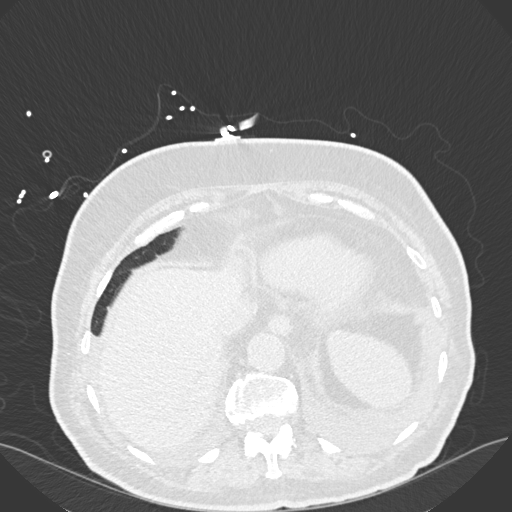
[im 34/152  lung]
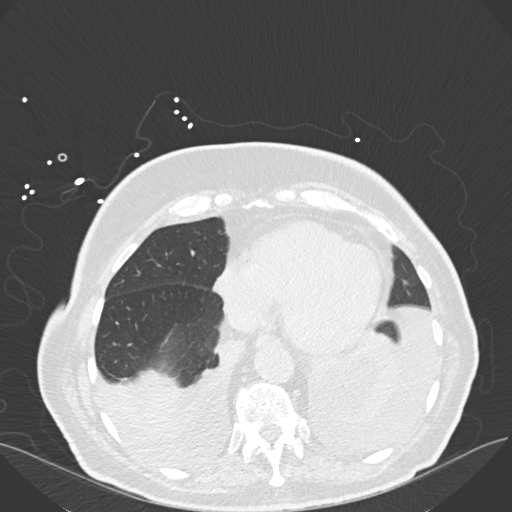
[im 45/152  lung]
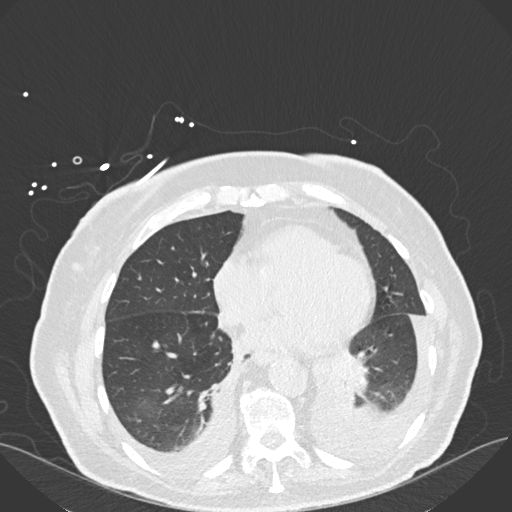
[im 56/152  mediastinal]
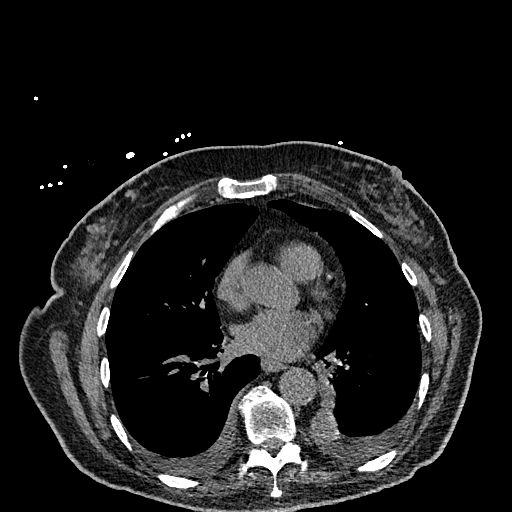
[im 56/152  lung]
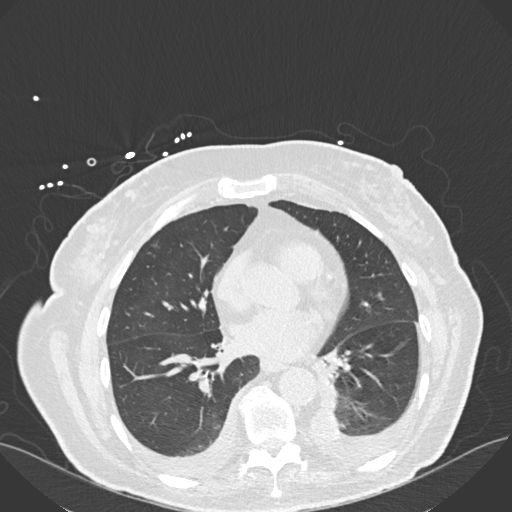
[im 68/152  lung]
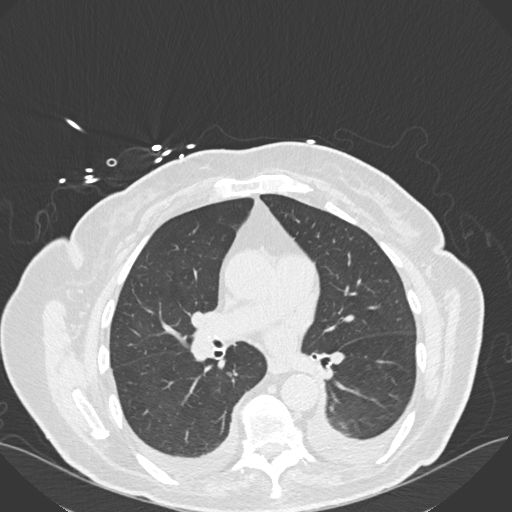
[im 84/152  lung]
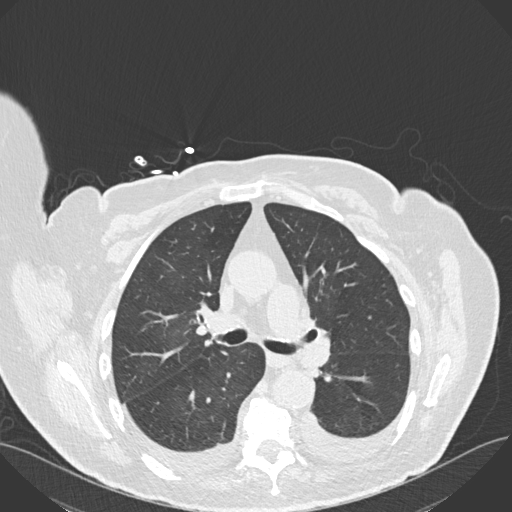
[im 96/152  lung]
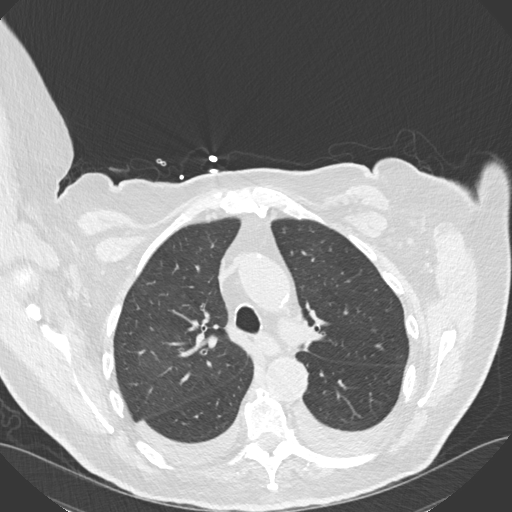
[im 107/152  mediastinal]
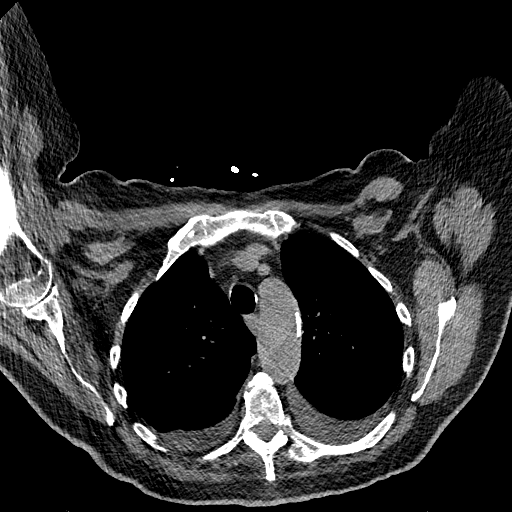
[im 107/152  lung]
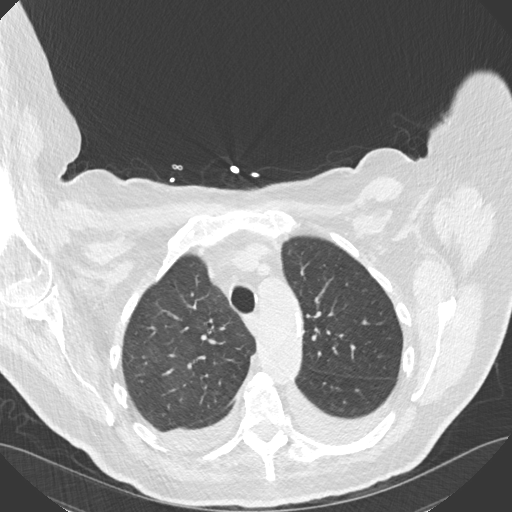
[im 118/152  lung]
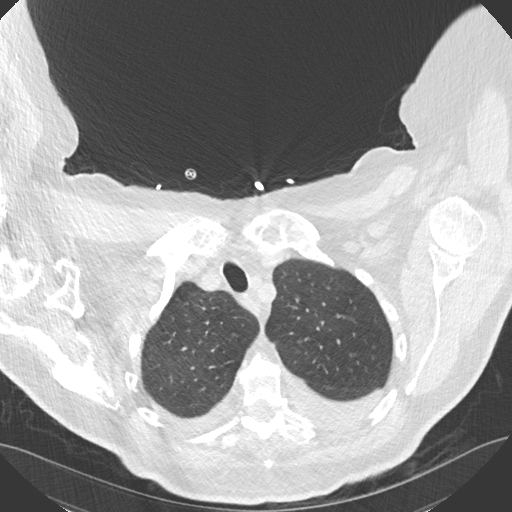
[im 129/152  lung]
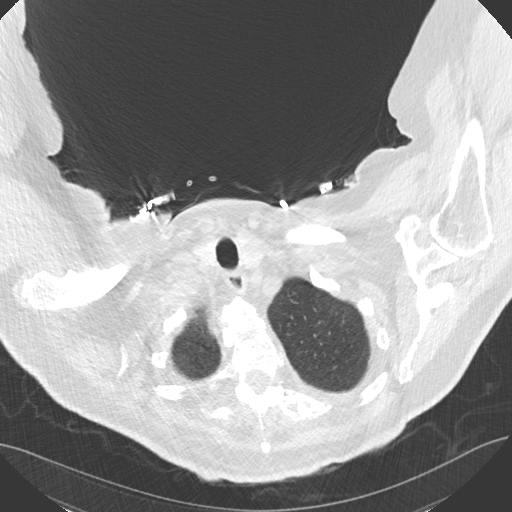
[im 140/152  lung]
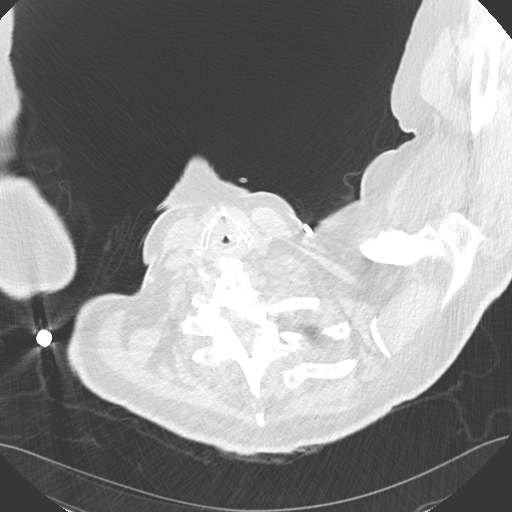

[Series 6: coronal · coronal · 0.62mm/px · 3 of 153 slices shown]
[im 31/153  lung]
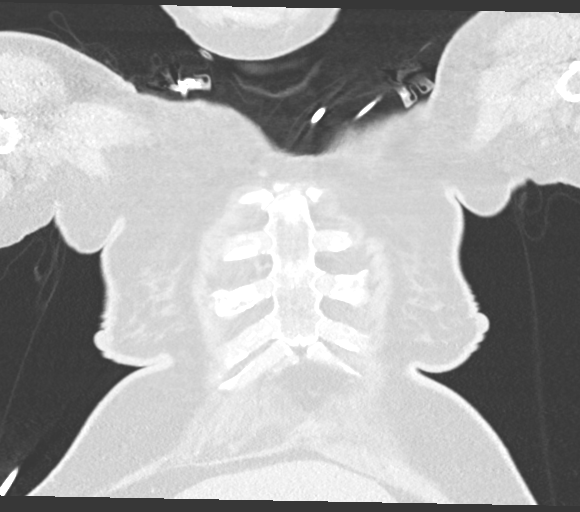
[im 61/153  lung]
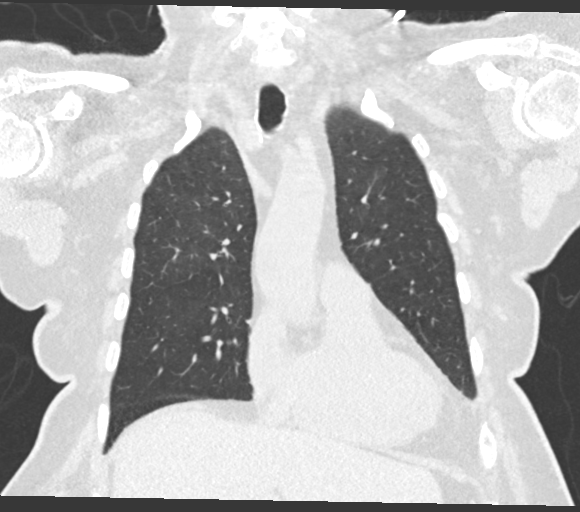
[im 92/153  lung]
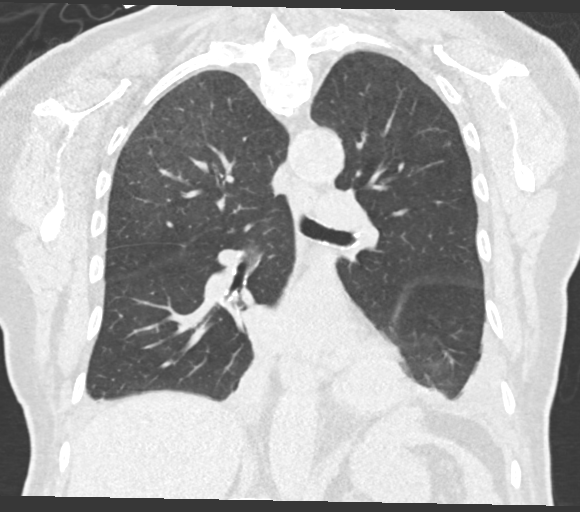

[15 of 36 positions shown; findings below may reference images not displayed]

FINDINGS: Cardiovascular: There is moderate severity calcification of the
aortic arch. Normal heart size. No pericardial effusion.

Mediastinum/Nodes: No enlarged mediastinal or axillary lymph nodes.
Thyroid gland, trachea, and esophagus demonstrate no significant
findings.

Lungs/Pleura: A 2 mm noncalcified lung nodule is seen within the
right apex.

Stable moderate severity areas of atelectasis and/or infiltrate are
seen within the bilateral lung bases.

Small bilateral pleural effusions are noted. These are mildly
decreased in size when compared to the prior exam.

No pneumothorax is identified.

Upper Abdomen: No acute abnormality.

Musculoskeletal: Moderate to marked severity multilevel degenerative
changes seen throughout the thoracic spine. A chronic compression
deformity of the T9 vertebral body is again noted.
IMPRESSION: 1. Stable moderate severity bibasilar atelectasis and/or infiltrate.
2. Small bilateral pleural effusions, mildly decreased in size when
compared to the prior exam.
3. 2 mm noncalcified lung nodule within the right apex. Correlation
with 12 month follow-up chest CT is recommended to determine
stability.
4. Chronic compression deformity of the T9 vertebral body.
5. Aortic atherosclerosis.

Aortic Atherosclerosis ([id number].T).

## 2021-12-27 ENCOUNTER — Ambulatory Visit: Payer: Medicare HMO | Admitting: Nurse Practitioner

## 2021-12-27 ENCOUNTER — Encounter: Payer: Self-pay | Admitting: Nurse Practitioner

## 2021-12-27 VITALS — BP 136/68 | HR 71 | Ht 64.0 in | Wt 183.4 lb

## 2021-12-27 DIAGNOSIS — E119 Type 2 diabetes mellitus without complications: Secondary | ICD-10-CM | POA: Diagnosis not present

## 2021-12-27 DIAGNOSIS — N183 Chronic kidney disease, stage 3 unspecified: Secondary | ICD-10-CM

## 2021-12-27 DIAGNOSIS — I10 Essential (primary) hypertension: Secondary | ICD-10-CM

## 2021-12-27 DIAGNOSIS — I5032 Chronic diastolic (congestive) heart failure: Secondary | ICD-10-CM

## 2021-12-27 DIAGNOSIS — E782 Mixed hyperlipidemia: Secondary | ICD-10-CM

## 2021-12-27 DIAGNOSIS — Z794 Long term (current) use of insulin: Secondary | ICD-10-CM

## 2021-12-27 NOTE — Progress Notes (Signed)
Active    Office Visit    Patient Name: Tammie [name] Date of Encounter: 12/27/2021  Primary Care Provider:  Donnamarie Rossetti, PA-C Primary Cardiologist:  Nelva Bush, MD  Chief Complaint    81 year old female with a history of chronic HFpEF, hypertension, hyperlipidemia, type 2 diabetes mellitus, stage III chronic kidney disease, obesity, and GI bleed, who presents for follow-up of heart failure.  Past Medical History    Past Medical History:  Diagnosis Date   Cancer ([place])    skin   Chronic heart failure with preserved ejection fraction (HFpEF) ([place])    a. 11/2019 Echo: EF 60-65%, no rwma, GrII DD, mild LVH, RVSP 26mHg, nl RV fxn, triv MR, mild-mod TR.   CKD (chronic kidney disease), stage III (HCC)    Diabetes mellitus without complication (HCC)    GI bleed    Hypertension    Mixed hyperlipidemia    Obesity    Pulmonary nodule    a. 03/2021 CT Chest: Stable 226mR apical pulm nodule.   Past Surgical History:  Procedure Laterality Date   ABDOMINAL HYSTERECTOMY     BREAST BIOPSY Left 2013   CORE W/CLIP - NEG   ESOPHAGOGASTRODUODENOSCOPY (EGD) WITH PROPOFOL N/A 12/23/2019   Procedure: ESOPHAGOGASTRODUODENOSCOPY (EGD) WITH PROPOFOL;  Surgeon: AnJonathon BellowsMD;  [facility];  Service: Gastroenterology;  Laterality: N/A;    Allergies  Allergies  Allergen Reactions   Ampicillin Hives   Contrast Media [Iodinated Contrast Media] Hives   Penicillin G Hives   Sulfamethoxazole-Trimethoprim Hives   Tetracyclines & Related Hives   Verapamil Hives   Amlodipine Rash    History of Present Illness    [age]ear old female with above past medical history including chronic HFrEF, hypertension, hyperlipidemia, type 2 diabetes mellitus, stage III chronic kidney disease, obesity, and GI bleed.  Previous echo in June 2021 showed an EF of 60 to 65% with grade 2 diastolic dysfunction, mild LVH, RVSP of 35 mmHg, and mild to moderate TR.  She has chronic mobility  issues in the setting of low back and knee pain.  She was last seen in cardiology clinic in January 2023, at which time she was felt to be euvolemic.  She was hypertensive and lisinopril was increased to 10 mg daily.  Renal function electrolytes were stable on follow-up lab work in late January.  Since her last visit, she has done well from a cardiac standpoint.  She is limited in the setting of unsteady gait and use of a cane but she is able to walk down her long driveway to get her mail without any symptoms or limitations.  She denies chest pain, dyspnea, palpitations, PND, orthopnea, dizziness, syncope, or early satiety.  She does have chronic, mild lower extremity swelling for which she uses torsemide 20 mg once a day.  About once a month she may take an extra torsemide due to an increase in swelling or weight gain.  Overall, she feels that swelling has been stable.  She checks her blood pressure twice a day and notes that it typically runs in the 130s.  Home Medications    Current Outpatient Medications  Medication Sig Dispense Refill   ferrous sulfate 325 (65 FE) MG tablet Take 325 mg by mouth daily with breakfast.     glimepiride (AMARYL) 2 MG tablet Take 2 mg by mouth daily with breakfast.      Insulin Degludec (TRESIBA) 100 UNIT/ML SOLN Inject 10 Units into the skin 2 (two) times daily.  levothyroxine (SYNTHROID) 75 MCG tablet Take 75 mcg by mouth daily.     lisinopril (ZESTRIL) 10 MG tablet Take 1 tablet (10 mg total) by mouth daily. 90 tablet 2   lovastatin (MEVACOR) 20 MG tablet Take 20 mg by mouth at bedtime.     MAGNESIUM OXIDE PO Take 400 mg by mouth daily.     metFORMIN (GLUCOPHAGE) 1000 MG tablet Take 1,000 mg by mouth 2 (two) times daily.     metoprolol succinate (TOPROL-XL) 25 MG 24 hr tablet TAKE 1 TABLET BY MOUTH EVERY DAY 90 tablet 3   pantoprazole (PROTONIX) 40 MG tablet Take 40 mg by mouth daily.     torsemide (DEMADEX) 20 MG tablet Take '20mg'$  (one tablet) daily. You may  take an extra tablet in the afternoon as needed for weight gain of 3 pounds overnight or 5 pounds in one week. 45 tablet 2   No current facility-administered medications for this visit.     Review of Systems    Chronic, mild lower extremity swelling.  Ambulation is limited by unsteady gait and so she is fairly sedentary.  She uses a cane to get around.  She denies chest pain, dyspnea, palpitations, PND, orthopnea, dizziness, syncope, or early satiety.  She has chronic neck stiffness and discomfort.  All other systems reviewed and are otherwise negative except as noted above.    Physical Exam    VS:  BP (!) 142/78 (BP Location: Left Arm, Patient Position: Sitting, Cuff Size: Normal)   Pulse 71   Ht '5\' 4"'$  (1.626 m)   Wt 183 lb 6.4 oz (83.2 kg)   SpO2 96%   BMI 31.48 kg/m  , BMI Body mass index is 31.48 kg/m.     Vitals:   12/27/21 1054 12/27/21 1111  BP: (!) 142/78 136/68  Pulse: 71   SpO2: 96%     GEN: Well nourished, well developed, in no acute distress. HEENT: normal. Neck: Supple, no JVD, carotid bruits, or masses. Cardiac: RRR, no murmurs, rubs, or gallops. No clubbing, cyanosis, trace bilateral lower extremity edema.  Radials/PT 2+ and equal bilaterally.  Respiratory:  Respirations regular and unlabored, clear to auscultation bilaterally. GI: Soft, nontender, nondistended, BS + x 4. MS: no deformity or atrophy. Skin: warm and dry, no rash. Neuro:  Strength and sensation are intact. Psych: Normal affect.  Accessory Clinical Findings    ECG personally reviewed by me today -regular sinus rhythm, 71, PACs, baseline artifact- no acute changes.  Lab Results  Component Value Date   WBC 6.0 06/05/2020   HGB 11.7 (L) 06/05/2020   HCT 38.7 06/05/2020   MCV 85.1 06/05/2020   PLT 222 06/05/2020   Lab Results  Component Value Date   CREATININE 1.16 (H) 07/06/2021   BUN 24 07/06/2021   NA 142 07/06/2021   K 4.1 07/06/2021   CL 98 07/06/2021   CO2 28 07/06/2021   Labs  dated August 11, 2021 from care everywhere ([facility])  Sodium 141, potassium 4.5, chloride 100, CO2 34.9, BUN 21, creatinine 1.2, glucose 100 Calcium 9.5, albumin 4.0, total protein 6.2 Total bilirubin 0.4, alkaline phosphatase 49, AST 11, ALT 7 Hemoglobin A1c 6.5 Total cholesterol 116, triglycerides 125, HDL 47.1, LDL 44 TSH 1.177  Assessment & Plan    1.  Chronic HFpEF:  Most recent echo in June 2021 showed an EF of 60 to 65% with grade 2 diastolic dysfunction and an RVSP of 35 mmHg.  Patient with chronic stable and mild lower extremity  swelling for which she uses torsemide 20 mg daily.  About once a month, she will take an additional dose.  Activity is limited but she does not experience dyspnea on exertion with walking to her mailbox each day, which she notes is a pretty good distance.  Blood pressure initially elevated at 142/78 though slightly better on repeat at 136/68.  She says pressures at home are typically in the 130s.  Continue current doses of beta-blocker, ACE inhibitor, and torsemide therapy.  Lab work in March showed stable renal function and electrolytes.  2.  Essential hypertension: Blood pressures have been trending in the 130s at home.  I did repeat her blood pressure today after an initial reading of 142/78, and got 136/68.  With a goal systolic of less than 092, we did discuss potentially increasing her lisinopril to 20 mg daily however, she would prefer to continue on the same dose.  3.  Hyperlipidemia: Patient is on lovastatin therapy with an LDL of 44 in March.  LFTs were normal at that time.  4.  Type 2 diabetes mellitus: Managed with metformin, insulin, and Amaryl.  A1c was 6.5 in March.  Followed by primary care.  If not cost prohibitive, she would likely derive benefit from [place]ce therapy.  Will defer to her primary care provider.  5.  Stage III chronic kidney disease: Creatinine stable at 1.2 in March.  She remains on ACE inhibitor therapy.  6.  Disposition: Follow-up  in 6 months or sooner if necessary.   Murray Hodgkins, NP 12/27/2021, 11:09 AM

## 2021-12-27 NOTE — Patient Instructions (Signed)
Medication Instructions:   Your physician recommends that you continue on your current medications as directed. Please refer to the Current Medication list given to you today.  *If you need a refill on your cardiac medications before your next appointment, please call your pharmacy*   Lab Work:  None ordered  Testing/Procedures:  None ordered   Follow-Up: At [facility], you and your health needs are our priority.  As part of our continuing mission to provide you with exceptional heart care, we have created designated Provider Care Teams.  These Care Teams include your primary Cardiologist (physician) and Advanced Practice Providers (APPs -  Physician Assistants and Nurse Practitioners) who all work together to provide you with the care you need, when you need it.  We recommend signing up for the patient portal called "MyChart".  Sign up information is provided on this After Visit Summary.  MyChart is used to connect with patients for Virtual Visits (Telemedicine).  Patients are able to view lab/test results, encounter notes, upcoming appointments, etc.  Non-urgent messages can be sent to your provider as well.   To learn more about what you can do with MyChart, go to [email or web address].    Your next appointment:   6 month(s)  The format for your next appointment:   In Person  Provider:   Nelva Bush, MD{   Important Information About Sugar

## 2022-01-26 ENCOUNTER — Other Ambulatory Visit: Payer: Self-pay | Admitting: Internal Medicine

## 2022-01-26 DIAGNOSIS — I10 Essential (primary) hypertension: Secondary | ICD-10-CM

## 2022-01-26 DIAGNOSIS — I5032 Chronic diastolic (congestive) heart failure: Secondary | ICD-10-CM

## 2022-01-26 DIAGNOSIS — I493 Ventricular premature depolarization: Secondary | ICD-10-CM

## 2022-03-07 ENCOUNTER — Other Ambulatory Visit: Payer: Self-pay | Admitting: Internal Medicine

## 2022-04-24 ENCOUNTER — Other Ambulatory Visit: Payer: Self-pay | Admitting: Internal Medicine

## 2022-07-13 ENCOUNTER — Encounter: Payer: Self-pay | Admitting: Nurse Practitioner

## 2022-07-13 ENCOUNTER — Ambulatory Visit: Payer: Medicare HMO | Attending: Nurse Practitioner | Admitting: Nurse Practitioner

## 2022-07-13 VITALS — BP 150/78 | HR 75 | Ht 64.0 in | Wt 187.0 lb

## 2022-07-13 DIAGNOSIS — Z794 Long term (current) use of insulin: Secondary | ICD-10-CM

## 2022-07-13 DIAGNOSIS — I10 Essential (primary) hypertension: Secondary | ICD-10-CM

## 2022-07-13 DIAGNOSIS — N183 Chronic kidney disease, stage 3 unspecified: Secondary | ICD-10-CM

## 2022-07-13 DIAGNOSIS — I5032 Chronic diastolic (congestive) heart failure: Secondary | ICD-10-CM

## 2022-07-13 DIAGNOSIS — E782 Mixed hyperlipidemia: Secondary | ICD-10-CM

## 2022-07-13 DIAGNOSIS — E119 Type 2 diabetes mellitus without complications: Secondary | ICD-10-CM

## 2022-07-13 MED ORDER — METOPROLOL SUCCINATE ER 25 MG PO TB24: 25.0000 mg | ORAL_TABLET | Freq: Every day | ORAL | 3 refills | Status: DC

## 2022-07-13 MED ORDER — LISINOPRIL 20 MG PO TABS: 10.0000 mg | ORAL_TABLET | Freq: Every day | ORAL | 3 refills | Status: AC

## 2022-07-13 NOTE — Progress Notes (Signed)
Office Visit    Patient Name: Tammie [name] Date of Encounter: 07/13/2022  Primary Care Provider:  Donnamarie Rossetti, PA-C Primary Cardiologist:  Nelva Bush, MD  Chief Complaint    82 y/o ? w/ a h/o chronic HFpEF, HTN, HL, DMII, CKD III, obesity, and GIB, who presents for heart failure follow-up.  Past Medical History    Past Medical History:  Diagnosis Date   Cancer ([place])    skin   Chronic heart failure with preserved ejection fraction (HFpEF) ([name])    a. 11/2019 Echo: EF 60-65%, no rwma, GrII DD, mild LVH, RVSP 61mHg, nl RV fxn, triv MR, mild-mod TR.   CKD (chronic kidney disease), stage III (HCC)    Diabetes mellitus without complication (HCC)    GI bleed    Hypertension    Mixed hyperlipidemia    Obesity    Pulmonary nodule    a. 03/2021 CT Chest: Stable 230mR apical pulm nodule.   Past Surgical History:  Procedure Laterality Date   ABDOMINAL HYSTERECTOMY     BREAST BIOPSY Left 2013   CORE W/CLIP - NEG   ESOPHAGOGASTRODUODENOSCOPY (EGD) WITH PROPOFOL N/A 12/23/2019   Procedure: ESOPHAGOGASTRODUODENOSCOPY (EGD) WITH PROPOFOL;  Surgeon: AnJonathon BellowsMD;  [facility];  Service: Gastroenterology;  Laterality: N/A;    Allergies  Allergies  Allergen Reactions   Ampicillin Hives   Contrast Media [Iodinated Contrast Media] Hives   Penicillin G Hives   Sulfamethoxazole-Trimethoprim Hives   Tetracyclines & Related Hives   Verapamil Hives   Amlodipine Rash    History of Present Illness    [age]/o ? w/ a h/o chronic HFpEF, HTN, HL, DMII, CKD III, obesity, and GIB.  Previous echo in June 2021, showed an EF of 60-65% with grade 2 diastolic dysfunction, mild LVH, RVSP of 35 mmHg, and mild to moderate TR.  She has chronic mobility issues in the setting of low back pain and knee pain.  She was last seen in cardiology clinic in July 2023, at which time she was doing well, only requiring an extra dose of torsemide about once a month due to mild  swelling or weight gain.  Blood pressure was mildly elevated and we discussed potentially increasing her lisinopril however she preferred to remain on the same dose (10 mg daily).  Since her last visit, [name] has generally done well.  She cont to walk to and from her mailbox daily and does not experience chest pain or dyspnea.  She weighs daily and typically runs between 183 and 185 pounds.  She takes torsemide 20 mg daily and uses an additional dose in the afternoon about once every 2 to 3 weeks, and increase over the past 6 months.  She checks her blood pressure at home twice a day and this typically trends in the upper 130s to 140 range.  She denies palpitations, PND, orthopnea, dizziness, syncope, or early satiety.  She does sometimes have mild ankle swelling, which would prompt her to take an additional torsemide.  Home Medications    Current Outpatient Medications  Medication Sig Dispense Refill   ferrous sulfate 325 (65 FE) MG tablet Take 325 mg by mouth daily with breakfast.     glimepiride (AMARYL) 2 MG tablet Take 2 mg by mouth daily with breakfast.      Insulin Degludec (TRESIBA) 100 UNIT/ML SOLN Inject 10 Units into the skin 2 (two) times daily.     levothyroxine (SYNTHROID) 75 MCG tablet Take 75  mcg by mouth daily.     lovastatin (MEVACOR) 20 MG tablet Take 20 mg by mouth at bedtime.     MAGNESIUM OXIDE PO Take 400 mg by mouth daily.     metFORMIN (GLUCOPHAGE) 1000 MG tablet Take 1,000 mg by mouth 2 (two) times daily.     pantoprazole (PROTONIX) 40 MG tablet Take 40 mg by mouth daily.     torsemide (DEMADEX) 20 MG tablet Take '20mg'$  (one tablet) daily. You may take an extra tablet in the afternoon as needed for weight gain of 3 pounds overnight or 5 pounds in one week. 45 tablet 2   lisinopril (ZESTRIL) 20 MG tablet Take 0.5 tablets (10 mg total) by mouth daily. 90 tablet 3   metoprolol succinate (TOPROL-XL) 25 MG 24 hr tablet Take 1 tablet (25 mg total) by mouth daily. 90  tablet 3   No current facility-administered medications for this visit.     Review of Systems    Occasional mild ankle swelling.  She denies chest pain, dyspnea, palpitations, PND, orthopnea, dizziness, syncope, or early satiety.  All other systems reviewed and are otherwise negative except as noted above.    Physical Exam    VS:  BP (!) 145/75 (BP Location: Left Arm, Patient Position: Sitting, Cuff Size: Normal)   Pulse 75   Ht '5\' 4"'$  (1.626 m)   Wt 187 lb (84.8 kg)   BMI 32.10 kg/m  , BMI Body mass index is 32.1 kg/m.     Vitals:   07/13/22 1426 07/13/22 1526  BP: (!) 145/75 (!) 150/78  Pulse: 75     GEN: Well nourished, well developed, in no acute distress. HEENT: normal. Neck: Supple, no JVD, carotid bruits, or masses. Cardiac: RRR, no murmurs, rubs, or gallops. No clubbing, cyanosis, trace to 1+ edema.  Radials 2+/PT 2+ and equal bilaterally.  Respiratory:  Respirations regular and unlabored, clear to auscultation bilaterally. GI: Soft, nontender, nondistended, BS + x 4. MS: no deformity or atrophy. Skin: warm and dry, no rash. Neuro:  Strength and sensation are intact. Psych: Normal affect.  Accessory Clinical Findings    ECG personally reviewed by me today - RSR, 74 - no acute changes.  Labs dated February 14, 2022 from care everywhere:  Hemoglobin 13.3, hematocrit 41.0, WBC 7.3, platelets of 316 Sodium 143, potassium 4.6, chloride 102, CO2 32.9, BUN 22, creatinine 1.2, glucose 109 Total bilirubin 0.4, alkaline phosphatase 48, AST 11, ALT 6 Total protein 6.5, albumin 4.2, calcium 9.8 Hemoglobin A1c 6.6 Total cholesterol 104, triglycerides 113, HDL 44.9, LDL 37 TSH 0.772  Assessment & Plan    1.  Chronic HFpEF: Echo in June 2021 showed an EF of 60 to 65% with grade 2 diastolic dysfunction and RVSP of 35 mmHg.  She has been relatively stable over the past 6 months, using torsemide 20 mg daily with an additional tablet about once every 2 to 3 weeks.  She has  trace to 1+ lower extremity edema today, which she notes is slightly more than her usual baseline and today is a day that she would typically take an additional torsemide in the afternoon.  Her blood pressure is again elevated and she agreed to allow me to increase lisinopril to 20 mg daily.  She has plans for follow-up lab work with her primary care provider in the next 2 weeks.  She otherwise remains on beta-blocker and torsemide therapy.  I did encourage her to use an objective measure of such as weight to  determine whether or not she takes additional torsemide in the afternoons.  We discussed that her dry weight appears to be 183 pounds and therefore, she will take an additional torsemide when weight is 185 or greater.  We did briefly discuss the addition of Jardiance in patients with HFpEF.  She plans to discuss this further with her primary care provider given her history of diabetes, as she is not currently interested in changing or adding to her diabetes regimen.  2.  Essential hypertension: Blood pressure elevated today.  Increasing lisinopril to 20 mg daily.  She will have follow-up basic metabolic panel with primary care in about 2 weeks.  3.  Hyperlipidemia: She remains on lovastatin therapy with an LDL of 37 in September 2023.  4.  Type 2 diabetes mellitus: A1c 6.6 in September.  Managed by primary care.  5.  Stage III chronic kidney disease: Creatinine stable at 1.2 in September.  Plan for follow-up labs in approximately 2 weeks in the setting of lisinopril titration.  6.  Disposition: Follow-up basic metabolic panel in 2 weeks-patient plans to do through primary care office.  Follow-up in clinic in 6 months or sooner if necessary.  Murray Hodgkins, NP 07/13/2022, 3:26 PM

## 2022-07-13 NOTE — Patient Instructions (Signed)
Medication Instructions:   INCREASE Lisinopril - take one tablet (20 mg) by mouth daily.   *If you need a refill on your cardiac medications before your next appointment, please call your pharmacy*   Lab Work:  None Ordered  If you have labs (blood work) drawn today and your tests are completely normal, you will receive your results only by: [place] (if you have MyChart) OR A paper copy in the mail If you have any lab test that is abnormal or we need to change your treatment, we will call you to review the results.   Testing/Procedures:  None Ordered    Follow-Up: At [facility], you and your health needs are our priority.  As part of our continuing mission to provide you with exceptional heart care, we have created designated Provider Care Teams.  These Care Teams include your primary Cardiologist (physician) and Advanced Practice Providers (APPs -  Physician Assistants and Nurse Practitioners) who all work together to provide you with the care you need, when you need it.  We recommend signing up for the patient portal called "MyChart".  Sign up information is provided on this After Visit Summary.  MyChart is used to connect with patients for Virtual Visits (Telemedicine).  Patients are able to view lab/test results, encounter notes, upcoming appointments, etc.  Non-urgent messages can be sent to your provider as well.   To learn more about what you can do with MyChart, go to [email or web address].    Your next appointment:   6 month(s)  Provider:   Nelva Bush, MD

## 2022-07-23 ENCOUNTER — Other Ambulatory Visit: Payer: Self-pay | Admitting: Nurse Practitioner

## 2022-07-23 DIAGNOSIS — I5032 Chronic diastolic (congestive) heart failure: Secondary | ICD-10-CM

## 2022-07-23 DIAGNOSIS — I10 Essential (primary) hypertension: Secondary | ICD-10-CM

## 2022-07-24 ENCOUNTER — Other Ambulatory Visit: Payer: Self-pay | Admitting: Nurse Practitioner

## 2022-07-24 DIAGNOSIS — I10 Essential (primary) hypertension: Secondary | ICD-10-CM

## 2022-07-24 DIAGNOSIS — I5032 Chronic diastolic (congestive) heart failure: Secondary | ICD-10-CM

## 2023-07-13 ENCOUNTER — Other Ambulatory Visit: Payer: Self-pay | Admitting: Nurse Practitioner

## 2023-07-13 DIAGNOSIS — I10 Essential (primary) hypertension: Secondary | ICD-10-CM

## 2023-07-13 DIAGNOSIS — I5032 Chronic diastolic (congestive) heart failure: Secondary | ICD-10-CM

## 2023-09-04 ENCOUNTER — Encounter: Payer: Self-pay | Admitting: Nurse Practitioner

## 2023-09-04 ENCOUNTER — Ambulatory Visit: Payer: Medicare HMO | Attending: Nurse Practitioner | Admitting: Nurse Practitioner

## 2023-09-04 VITALS — BP 132/60 | HR 76 | Ht 64.0 in | Wt 188.8 lb

## 2023-09-04 DIAGNOSIS — E782 Mixed hyperlipidemia: Secondary | ICD-10-CM

## 2023-09-04 DIAGNOSIS — E119 Type 2 diabetes mellitus without complications: Secondary | ICD-10-CM

## 2023-09-04 DIAGNOSIS — I5032 Chronic diastolic (congestive) heart failure: Secondary | ICD-10-CM | POA: Diagnosis not present

## 2023-09-04 DIAGNOSIS — I10 Essential (primary) hypertension: Secondary | ICD-10-CM | POA: Diagnosis not present

## 2023-09-04 DIAGNOSIS — N183 Chronic kidney disease, stage 3 unspecified: Secondary | ICD-10-CM

## 2023-09-04 DIAGNOSIS — Z794 Long term (current) use of insulin: Secondary | ICD-10-CM

## 2023-09-04 NOTE — Patient Instructions (Signed)
Medication Instructions:  No changes *If you need a refill on your cardiac medications before your next appointment, please call your pharmacy*   Lab Work: None ordered If you have labs (blood work) drawn today and your tests are completely normal, you will receive your results only by: MyChart Message (if you have MyChart) OR A paper copy in the mail If you have any lab test that is abnormal or we need to change your treatment, we will call you to review the results.   Testing/Procedures: None ordered   Follow-Up: At [facility], you and your health needs are our priority.  As part of our continuing mission to provide you with exceptional heart care, we have created designated Provider Care Teams.  These Care Teams include your primary Cardiologist (physician) and Advanced Practice Providers (APPs -  Physician Assistants and Nurse Practitioners) who all work together to provide you with the care you need, when you need it.  We recommend signing up for the patient portal called "MyChart".  Sign up information is provided on this After Visit Summary.  MyChart is used to connect with patients for Virtual Visits (Telemedicine).  Patients are able to view lab/test results, encounter notes, upcoming appointments, etc.  Non-urgent messages can be sent to your provider as well.   To learn more about what you can do with MyChart, go to [email or web address].    Your next appointment:   12 month(s)  Provider:   You may see Yvonne Kendall, MD or one of the following Advanced Practice Providers on your designated Care Team:   Nicolasa Ducking, NP

## 2023-09-04 NOTE — Progress Notes (Signed)
 Office Visit    Patient Name: Tammie [name] Date of Encounter: 09/04/2023  Primary Care Provider:  Wilford Corner, PA-C Primary Cardiologist:  Yvonne Kendall, MD  Chief Complaint    83 y.o. female with a history of chronic HFpEF, hypertension, hyperlipidemia, type 2 diabetes mellitus, stage III chronic kidney disease, obesity, and GI bleed, who presents for follow-up of heart failure.   Past Medical History  Subjective   Past Medical History:  Diagnosis Date   Cancer (HCC)    skin   Chronic heart failure with preserved ejection fraction (HFpEF) (HCC)    a. 11/2019 Echo: EF 60-65%, no rwma, GrII DD, mild LVH, RVSP , nl RV fxn, triv MR, mild-mod TR.   CKD (chronic kidney disease), stage III (HCC)    Diabetes mellitus without complication (HCC)    GI bleed    Hypertension    Mixed hyperlipidemia    Obesity    Pulmonary nodule    a. 03/2021 CT Chest: Stable 2mm R apical pulm nodule.   Past Surgical History:  Procedure Laterality Date   ABDOMINAL HYSTERECTOMY     BREAST BIOPSY Left 2013   CORE W/CLIP - NEG   ESOPHAGOGASTRODUODENOSCOPY (EGD) WITH PROPOFOL N/A 12/23/2019   Procedure: ESOPHAGOGASTRODUODENOSCOPY (EGD) WITH PROPOFOL;  Surgeon: Wyline Mood, MD;  Location: [facility] ENDOSCOPY;  Service: Gastroenterology;  Laterality: N/A;    Allergies  Allergies  Allergen Reactions   Ampicillin Hives   Contrast Media [Iodinated Contrast Media] Hives   Penicillin G Hives   Sulfamethoxazole-Trimethoprim Hives   Tetracyclines & Related Hives   Verapamil Hives   Amlodipine Rash      History of Present Illness      83 y.o. y/o female w/ a h/o chronic HFpEF, HTN, HL, DMII, CKD III, obesity, and GIB. Previous echo in June 2021, showed an EF of 60-65% with grade 2 diastolic dysfunction, mild LVH, RVSP of 35 mmHg, and mild to moderate TR. She has chronic mobility issues in the setting of low back pain and knee pain.    She was last seen in cardiology clinic in  February of 2024, at which time she was doing well, only requiring an extra dose of torsemide about once every 2 to 3 weeks due to mild swelling or weight gain. Blood pressure was mildly elevated at office visit back in February and lisinopril was increased to 20mg .   Since her last visit, she has done well from a cardiac standpoint. Labs were taken at her primary care office last week and were at baseline. She lives alone but functions well with a cane. She is able to walk down to her mailbox without any symptoms of limitations. She denies chest pain, dyspnea, palpitations, PND, orthopnea, dizziness, syncope, or early satiety. She does have chronic, mild lower extremity swelling for which she uses torsemide 20 mg once a day. She reports not requiring extra torsemide frequently. Overall, she feels that ankle swelling has been baseline. She checks her blood pressure twice a day at home and it stays within the 130s.   Objective  Home Medications    Current Outpatient Medications  Medication Sig Dispense Refill   ferrous sulfate 325 (65 FE) MG tablet Take 325 mg by mouth daily with breakfast.     glimepiride (AMARYL) 2 MG tablet Take 2 mg by mouth daily with breakfast.      Insulin Degludec (TRESIBA) 100 UNIT/ML SOLN Inject 10 Units into the skin 2 (two) times daily.  levothyroxine (SYNTHROID) 75 MCG tablet Take 75 mcg by mouth daily.     lisinopril (ZESTRIL) 20 MG tablet Take 0.5 tablets (10 mg total) by mouth daily. 90 tablet 3   lovastatin (MEVACOR) 20 MG tablet Take 20 mg by mouth at bedtime.     MAGNESIUM OXIDE PO Take 400 mg by mouth daily.     metFORMIN (GLUCOPHAGE) 1000 MG tablet Take 1,000 mg by mouth 2 (two) times daily.     metoprolol succinate (TOPROL-XL) 25 MG 24 hr tablet TAKE 1 TABLET DAILY 90 tablet 3   pantoprazole (PROTONIX) 40 MG tablet Take 40 mg by mouth daily.     torsemide (DEMADEX) 20 MG tablet Take 20mg  (one tablet) daily. You may take an extra tablet in the afternoon as  needed for weight gain of 3 pounds overnight or 5 pounds in one week. 45 tablet 2   No current facility-administered medications for this visit.     Physical Exam    VS:  BP 132/60   Pulse 76   Ht 5\' 4"  (1.626 m)   Wt 188 lb 12.8 oz (85.6 kg)   SpO2 96%   BMI 32.41 kg/m  , BMI Body mass index is 32.41 kg/m.       GEN: Well nourished, well developed, in no acute distress. HEENT: normal. Neck: Supple, no JVD, carotid bruits, or masses. Cardiac: RRR, no murmurs, rubs, or gallops. No clubbing, cyanosis, trace to 1+ edema .  Radials 2+/PT 2+ and equal bilaterally.  Respiratory:  Respirations regular and unlabored, clear to auscultation bilaterally. GI: Soft, nontender, nondistended, BS + x 4. MS: no deformity or atrophy. Skin: warm and dry, no rash. Neuro:  Strength and sensation are intact. Psych: Normal affect.  Accessory Clinical Findings    ECG personally reviewed by me today - EKG Interpretation Date/Time:  Tuesday September 04 2023 11:13:58 EDT Ventricular Rate:  76 PR Interval:  144 QRS Duration:  80 QT Interval:  360 QTC Calculation: 405 R Axis:   55  Text Interpretation: Sinus rhythm with Premature atrial complexes Confirmed by Nicolasa Ducking [phone]) on 09/04/2023 11:20:40 AM   Labs dated February 20, 2023 from Care Everywhere:  Hemoglobin 12.9, hematocrit 41.1, WBC 7.8, platelets 314 Total cholesterol 128, triglycerides 161, HDL 48.9, LDL 47  Labs dated August 23, 2023 from [facility] Everywhere:  Hemoglobin A1c 6.7 Sodium 142, potassium 4.5, chloride 101, CO2 33.2, BUN 22, creatinine 1.2, glucose 97 Calcium 9.6, albumin 4.3, total protein 6.8 Total bilirubin 0.4, alkaline phosphatase 47, AST 12, ALT 8    Assessment & Plan    1.  Chronic HFpEF: Echo in June 2021 showed an EF of 60 to 65% with grade 2 diastolic dysfunction and RVSP of 35 mmHg. She has been stable over the past year. She takes torsemide 20 mg daily with an additional tablet rarely. She does have  trace 1+ lower extremity edema which is at her baseline. She weighs daily at home, and this has been stable.  HR/BP stable.  Cont current meds.  2. Essential Hypertension: BP 132/60. BMP was taken with primary care last week and was stable. Continue Lisinopril.   3. Hyperlipidemia: She remains on therapy with an LDL of 47 in September 2024.  4. Type 2 diabetes mellitus: A1c 6.7 in March 2025. Managed by primary care.   5. Stage III chronic kidney disease: Creatinine stable at 1.2 in March 2025. Continue current medication regimen.  6. Disposition: Follow-up in clinic in 12 months or sooner  if necessary.   Nicolasa Ducking, NP 09/04/2023, 12:43 PM

## 2024-07-14 ENCOUNTER — Other Ambulatory Visit: Payer: Self-pay | Admitting: Nurse Practitioner

## 2024-07-14 DIAGNOSIS — I5032 Chronic diastolic (congestive) heart failure: Secondary | ICD-10-CM

## 2024-07-14 DIAGNOSIS — I10 Essential (primary) hypertension: Secondary | ICD-10-CM
